# Patient Record
Sex: Female | Born: 1990 | Hispanic: No | Marital: Married
Health system: Northeastern US, Academic
[De-identification: ages and names within clinical notes are randomized; demographics above are authoritative.]

## PROBLEM LIST (undated history)

## (undated) DIAGNOSIS — E88819 Insulin resistance, unspecified: Secondary | ICD-10-CM

## (undated) DIAGNOSIS — E119 Type 2 diabetes mellitus without complications: Secondary | ICD-10-CM

## (undated) DIAGNOSIS — T1491XA Suicide attempt, initial encounter: Secondary | ICD-10-CM

## (undated) DIAGNOSIS — F4310 Post-traumatic stress disorder, unspecified: Secondary | ICD-10-CM

## (undated) DIAGNOSIS — R569 Unspecified convulsions: Secondary | ICD-10-CM

## (undated) DIAGNOSIS — N96 Recurrent pregnancy loss: Secondary | ICD-10-CM

## (undated) DIAGNOSIS — T7411XA Adult physical abuse, confirmed, initial encounter: Secondary | ICD-10-CM

## (undated) DIAGNOSIS — E785 Hyperlipidemia, unspecified: Secondary | ICD-10-CM

## (undated) DIAGNOSIS — R011 Cardiac murmur, unspecified: Secondary | ICD-10-CM

## (undated) DIAGNOSIS — F603 Borderline personality disorder: Secondary | ICD-10-CM

## (undated) DIAGNOSIS — J45909 Unspecified asthma, uncomplicated: Secondary | ICD-10-CM

## (undated) DIAGNOSIS — F32A Depression, unspecified: Secondary | ICD-10-CM

## (undated) DIAGNOSIS — E282 Polycystic ovarian syndrome: Secondary | ICD-10-CM

## (undated) DIAGNOSIS — I10 Essential (primary) hypertension: Secondary | ICD-10-CM

## (undated) DIAGNOSIS — F419 Anxiety disorder, unspecified: Secondary | ICD-10-CM

## (undated) DIAGNOSIS — E8881 Metabolic syndrome: Secondary | ICD-10-CM

## (undated) HISTORY — DX: Depression, unspecified: F32.A

## (undated) HISTORY — DX: Polycystic ovarian syndrome: E28.2

## (undated) HISTORY — DX: Unspecified asthma, uncomplicated: J45.909

## (undated) HISTORY — PX: NO PAST SURGERIES: SHX2092

## (undated) HISTORY — DX: Hyperlipidemia, unspecified: E78.5

## (undated) HISTORY — DX: Recurrent pregnancy loss: N96

## (undated) HISTORY — DX: Anxiety disorder, unspecified: F41.9

## (undated) HISTORY — DX: Insulin resistance, unspecified: E88.819

## (undated) HISTORY — DX: Unspecified convulsions: R56.9

## (undated) HISTORY — DX: Type 2 diabetes mellitus without complications: E11.9

## (undated) HISTORY — DX: Suicide attempt, initial encounter: T14.91XA

## (undated) HISTORY — DX: Borderline personality disorder: F60.3

## (undated) HISTORY — DX: Adult physical abuse, confirmed, initial encounter: T74.11XA

## (undated) HISTORY — DX: Cardiac murmur, unspecified: R01.1

## (undated) HISTORY — DX: Post-traumatic stress disorder, unspecified: F43.10

## (undated) HISTORY — DX: Metabolic syndrome: E88.81

---

## 2013-07-23 DIAGNOSIS — Z8742 Personal history of other diseases of the female genital tract: Secondary | ICD-10-CM | POA: Insufficient documentation

## 2016-02-24 DIAGNOSIS — E88819 Insulin resistance, unspecified: Secondary | ICD-10-CM | POA: Insufficient documentation

## 2016-02-24 DIAGNOSIS — E781 Pure hyperglyceridemia: Secondary | ICD-10-CM | POA: Insufficient documentation

## 2016-02-24 DIAGNOSIS — E8881 Metabolic syndrome: Secondary | ICD-10-CM | POA: Insufficient documentation

## 2016-08-21 DIAGNOSIS — F331 Major depressive disorder, recurrent, moderate: Secondary | ICD-10-CM | POA: Insufficient documentation

## 2016-08-23 DIAGNOSIS — F332 Major depressive disorder, recurrent severe without psychotic features: Secondary | ICD-10-CM

## 2016-08-23 DIAGNOSIS — F419 Anxiety disorder, unspecified: Secondary | ICD-10-CM | POA: Insufficient documentation

## 2016-08-23 DIAGNOSIS — F32A Depression, unspecified: Secondary | ICD-10-CM | POA: Insufficient documentation

## 2016-08-23 DIAGNOSIS — F449 Dissociative and conversion disorder, unspecified: Secondary | ICD-10-CM | POA: Insufficient documentation

## 2016-08-23 HISTORY — DX: Major depressive disorder, recurrent severe without psychotic features: F33.2

## 2017-05-17 DIAGNOSIS — N970 Female infertility associated with anovulation: Secondary | ICD-10-CM | POA: Insufficient documentation

## 2018-08-12 DIAGNOSIS — E282 Polycystic ovarian syndrome: Secondary | ICD-10-CM | POA: Insufficient documentation

## 2018-08-12 DIAGNOSIS — K0889 Other specified disorders of teeth and supporting structures: Secondary | ICD-10-CM | POA: Insufficient documentation

## 2018-08-12 DIAGNOSIS — E119 Type 2 diabetes mellitus without complications: Secondary | ICD-10-CM | POA: Insufficient documentation

## 2018-08-12 DIAGNOSIS — A419 Sepsis, unspecified organism: Secondary | ICD-10-CM

## 2018-08-12 DIAGNOSIS — R221 Localized swelling, mass and lump, neck: Secondary | ICD-10-CM | POA: Insufficient documentation

## 2018-08-12 HISTORY — DX: Sepsis, unspecified organism: A41.9

## 2018-11-06 ENCOUNTER — Encounter: Payer: Self-pay | Admitting: Emergency Medicine

## 2018-11-06 ENCOUNTER — Other Ambulatory Visit: Payer: Self-pay

## 2018-11-06 ENCOUNTER — Emergency Department
Admission: EM | Admit: 2018-11-06 | Discharge: 2018-11-06 | Disposition: A | Discharge status: Home / Self Care | Payer: Self-pay | Attending: Emergency Medicine | Admitting: Emergency Medicine

## 2018-11-06 ENCOUNTER — Emergency Department: Payer: Self-pay

## 2018-11-06 DIAGNOSIS — I10 Essential (primary) hypertension: Secondary | ICD-10-CM | POA: Insufficient documentation

## 2018-11-06 DIAGNOSIS — R42 Dizziness and giddiness: Secondary | ICD-10-CM | POA: Insufficient documentation

## 2018-11-06 DIAGNOSIS — R0600 Dyspnea, unspecified: Secondary | ICD-10-CM | POA: Insufficient documentation

## 2018-11-06 HISTORY — DX: Essential (primary) hypertension: I10

## 2018-11-06 LAB — URINALYSIS, COMPLETE (UACMP) WITH MICROSCOPIC
Bacteria, UA: NONE SEEN
Bilirubin Urine: NEGATIVE
Glucose, UA: NEGATIVE mg/dL
Hgb urine dipstick: NEGATIVE
Ketones, ur: NEGATIVE mg/dL
Leukocytes,Ua: NEGATIVE
Nitrite: NEGATIVE
Protein, ur: 30 mg/dL — AB
Specific Gravity, Urine: 1.02 (ref 1.005–1.030)
pH: 8 (ref 5.0–8.0)

## 2018-11-06 LAB — CBC
HCT: 39.4 % (ref 36.0–46.0)
Hemoglobin: 13 g/dL (ref 12.0–15.0)
MCH: 27 pg (ref 26.0–34.0)
MCHC: 33 g/dL (ref 30.0–36.0)
MCV: 81.9 fL (ref 80.0–100.0)
Platelets: 412 10*3/uL — ABNORMAL HIGH (ref 150–400)
RBC: 4.81 MIL/uL (ref 3.87–5.11)
RDW: 13 % (ref 11.5–15.5)
WBC: 8.5 10*3/uL (ref 4.0–10.5)
nRBC: 0 % (ref 0.0–0.2)

## 2018-11-06 LAB — TROPONIN I (HIGH SENSITIVITY)
Troponin I (High Sensitivity): <2 ng/L (ref <18)
Troponin I (High Sensitivity): <2 ng/L (ref <18)

## 2018-11-06 LAB — BASIC METABOLIC PANEL
Anion gap: 13 (ref 5–15)
BUN: 12 mg/dL (ref 6–20)
CO2: 22 mmol/L (ref 22–32)
Calcium: 9.4 mg/dL (ref 8.9–10.3)
Chloride: 104 mmol/L (ref 98–111)
Creatinine, Ser: 0.62 mg/dL (ref 0.44–1.00)
GFR calc Af Amer: >60 mL/min (ref >60)
GFR calc non Af Amer: >60 mL/min (ref >60)
Glucose, Bld: 147 mg/dL — ABNORMAL HIGH (ref 70–99)
Potassium: 3.5 mmol/L (ref 3.5–5.1)
Sodium: 139 mmol/L (ref 135–145)

## 2018-11-06 LAB — POCT PREGNANCY, URINE: Preg Test, Ur: NEGATIVE

## 2018-11-06 MED ORDER — LORAZEPAM 1 MG PO TABS: 1.0000 mg | ORAL_TABLET | Freq: Two times a day (BID) | ORAL | 0 refills | Status: AC

## 2018-11-06 NOTE — ED Provider Notes (Signed)
[facility] Emergency Department Provider Note       Time seen: ----------------------------------------- 5:27 PM on 11/06/2018 -----------------------------------------   I have reviewed the triage vital signs and the nursing notes.  HISTORY   Chief Complaint Shortness of Breath    HPI Monique Forbes is a 28 y.o. female with a history of hypertension who presents to the ED for shortness of breath, dizziness and lightheadedness for the past 3 days.  Patient is worried that she may have heart disease because of a family history.  She arrives in no acute distress with no pain.  Past Medical History:  Diagnosis Date  . Hypertension     There are no active problems to display for this patient.   History reviewed. No pertinent surgical history.  Allergies Patient has no known allergies.  Social History Social History   Tobacco Use  . Smoking status: Never Smoker  . Smokeless tobacco: Never Used  Substance Use Topics  . Alcohol use: Never    Frequency: Never  . Drug use: Never   Review of Systems Constitutional: Negative for fever. Cardiovascular: Negative for chest pain. Respiratory: Positive for shortness of breath Gastrointestinal: Negative for abdominal pain, vomiting and diarrhea. Musculoskeletal: Negative for back pain. Skin: Negative for rash. Neurological: Negative for headaches, focal weakness or numbness.  Positive for dizziness  All systems negative/normal/unremarkable except as stated in the HPI  ____________________________________________   PHYSICAL EXAM:  VITAL SIGNS: ED Triage Vitals  Enc Vitals Group     BP 11/06/18 1613 (!) 151/88     Pulse Rate 11/06/18 1613 (!) 106     Resp 11/06/18 1613 16     Temp 11/06/18 1613 98.7 F (37.1 C)     Temp Source 11/06/18 1613 Oral     SpO2 11/06/18 1613 98 %     Weight 11/06/18 1609 201 lb (91.2 kg)     Height 11/06/18 1609 5\' 5"  (1.651 m)     Head Circumference --       Peak Flow --      Pain Score 11/06/18 1609 0     Pain Loc --      Pain Edu? --      Excl. in [place]? --    Constitutional: Alert and oriented. Well appearing and in no distress. Eyes: Conjunctivae are normal. Normal extraocular movements. Cardiovascular: Normal rate, regular rhythm. No murmurs, rubs, or gallops. Respiratory: Normal respiratory effort without tachypnea nor retractions. Breath sounds are clear and equal bilaterally. No wheezes/rales/rhonchi. Gastrointestinal: Soft and nontender. Normal bowel sounds Musculoskeletal: Nontender with normal range of motion in extremities. No lower extremity tenderness nor edema. Neurologic:  Normal speech and language. No gross focal neurologic deficits are appreciated.  Skin:  Skin is warm, dry and intact. No rash noted. Psychiatric: Mood and affect are normal. Speech and behavior are normal.  ____________________________________________  EKG: Interpreted by me.  Sinus tachycardia with a rate of 101 bpm, normal PR interval, normal QRS, normal QT, normal axis  ____________________________________________  ED COURSE:  As part of my medical decision making, I reviewed the following data within the electronic MEDICAL RECORD NUMBER History obtained from family if available, nursing notes, old chart and ekg, as well as notes from prior ED visits. Patient presented for shortness of breath, dizziness and lightheadedness, we will assess with labs and imaging as indicated at this time.   Procedures  Monique Forbes was evaluated in Emergency Department on 11/06/2018 for the symptoms described in the history of  present illness. She was evaluated in the context of the global COVID-19 pandemic, which necessitated consideration that the patient might be at risk for infection with the SARS-CoV-2 virus that causes COVID-19. Institutional protocols and algorithms that pertain to the evaluation of patients at risk for COVID-19 are in a state of rapid change based on  information released by regulatory bodies including the CDC and federal and state organizations. These policies and algorithms were followed during the patient's care in the ED.  ____________________________________________   LABS (pertinent positives/negatives)  Labs Reviewed  BASIC METABOLIC PANEL - Abnormal; Notable for the following components:      Result Value   Glucose, Bld 147 (*)    All other components within normal limits  CBC - Abnormal; Notable for the following components:   Platelets 412 (*)    All other components within normal limits  URINALYSIS, COMPLETE (UACMP) WITH MICROSCOPIC  POC URINE PREG, ED  POCT PREGNANCY, URINE  CBG MONITORING, ED  TROPONIN I (HIGH SENSITIVITY)  TROPONIN I (HIGH SENSITIVITY)    RADIOLOGY Images were viewed by me  Chest x-ray IMPRESSION:  No acute cardiopulmonary process.  ____________________________________________   DIFFERENTIAL DIAGNOSIS   Anxiety, PE, pneumothorax, anemia, dehydration, coronary artery disease, GERD  FINAL ASSESSMENT AND PLAN  Dizziness, dyspnea   Plan: The patient had presented for shortness of breath, dizziness and lightheadedness for 3 days. Patient's labs are reassuring. Patient's imaging did not reveal any acute process.  No clear etiology for her symptoms, she is cleared for outpatient follow-up.   Laurence Aly, MD    Note: This note was generated in part or whole with voice recognition software. Voice recognition is usually quite accurate but there are transcription errors that can and very often do occur. I apologize for any typographical errors that were not detected and corrected.     Earleen Newport, MD 11/06/18 [phone]

## 2018-11-06 NOTE — ED Triage Notes (Signed)
Pt in via POV, reports shortness of breath, dizziness, light headedness x 3 days.  Pt concerned due to early onset heart disease in mother and sister, both of which died at 82 and 40 due to heart attack.  NAD noted at this time.

## 2018-11-06 NOTE — ED Notes (Signed)
Pt alert and oriented X4, cooperative, RR even and unlabored, color WNL. Pt in NAD. Ambulates safely. 

## 2019-02-13 ENCOUNTER — Ambulatory Visit: Payer: Self-pay | Admitting: *Deleted

## 2019-02-13 NOTE — Telephone Encounter (Signed)
Pt called stating her BP was 190/110 02/13/2019 at 0800; the reading was taken on her left upper arm with a home cuff; on 02/12/2019 it was 180/105 at 1900  and 170/100 at 1200; she takes amlodipine 2.5 mg daily; the pt says she has intermittent chest pain with the last episode was on 12./05/2018 at 1900; she describes her pain as "a tight feeling that goes to her back"; the pt says that she is not currently having chest pain;  The pt says her BP meds were prescribed by a hospital MD when she was being treated for sepsis; recommendations made per nurse triage protocol; she verbalized understanding; the pt is scheduled at [facility] 03/03/2019; will route to office for notification.  Reason for Disposition . [3] Systolic BP  >= 546 OR Diastolic >= 568  AND [1] having NO cardiac or neurologic symptoms  Answer Assessment - Initial Assessment Questions 1. BLOOD PRESSURE: "What is the blood pressure?" "Did you take at least two measurements 5 minutes apart?"     190/110 and 180/105 2. ONSET: "When did you take your blood pressure?"     02/13/2019 at 0800, and 02/12/2019 at 1900 3. HOW: "How did you obtain the blood pressure?" (e.g., visiting nurse, automatic home BP monitor)  home cuff on left upper armm 4. HISTORY: "Do you have a history of high blood pressure?"     amlodipine 5. MEDICATIONS: "Are you taking any medications for blood pressure?" "Have you missed any doses recently?"    no 6. OTHER SYMPTOMS: "Do you have any symptoms?" (e.g., headache, chest pain, blurred vision, difficulty breathing, weakness)   Intermittent chest pain 7. PREGNANCY: "Is there any chance you are pregnant?" "When was your last menstrual period?"   yes PCOS, LMP 12/14/2018  Protocols used: HIGH BLOOD PRESSURE-A-AH

## 2019-02-26 ENCOUNTER — Emergency Department: Payer: Medicaid Other

## 2019-02-26 ENCOUNTER — Encounter: Payer: Self-pay | Admitting: Emergency Medicine

## 2019-02-26 ENCOUNTER — Other Ambulatory Visit: Payer: Self-pay

## 2019-02-26 ENCOUNTER — Emergency Department
Admission: EM | Admit: 2019-02-26 | Discharge: 2019-02-26 | Discharge status: Against Medical Advice | Payer: Medicaid Other | Attending: Emergency Medicine | Admitting: Emergency Medicine

## 2019-02-26 DIAGNOSIS — I10 Essential (primary) hypertension: Secondary | ICD-10-CM | POA: Insufficient documentation

## 2019-02-26 DIAGNOSIS — Z79899 Other long term (current) drug therapy: Secondary | ICD-10-CM | POA: Insufficient documentation

## 2019-02-26 DIAGNOSIS — R0789 Other chest pain: Secondary | ICD-10-CM | POA: Insufficient documentation

## 2019-02-26 DIAGNOSIS — Z5329 Procedure and treatment not carried out because of patient's decision for other reasons: Secondary | ICD-10-CM | POA: Insufficient documentation

## 2019-02-26 DIAGNOSIS — R519 Headache, unspecified: Secondary | ICD-10-CM | POA: Insufficient documentation

## 2019-02-26 DIAGNOSIS — R0602 Shortness of breath: Secondary | ICD-10-CM | POA: Insufficient documentation

## 2019-02-26 DIAGNOSIS — R079 Chest pain, unspecified: Secondary | ICD-10-CM

## 2019-02-26 LAB — CBC
HCT: 35.9 % — ABNORMAL LOW (ref 36.0–46.0)
Hemoglobin: 12.3 g/dL (ref 12.0–15.0)
MCH: 27.3 pg (ref 26.0–34.0)
MCHC: 34.3 g/dL (ref 30.0–36.0)
MCV: 79.8 fL — ABNORMAL LOW (ref 80.0–100.0)
Platelets: 342 10*3/uL (ref 150–400)
RBC: 4.5 MIL/uL (ref 3.87–5.11)
RDW: 12.3 % (ref 11.5–15.5)
WBC: 8.2 10*3/uL (ref 4.0–10.5)
nRBC: 0 % (ref 0.0–0.2)

## 2019-02-26 LAB — BASIC METABOLIC PANEL
Anion gap: 12 (ref 5–15)
BUN: 10 mg/dL (ref 6–20)
CO2: 22 mmol/L (ref 22–32)
Calcium: 8.9 mg/dL (ref 8.9–10.3)
Chloride: 104 mmol/L (ref 98–111)
Creatinine, Ser: 0.57 mg/dL (ref 0.44–1.00)
GFR calc Af Amer: >60 mL/min (ref >60)
GFR calc non Af Amer: >60 mL/min (ref >60)
Glucose, Bld: 122 mg/dL — ABNORMAL HIGH (ref 70–99)
Potassium: 3.2 mmol/L — ABNORMAL LOW (ref 3.5–5.1)
Sodium: 138 mmol/L (ref 135–145)

## 2019-02-26 LAB — TROPONIN I (HIGH SENSITIVITY)
Troponin I (High Sensitivity): <2 ng/L (ref <18)
Troponin I (High Sensitivity): <2 ng/L (ref <18)

## 2019-02-26 NOTE — ED Triage Notes (Signed)
Pt reports hx of htn and BP at home was 201/200. Pt also reports some CP and SOB. Pt concerned because her mom and sister died early of cardiac problems.

## 2019-02-26 NOTE — ED Notes (Signed)
Requested pt provide urine sample-pt states her daughter is home alone and she wishes to go home. Dr. Charna Archer made aware and he went to speak with the pt.

## 2019-02-26 NOTE — ED Provider Notes (Signed)
[facility] Emergency Department Provider Note   ____________________________________________   First MD Initiated Contact with Patient 02/26/19 1740     (approximate)  I have reviewed the triage vital signs and the nursing notes.   HISTORY  Chief Complaint Hypertension, Chest Pain, and Shortness of Breath    HPI Monique Forbes is a 28 y.o. female with past medical history of hypertension who presents to the ED complaining of elevated blood pressure and chest pain.  Patient reports that she woke up this morning with a diffuse headache as well as a feeling of tightness over the left side of her chest.  Symptoms seem to persist and gradually worsen throughout the day, so she decided to check her blood pressure.  She states her blood pressure was 201/200 at home and she has not missed any doses of her amlodipine.  She wanted to be evaluated in the ED because she states both her mother and sister died in their 26s from heart disease.  After arrival in the ED, she states that her chest pain has resolved and while she was having some difficulty breathing earlier, this is also resolved.  She states she has been feeling otherwise well with no fevers, cough, pain or swelling in her legs.        Past Medical History:  Diagnosis Date  . Hypertension     There are no problems to display for this patient.   History reviewed. No pertinent surgical history.  Prior to Admission medications   Medication Sig Start Date End Date Taking? Authorizing Provider  LORazepam (ATIVAN) 1 MG tablet Take 1 tablet (1 mg total) by mouth 2 (two) times daily. 11/06/18 11/06/19  Earleen Newport, MD    Allergies Patient has no known allergies.  No family history on file.  Social History Social History   Tobacco Use  . Smoking status: Never Smoker  . Smokeless tobacco: Never Used  Substance Use Topics  . Alcohol use: Never  . Drug use: Never    Review of  Systems  Constitutional: No fever/chills Eyes: No visual changes. ENT: No sore throat. Cardiovascular: Positive for chest pain. Respiratory: Positive for shortness of breath. Gastrointestinal: No abdominal pain.  No nausea, no vomiting.  No diarrhea.  No constipation. Genitourinary: Negative for dysuria. Musculoskeletal: Negative for back pain. Skin: Negative for rash. Neurological: Negative for headaches, focal weakness or numbness.  ____________________________________________   PHYSICAL EXAM:  VITAL SIGNS: ED Triage Vitals  Enc Vitals Group     BP 02/26/19 1514 (!) 144/87     Pulse Rate 02/26/19 1514 (!) 102     Resp 02/26/19 1514 19     Temp 02/26/19 1514 99.5 F (37.5 C)     Temp Source 02/26/19 1514 Oral     SpO2 02/26/19 1514 100 %     Weight 02/26/19 1504 206 lb (93.4 kg)     Height 02/26/19 1504 5\' 5"  (1.651 m)     Head Circumference --      Peak Flow --      Pain Score --      Pain Loc --      Pain Edu? --      Excl. in [place]? --     Constitutional: Alert and oriented. Eyes: Conjunctivae are normal. Head: Atraumatic. Nose: No congestion/rhinnorhea. Mouth/Throat: Mucous membranes are moist. Neck: Normal ROM Cardiovascular: Normal rate, regular rhythm. Grossly normal heart sounds.  2+ radial pulses bilaterally. Respiratory: Normal respiratory effort.  No retractions.  Lungs CTAB. Gastrointestinal: Soft and nontender. No distention. Genitourinary: deferred Musculoskeletal: No lower extremity tenderness nor edema. Neurologic:  Normal speech and language. No gross focal neurologic deficits are appreciated. Skin:  Skin is warm, dry and intact. No rash noted. Psychiatric: Mood and affect are normal. Speech and behavior are normal.  ____________________________________________   LABS (all labs ordered are listed, but only abnormal results are displayed)  Labs Reviewed  BASIC METABOLIC PANEL - Abnormal; Notable for the following components:      Result Value    Potassium 3.2 (*)    Glucose, Bld 122 (*)    All other components within normal limits  CBC - Abnormal; Notable for the following components:   HCT 35.9 (*)    MCV 79.8 (*)    All other components within normal limits  POC URINE PREG, ED  TROPONIN I (HIGH SENSITIVITY)  TROPONIN I (HIGH SENSITIVITY)   ____________________________________________  EKG  ED ECG REPORT I, Chesley Noon, the attending physician, personally viewed and interpreted this ECG.   Date: 02/26/2019  EKG Time: 15:08  Rate: 93  Rhythm: normal sinus rhythm  Axis: Normal  Intervals:none  ST&T Change: None    PROCEDURES  Procedure(s) performed (including Critical Care):  Procedures   ____________________________________________   INITIAL IMPRESSION / ASSESSMENT AND PLAN / ED COURSE       28 year old female with history of hypertension and family history of heart disease presents to the ED after waking up this morning with headache and left-sided chest tightness, was concerned about markedly elevated blood pressure at home.  Her blood pressure is improved here in the ED and I would doubt that her blood pressure was truly 201/200 at home.  She states she has been compliant with her prescribed amlodipine.  EKG shows no acute ischemic changes and patient states chest pain has resolved, troponin within normal limits here.  Chest x-ray is negative for acute process and I doubt dissection or PE.  She expressed concerns that she may be pregnant to ED tech, has not yet provided urine sample.  Patient was advised to remain in the ED for repeat troponin to rule out ACS, however she wishes to leave prior to completion of work-up.  She expresses understanding that the risks of this include disability and death given we cannot rule out ACS.  Patient understands and still wishes to sign out AMA.  Counseled patient to follow-up with her PCP and return to the ED for new or worsening symptoms.       ____________________________________________   FINAL CLINICAL IMPRESSION(S) / ED DIAGNOSES  Final diagnoses:  Chest pain, unspecified type  Essential hypertension     ED Discharge Orders    None       Note:  This document was prepared using Dragon voice recognition software and may include unintentional dictation errors.   Chesley Noon, MD 02/26/19 2011

## 2019-02-26 NOTE — ED Notes (Signed)
Pt stating her and boyfriend are wanting to have a child. States she is ovulating and could be pregnant. Pt given urine cup and specimen will be collected.

## 2019-03-02 ENCOUNTER — Ambulatory Visit: Payer: Self-pay | Admitting: *Deleted

## 2019-03-02 NOTE — Telephone Encounter (Signed)
Patient was changed to a telephone virtual visit due to testing positive for COVID. FYI

## 2019-03-02 NOTE — Telephone Encounter (Signed)
Patient is on Adriana schedule to be seen tomorrow, please review message below. KW

## 2019-03-02 NOTE — Telephone Encounter (Signed)
  Patient is calling about elevated BP- patient has just tested + COVID( [facility] Patient does have history of high BP - highest 150/94- patient has been on the medication since June with no f/u. Patient has COVID symptoms- headache, chest pressure. Reason for Disposition . [2] Systolic BP  >= 947 OR Diastolic >= 654 AND [6] cardiac or neurologic symptoms (e.g., chest pain, difficulty breathing, unsteady gait, blurred vision)  Answer Assessment - Initial Assessment Questions 1. BLOOD PRESSURE: "What is the blood pressure?" "Did you take at least two measurements 5 minutes apart?"     191/121, 180/104 2. ONSET: "When did you take your blood pressure?"     8:00 am 3. HOW: "How did you obtain the blood pressure?" (e.g., visiting nurse, automatic home BP monitor)     Automatic cuff 4. HISTORY: "Do you have a history of high blood pressure?"     yes 5. MEDICATIONS: "Are you taking any medications for blood pressure?" "Have you missed any doses recently?"     Yes- no missed doses, amlodipine 2.5 mg  6. OTHER SYMPTOMS: "Do you have any symptoms?" (e.g., headache, chest pain, blurred vision, difficulty breathing, weakness)     chest pressure, headache 7. PREGNANCY: "Is there any chance you are pregnant?" "When was your last menstrual period?"     No- just ended cycle  Protocols used: HIGH BLOOD PRESSURE-A-AH

## 2019-03-03 ENCOUNTER — Telehealth: Payer: Self-pay | Admitting: Physician Assistant

## 2019-03-03 ENCOUNTER — Ambulatory Visit: Payer: Self-pay | Admitting: Physician Assistant

## 2019-03-03 ENCOUNTER — Ambulatory Visit: Payer: Medicaid Other | Admitting: Physician Assistant

## 2019-03-03 NOTE — Telephone Encounter (Signed)
Per agent, Pt called to cancel appt stating she could not pay. Stated she felt "Really bad" and  needed to go to ED; sent to triage but disconnected.  Attempted x 2 to reach pt, left VM to CB.

## 2019-03-03 NOTE — Telephone Encounter (Signed)
From [facility] 

## 2019-03-03 NOTE — Telephone Encounter (Signed)
Noted  

## 2019-03-17 ENCOUNTER — Ambulatory Visit: Payer: Self-pay | Admitting: Adult Health

## 2019-03-23 NOTE — Progress Notes (Signed)
Patient: Monique Forbes, Female    DOB: 1990-07-07, 29 y.o.   MRN: [id number] Visit Date: 03/24/2019  Today's Provider: Jairo Ben, FNP   Chief Complaint  Patient presents with  . New Patient (Initial Visit)   Subjective:    New Patient Devanee Forbes is a 29 y.o. female who presents today for health maintenance and establish care. She feels fairly well patient would like to address past history of hypertension. Patient reports that she was diagnosed in hospital with hypertension about a year ago and states that she was put on Amlodipine 2.5mg  qd. Blood pressure have remained elevated. Patient states that she has not followed back up with cardiology or PCP for this since moving from [place]. She reports she was followed by [facility] she will have records sent to this office. Patient states that she has noticed more frequent headaches in the past few days and states that this morning her blood pressure was 201/113. She reports she is not exercising. She reports she is sleeping fairly well. She is married, She has one daughter.  Anxiety due to family history early cardiac death - sister and mother. Seen in ED 02/26/2019 EKG no ischemic changes and troponin's negative.   Hypertension personal history " for years".  She has been on amlodipine 2.5 mg since June 2020. Denies any edema.  No recent Pap she is overdue. 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 Unknown last date. She denies any unusual vaginal bleeding or vaginal discharge. Takes Metformin for history of PCOS. Patient's last menstrual period was 02/26/2019 (exact date).  She reports irregular cycles with PCOS. 02/26/2019 was short. She denies any chance of pregnancy. She has not been sexually active in past three months she reports.   She tested positive on December 20th. She still has loss of taste and smell still. She has mild shortness of breath  since having covid. She had no shortness of breath prior.    She has some mild chest tightness occasionally when her blood pressure is elevated she notices. She denies any pain and denies any radiation of pain. Blood pressure has been higher since testing positive for covid. Denies any edema.  Prior to Covid she was able to walk without any shortness of breath or chest tightness.   She has been seen by cardiology in [place], and was told all was ok at last visit 2017- provider does not have these records and will patient has signed release to request.  She moved here in 2018. And has only had emergency room care. She recently got insurance.She reports her mother and sister died of MI. Mom at age 6. Sister 56.  She has grandma died stroke, mothers side has multiple cardiology issues. Autopsy of mother and sister showed arterial sclerosis.   She started sleep apnea study at [facility] but did not have it done.  She may want to pursue the sleep study if she can afford but is not interested today. She has mild snoring reported. Denies any apnea.     Patient  denies any fever, body aches,chills, rash, chest pain, shortness of breath, nausea, vomiting, or diarrhea.   Chart Review:  ECG on 12/17 was normal sinus rhythm.  She had sepsis in 2020 June, she had wisdom tooth removed and had an abscess. IMPRESSION:  Redemonstrated marked edema in the right mandibular and submandibular region. There are somewhat more focal confluent areas of  decreased attenuation when compared with 08/11/2018 suggesting phlegmon/abscess formation as described above.  Electronically Signed by:  Tedra Senegalipal Shah, MD, [facility] Electronically Signed on:  08/15/2018 1:16 PM Reviewed x ray from ER visit below. 2020 December X-ray chest single view [facility]12/24/[facility] N[facility]  AP Portable Chest  History: R05 Cough.  Comparison: None.  Technique: An AP view of the chest was  obtained portably.  Findings: Heart size and mediastinal contour are within normal limits. The lungs are clear. No acute osseous abnormalities are identified.  Impression: No acute cardiopulmonary abnormality.  Electronically Signed by: Arvid RightAshley Merritt, MD, [facility] Electronically Signed on: 03/05/2019 1:19 PM  Other Result Information  Interface, Rad Results In - 03/05/2019  1:20 PM EST AP Portable Chest  History: R05 Cough.  Comparison: None.  Technique: An AP view of the chest was obtained portably.  Findings:  Heart size and mediastinal contour are within normal limits. The lungs are clear.  No acute osseous abnormalities are identified.  Impression:  No acute cardiopulmonary abnormality.  Electronically Signed by:  Arvid RightAshley Merritt, MD, [facility] Electronically Signed on:  03/05/2019 1:19 PM    -----------------------------------------------------------------   Review of Systems  Constitutional: Positive for appetite change (no taste / smell since Covid 19. ) and fatigue.  HENT: Negative.   Respiratory: Positive for chest tightness (since Covid diagnosis - none prior reported/ CXR in note was normal. ) and shortness of breath (improving since initial diagniosis with Covid ). Negative for apnea, cough, choking, wheezing and stridor.        Occasional snoring at night.   Cardiovascular: Negative for chest pain, palpitations and leg swelling.  Gastrointestinal: Negative.   Genitourinary: Negative.   Musculoskeletal: Negative.   Skin: Negative.        Denies any changing skin lesions/ or rash   Neurological: Positive for headaches.  Psychiatric/Behavioral: Negative.   All other systems reviewed and are negative.   Social History She  reports that she has never smoked. She has never used smokeless tobacco. She reports that she does not drink alcohol or use drugs. Social History   Socioeconomic History  . Marital status: Unknown    Spouse name: Not  on file  . Number of children: Not on file  . Years of education: Not on file  . Highest education level: Not on file  Occupational History  . Not on file  Tobacco Use  . Smoking status: Never Smoker  . Smokeless tobacco: Never Used  Substance and Sexual Activity  . Alcohol use: Never  . Drug use: Never  . Sexual activity: Not on file  Other Topics Concern  . Not on file  Social History Narrative  . Not on file   Social Determinants of Health   Financial Resource Strain:   . Difficulty of Paying Living Expenses: Not on file  Food Insecurity:   . Worried About Programme researcher, broadcasting/film/videounning Out of Food in the Last Year: Not on file  . Ran Out of Food in the Last Year: Not on file  Transportation Needs:   . Lack of Transportation (Medical): Not on file  . Lack of Transportation (Non-Medical): Not on file  Physical Activity:   . Days of Exercise per Week: Not on file  . Minutes of Exercise per Session: Not on file  Stress:   . Feeling of Stress : Not on file  Social Connections:   . Frequency of Communication with Friends and Family: Not on file  . Frequency  of Social Gatherings with Friends and Family: Not on file  . Attends Religious Services: Not on file  . Active Member of Clubs or Organizations: Not on file  . Attends Banker Meetings: Not on file  . Marital Status: Not on file    There are no problems to display for this patient.   Past Surgical History:  Procedure Laterality Date  . NO PAST SURGERIES      Family History  Family Status  Relation Name Status  . Mother  (Not Specified)  . Father  (Not Specified)  . MGM  (Not Specified)  . MGF  (Not Specified)   Her family history includes Alzheimer's disease in her maternal grandfather and maternal grandmother; Cerebral palsy in her father; Diabetes in her mother; Heart disease in her maternal grandfather and mother; High blood pressure in her father, maternal grandfather, maternal grandmother, and mother; Stroke in  her maternal grandmother.     No Known Allergies  Previous Medications   AMLODIPINE (NORVASC) 2.5 MG TABLET    Take 2.5 mg by mouth daily.   LORAZEPAM (ATIVAN) 1 MG TABLET    Take 1 tablet (1 mg total) by mouth 2 (two) times daily.    Patient Care Team: Berniece Pap, FNP as PCP - General (Family Medicine)      Objective:   Vitals: BP (!) 158/94   Pulse 98   Temp (!) 96.8 F (36 C) (Oral)   Resp 16   Wt 210 lb 12.8 oz (95.6 kg)   LMP 02/26/2019 (Exact Date)   SpO2 98%   BMI 35.08 kg/m    Physical Exam Vitals reviewed.  Constitutional:      General: She is not in acute distress.    Appearance: Normal appearance. She is well-developed. She is obese. She is not ill-appearing, toxic-appearing or diaphoretic.     Interventions: She is not intubated. HENT:     Head: Normocephalic and atraumatic.     Right Ear: Tympanic membrane, ear canal and external ear normal. There is no impacted cerumen.     Left Ear: Tympanic membrane, ear canal and external ear normal. There is no impacted cerumen.     Nose: Nose normal. No congestion or rhinorrhea.     Mouth/Throat:     Mouth: Mucous membranes are moist.     Pharynx: No oropharyngeal exudate or posterior oropharyngeal erythema.  Eyes:     General: Lids are normal. No scleral icterus.       Right eye: No discharge.        Left eye: No discharge.     Conjunctiva/sclera: Conjunctivae normal.     Right eye: Right conjunctiva is not injected. No exudate or hemorrhage.    Left eye: Left conjunctiva is not injected. No exudate or hemorrhage.    Pupils: Pupils are equal, round, and reactive to light.  Neck:     Thyroid: No thyroid mass or thyromegaly.     Vascular: Normal carotid pulses. No carotid bruit, hepatojugular reflux or JVD.     Trachea: Trachea and phonation normal. No tracheal tenderness or tracheal deviation.     Meningeal: Brudzinski's sign and Kernig's sign absent.  Cardiovascular:     Rate and Rhythm: Normal rate  and regular rhythm.     Chest Wall: PMI is not displaced. No thrill.     Pulses: Normal pulses.          Carotid pulses are 2+ on the right side and 2+ on the  left side.      Radial pulses are 2+ on the right side and 2+ on the left side.       Dorsalis pedis pulses are 2+ on the right side and 2+ on the left side.       Posterior tibial pulses are 2+ on the right side and 2+ on the left side.     Heart sounds: S1 normal and S2 normal. Heart sounds not distant. Murmur present. Systolic murmur present with a grade of 2/6. No friction rub. No gallop. No S3 or S4 sounds.   Pulmonary:     Effort: Pulmonary effort is normal. No tachypnea, bradypnea, accessory muscle usage, prolonged expiration, respiratory distress or retractions. She is not intubated.     Breath sounds: Normal breath sounds and air entry. No stridor, decreased air movement or transmitted upper airway sounds. No decreased breath sounds, wheezing, rhonchi or rales.  Chest:     Chest wall: No tenderness.  Abdominal:     General: Bowel sounds are normal. There is no distension or abdominal bruit.     Palpations: Abdomen is soft. There is no shifting dullness, fluid wave, hepatomegaly, splenomegaly, mass or pulsatile mass.     Tenderness: There is no abdominal tenderness. There is no right CVA tenderness, left CVA tenderness, guarding or rebound.     Hernia: No hernia is present.  Genitourinary:    Comments: Declined by patient today, denies any concerns. She has  [place] medicaid family planning and is considering seeing gynecology for PAP/ PCOS. She declined referral today. She will follow up with me if she desires recommend PAP/ exam.  Musculoskeletal:        General: No tenderness or deformity. Normal range of motion.     Cervical back: Full passive range of motion without pain, normal range of motion and neck supple. No edema, erythema or rigidity. No spinous process tenderness or muscular tenderness. Normal range of motion.     Right  lower leg: No edema.     Left lower leg: No edema.  Lymphadenopathy:     Head:     Right side of head: No submental, submandibular, tonsillar, preauricular, posterior auricular or occipital adenopathy.     Left side of head: No submental, submandibular, tonsillar, preauricular, posterior auricular or occipital adenopathy.     Cervical: No cervical adenopathy.     Right cervical: No superficial, deep or posterior cervical adenopathy.    Left cervical: No superficial, deep or posterior cervical adenopathy.     Upper Body:     Right upper body: No supraclavicular or pectoral adenopathy.     Left upper body: No supraclavicular or pectoral adenopathy.  Skin:    General: Skin is warm and dry.     Coloration: Skin is not pale.     Findings: No abrasion, bruising, burn, ecchymosis, erythema, lesion, petechiae or rash.     Nails: There is no clubbing.  Neurological:     Mental Status: She is alert and oriented to person, place, and time.     GCS: GCS eye subscore is 4. GCS verbal subscore is 5. GCS motor subscore is 6.     Cranial Nerves: No cranial nerve deficit.     Sensory: No sensory deficit.     Motor: No tremor, atrophy, abnormal muscle tone or seizure activity.     Coordination: Coordination normal.     Gait: Gait normal.     Deep Tendon Reflexes: Reflexes are normal and symmetric. Reflexes  normal. Babinski sign absent on the right side. Babinski sign absent on the left side.     Reflex Scores:      Tricep reflexes are 2+ on the right side and 2+ on the left side.      Bicep reflexes are 2+ on the right side and 2+ on the left side.      Brachioradialis reflexes are 2+ on the right side and 2+ on the left side.      Patellar reflexes are 2+ on the right side and 2+ on the left side.      Achilles reflexes are 2+ on the right side and 2+ on the left side. Psychiatric:        Speech: Speech normal.        Behavior: Behavior normal.        Thought Content: Thought content normal.         Judgment: Judgment normal.      Depression Screen PHQ 2/9 Scores 03/24/2019  PHQ - 2 Score 0  PHQ- 9 Score 0      Assessment & Plan:     Routine Health Maintenance and Physical Exam  Exercise Activities and Dietary recommendations Goals   None      There is no immunization history on file for this patient.  Health Maintenance  Topic Date Due  . URINE MICROALBUMIN  10/25/1998  . HIV Screening  10/25/2003  . TETANUS/TDAP  10/25/2007  . PAP SMEAR-Modifier  10/24/2009  . INFLUENZA VACCINE  10/11/2018     Discussed health benefits of physical activity, and encouraged her to engage in regular exercise appropriate for her age and condition.    -------------------------------------------------------------------- 1. Hypertension, unspecified type Meds ordered this encounter  Medications  . amLODipine (NORVASC) 5 MG tablet    Sig: Take 1 tablet (5 mg total) by mouth daily.    Dispense:  45 tablet    Refill:  0  . aspirin EC 81 MG tablet    Sig: Take 1 tablet (81 mg total) by mouth daily.    Dispense:  90 tablet    Refill:  0   Increase medication as above, discontinued dose as below. Follow up recheck in office 2-3 weeks for hypertension. Discussed possible side effects.Patient is in agreement with plan. Keep blood pressure log.  Medications Discontinued During This Encounter  Medication Reason  . amLODipine (NORVASC) 2.5 MG tablet Completed Course   - CBC with Differential/Platelet 005009 - Comprehensive Metabolic Panel (CMET) - TSH - Lipid Panel w/o Chol/HDL Ratio - EKG 12-Lead - Ambulatory referral to Cardiology  2. Personal history of covid-19 Tested positive 03/10/2020 - CBC with Differential/Platelet 005009 - Comprehensive Metabolic Panel (CMET) - EKG 12-Lead - DG Chest 2 View; Future  3. Shortness of breath Post covid.   Orders Placed This Encounter  Procedures  . DG Chest 2 View    Standing Status:   Future    Standing Expiration Date:    04/24/2019    Order Specific Question:   Reason for Exam (SYMPTOM  OR DIAGNOSIS REQUIRED)    Answer:   post covid 12/30 shortness of breath persistent since covid only.    Order Specific Question:   Is patient pregnant?    Answer:   No    Comments:   denies any chance     Order Specific Question:   Preferred imaging location?    Answer:   [facility]    Order Specific Question:   Radiology Contrast Protocol -  do NOT remove file path    Answer:   [email or web address]  . CBC with Differential/Platelet 005009  . Comprehensive Metabolic Panel (CMET)  . TSH  . Lipid Panel w/o Chol/HDL Ratio  . Ambulatory referral to Cardiology    Referral Priority:   Urgent    Referral Type:   Consultation    Referral Reason:   Specialty Services Required    Requested Specialty:   Cardiology    Number of Visits Requested:   1  . POCT urine pregnancy  . EKG 12-Lead   - CBC with Differential/Platelet 005009 - Comprehensive Metabolic Panel (CMET) - TSH - EKG 12-Lead - DG Chest 2 View; Future  4. 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 Also healthy lifestyle/ diet. - CBC with Differential/Platelet 005009 - Comprehensive Metabolic Panel (CMET) - TSH - Lipid Panel w/o Chol/HDL Ratio - EKG 12-Lead - Ambulatory referral to Cardiology  5. Family history of sudden cardiac death in mother  - CBC with Differential/Platelet [phone] - Comprehensive Metabolic Panel (CMET) - TSH - Lipid Panel w/o Chol/HDL Ratio - EKG 12-Lead - Ambulatory referral to Cardiology  6. Family history of sudden cardiac death in sister Meds ordered this encounter  Medications  . amLODipine (NORVASC) 5 MG tablet    Sig: Take 1 tablet (5 mg total) by mouth daily.    Dispense:  45 tablet    Refill:  0  . aspirin EC 81 MG tablet    Sig: Take 1 tablet (81 mg  total) by mouth daily.    Dispense:  90 tablet    Refill:  0   - CBC with Differential/Platelet 005009 - Comprehensive Metabolic Panel (CMET) - TSH - Lipid Panel w/o Chol/HDL Ratio - EKG 12-Lead - Ambulatory referral to Cardiology  7. Heart murmur Known since childhood, was previously followed at [facility] she will send records from her cardiologist. She is unsure if she has ever had an ECHO. - CBC with Differential/Platelet 005009 - Comprehensive Metabolic Panel (CMET) - TSH - Lipid Panel w/o Chol/HDL Ratio - EKG 12-Lead - Ambulatory referral to Cardiology  8. Screening for thyroid disorder  - TSH  9. Screening for hyperlipidemia May need statin therapy.  - CBC with Differential/Platelet 005009 - Comprehensive Metabolic Panel (CMET) - TSH - Lipid Panel w/o Chol/HDL Ratio - EKG 12-Lead - Ambulatory referral to Cardiology  10. History of polycystic ovarian disease She has family planning medicaid and will go to Gynecology. She will let me know if she needs a referral. Declines PAP today.  - POCT urine pregnancy  Cardiology referral:  Reason for Referral: reestablish care with cardiology/ family history mother and sister deceased in 71's / moved from [place] in 2018 reports she was followed by [facility] cardiology. Will send records she signed release.  Referral discussed with patient:Yes  Best contact number of patient for referral team: [phone] Has patient been seen by a specialist for this issue before: yes . If so, who (practice/provider): [facility] cardiology unknown provider  . Does the patient wish to return: no. Needs new provider since moving to [facility]  Patient provider preference for referral: provider and patient Patient location preference for referral: [place]  area.    Return in about 2 weeks (around 04/07/2019), or if symptoms worsen or fail to improve, for at any time for any worsening symptoms, Go to Emergency room/ urgent care if worse.   Marland Kitchen  The entirety of the  information documented in the History of Present Illness, Review of Systems and Physical Exam were personally obtained by me. Portions of this information were initially documented by the  Certified Medical Assistant whose name is documented in [place] and reviewed by me for thoroughness and accuracy.  I have personally performed the exam and reviewed the chart and it is accurate to the best of my knowledge.  Haematologist has been used and any errors in dictation or transcription are unintentional.  Kelby Aline. [employer]

## 2019-03-24 ENCOUNTER — Other Ambulatory Visit: Payer: Self-pay

## 2019-03-24 ENCOUNTER — Encounter: Payer: Self-pay | Admitting: Adult Health

## 2019-03-24 ENCOUNTER — Ambulatory Visit (INDEPENDENT_AMBULATORY_CARE_PROVIDER_SITE_OTHER): Payer: BLUE CROSS/BLUE SHIELD | Admitting: Adult Health

## 2019-03-24 VITALS — BP 158/94 | HR 98 | Temp 96.8°F | Resp 16 | Wt 210.8 lb

## 2019-03-24 DIAGNOSIS — R0602 Shortness of breath: Secondary | ICD-10-CM

## 2019-03-24 DIAGNOSIS — Z1329 Encounter for screening for other suspected endocrine disorder: Secondary | ICD-10-CM

## 2019-03-24 DIAGNOSIS — Z6835 Body mass index (BMI) 35.0-35.9, adult: Secondary | ICD-10-CM

## 2019-03-24 DIAGNOSIS — Z8616 Personal history of COVID-19: Secondary | ICD-10-CM

## 2019-03-24 DIAGNOSIS — Z8742 Personal history of other diseases of the female genital tract: Secondary | ICD-10-CM

## 2019-03-24 DIAGNOSIS — Z13220 Encounter for screening for lipoid disorders: Secondary | ICD-10-CM

## 2019-03-24 DIAGNOSIS — R011 Cardiac murmur, unspecified: Secondary | ICD-10-CM

## 2019-03-24 DIAGNOSIS — E6609 Other obesity due to excess calories: Secondary | ICD-10-CM | POA: Diagnosis not present

## 2019-03-24 DIAGNOSIS — I10 Essential (primary) hypertension: Secondary | ICD-10-CM

## 2019-03-24 DIAGNOSIS — Z8241 Family history of sudden cardiac death: Secondary | ICD-10-CM

## 2019-03-24 MED ORDER — ASPIRIN EC 81 MG PO TBEC: 81.0000 mg | DELAYED_RELEASE_TABLET | Freq: Every day | ORAL | 0 refills | Status: DC

## 2019-03-24 MED ORDER — AMLODIPINE BESYLATE 5 MG PO TABS: 5.0000 mg | ORAL_TABLET | Freq: Every day | ORAL | 0 refills | Status: DC

## 2019-03-24 NOTE — Progress Notes (Deleted)
New Patient Office Visit  Subjective:  Patient ID: Monique Forbes, female    DOB: [birth year]  Age: 29 y.o. MRN: [id number]  CC:  Chief Complaint  Patient presents with  . New Patient (Initial Visit)    HPI Monique Forbes presents for ***  Past Medical History:  Diagnosis Date  . Asthma   . Diabetes mellitus without complication (HCC)   . Heart murmur   . Hypertension     Past Surgical History:  Procedure Laterality Date  . NO PAST SURGERIES      Family History  Problem Relation Age of Onset  . Diabetes Mother   . High blood pressure Mother   . Heart disease Mother   . High blood pressure Father   . Cerebral palsy Father   . Alzheimer's disease Maternal Grandmother   . High blood pressure Maternal Grandmother   . Stroke Maternal Grandmother   . Alzheimer's disease Maternal Grandfather   . High blood pressure Maternal Grandfather   . Heart disease Maternal Grandfather     Social History   Socioeconomic History  . Marital status: Unknown    Spouse name: Not on file  . Number of children: Not on file  . Years of education: Not on file  . Highest education level: Not on file  Occupational History  . Not on file  Tobacco Use  . Smoking status: Never Smoker  . Smokeless tobacco: Never Used  Substance and Sexual Activity  . Alcohol use: Never  . Drug use: Never  . Sexual activity: Not on file  Other Topics Concern  . Not on file  Social History Narrative  . Not on file   Social Determinants of Health   Financial Resource Strain:   . Difficulty of Paying Living Expenses: Not on file  Food Insecurity:   . Worried About Programme researcher, broadcasting/film/video in the Last Year: Not on file  . Ran Out of Food in the Last Year: Not on file  Transportation Needs:   . Lack of Transportation (Medical): Not on file  . Lack of Transportation (Non-Medical): Not on file  Physical Activity:   . Days of Exercise per Week: Not on file  . Minutes of Exercise per Session: Not on  file  Stress:   . Feeling of Stress : Not on file  Social Connections:   . Frequency of Communication with Friends and Family: Not on file  . Frequency of Social Gatherings with Friends and Family: Not on file  . Attends Religious Services: Not on file  . Active Member of Clubs or Organizations: Not on file  . Attends Banker Meetings: Not on file  . Marital Status: Not on file  Intimate Partner Violence:   . Fear of Current or Ex-Partner: Not on file  . Emotionally Abused: Not on file  . Physically Abused: Not on file  . Sexually Abused: Not on file    ROS Review of Systems  Objective:   Today's Vitals: BP (!) 158/94   Pulse 98   Temp (!) 96.8 F (36 C) (Oral)   Resp 16   Wt 210 lb 12.8 oz (95.6 kg)   LMP 02/26/2019 (Exact Date)   SpO2 98%   BMI 35.08 kg/m   Physical Exam  Assessment & Plan:   Problem List Items Addressed This Visit    None    Visit Diagnoses    Hypertension, unspecified type    -  Primary   Relevant Medications  amLODipine (NORVASC) 5 MG tablet   aspirin EC 81 MG tablet   Other Relevant Orders   CBC with Differential/Platelet 005009   Comprehensive Metabolic Panel (CMET)   TSH   Lipid Panel w/o Chol/HDL Ratio   EKG 12-Lead   Ambulatory referral to Cardiology   Personal history of covid-19       Relevant Orders   CBC with Differential/Platelet [id number]   Comprehensive Metabolic Panel (CMET)   EKG 12-Lead   DG Chest 2 View   Shortness of breath       Relevant Orders   CBC with Differential/Platelet [id number]   Comprehensive Metabolic Panel (CMET)   TSH   EKG 12-Lead   DG Chest 2 View   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, unspecified whether serious comorbidity present       Relevant Orders   CBC with Differential/Platelet [id number]   Comprehensive Metabolic Panel (CMET)   TSH   Lipid Panel w/o Chol/HDL Ratio   EKG 12-Lead   Ambulatory referral to Cardiology   Family  history of sudden cardiac death in mother       Relevant Orders   CBC with Differential/Platelet [id number]   Comprehensive Metabolic Panel (CMET)   TSH   Lipid Panel w/o Chol/HDL Ratio   EKG 12-Lead   Ambulatory referral to Cardiology   Family history of sudden cardiac death in sister       Relevant Orders   CBC with Differential/Platelet [id number]   Comprehensive Metabolic Panel (CMET)   TSH   Lipid Panel w/o Chol/HDL Ratio   EKG 12-Lead   Ambulatory referral to Cardiology   Heart murmur       Relevant Orders   CBC with Differential/Platelet [id number]   Comprehensive Metabolic Panel (CMET)   TSH   Lipid Panel w/o Chol/HDL Ratio   EKG 12-Lead   Ambulatory referral to Cardiology   Screening for thyroid disorder       Relevant Orders   TSH   Screening for hyperlipidemia       Relevant Orders   CBC with Differential/Platelet [id number]   Comprehensive Metabolic Panel (CMET)   TSH   Lipid Panel w/o Chol/HDL Ratio   EKG 12-Lead   Ambulatory referral to Cardiology   History of polycystic ovarian disease       Relevant Orders   POCT urine pregnancy      Outpatient Encounter Medications as of 03/24/2019  Medication Sig  . [DISCONTINUED] amLODipine (NORVASC) 2.5 MG tablet Take 2.5 mg by mouth daily.  Marland Kitchen amLODipine (NORVASC) 5 MG tablet Take 1 tablet (5 mg total) by mouth daily.  Marland Kitchen aspirin EC 81 MG tablet Take 1 tablet (81 mg total) by mouth daily.  Marland Kitchen LORazepam (ATIVAN) 1 MG tablet Take 1 tablet (1 mg total) by mouth 2 (two) times daily. (Patient not taking: Reported on 03/24/2019)   No facility-administered encounter medications on file as of 03/24/2019.    Follow-up: Return in about 2 weeks (around 04/07/2019), or if symptoms worsen or fail to improve, for at any time for any worsening symptoms, Go to Emergency room/ urgent care if worse.   Marcille Buffy, FNP

## 2019-03-24 NOTE — Patient Instructions (Signed)
Amlodipine Oral Tablets What is this medicine? AMLODIPINE (am LOE di peen) is a calcium channel blocker. It relaxes your blood vessels and decreases the amount of work the heart has to do. It treats high blood pressure and/or prevents chest pain (also called angina). This medicine may be used for other purposes; ask your health care provider or pharmacist if you have questions. COMMON BRAND NAME(S): Norvasc What should I tell my health care provider before I take this medicine? They need to know if you have any of these conditions:  heart disease  liver disease  an unusual or allergic reaction to amlodipine, other drugs, foods, dyes, or preservatives  pregnant or trying to get pregnant  breast-feeding How should I use this medicine? Take this drug by mouth. Take it as directed on the prescription label at the same time every day. You can take it with or without food. If it upsets your stomach, take it with food. Keep taking it unless your health care provider tells you to stop. Talk to your health care provider about the use of this drug in children. While it may be prescribed for children as young as 6 for selected conditions, precautions do apply. Overdosage: If you think you have taken too much of this medicine contact a poison control center or emergency room at once. NOTE: This medicine is only for you. Do not share this medicine with others. What if I miss a dose? If you miss a dose, take it as soon as you can. If it is almost time for your next dose, take only that dose. Do not take double or extra doses. What may interact with this medicine? This medicine may interact with the following medications:  clarithromycin  cyclosporine  diltiazem  itraconazole  simvastatin  tacrolimus This list may not describe all possible interactions. Give your health care provider a list of all the medicines, herbs, non-prescription drugs, or dietary supplements you use. Also tell them if  you smoke, drink alcohol, or use illegal drugs. Some items may interact with your medicine. What should I watch for while using this medicine? Visit your health care provider for regular checks on your progress. Check your blood pressure as directed. Ask your health care provider what your blood pressure should be. Also, find out when you should contact him or her. Do not treat yourself for coughs, colds, or pain while you are using this drug without asking your health care provider for advice. Some drugs may increase your blood pressure. You may get drowsy or dizzy. Do not drive, use machinery, or do anything that needs mental alertness until you know how this drug affects you. Do not stand up or sit up quickly, especially if you are an older patient. This reduces the risk of dizzy or fainting spells. What side effects may I notice from receiving this medicine? Side effects that you should report to your doctor or health care provider as soon as possible:  allergic reactions (skin rash, itching or hives; swelling of the face, lips, or tongue)  heart attack (trouble breathing; pain or tightness in the chest, neck, back or arms; unusually weak or tired)  low blood pressure (dizziness; feeling faint or lightheaded, falls; unusually weak or tired) Side effects that usually do not require medical attention (report these to your doctor or health care provider if they continue or are bothersome):  facial flushing  nausea  palpitations  stomach pain  sudden weight gain  swelling of the ankles, feet, hands  This list may not describe all possible side effects. Call your doctor for medical advice about side effects. You may report side effects to FDA at 1-800-FDA-1088. Where should I keep my medicine? Keep out of the reach of children and pets. Store at room temperature between 59 and 86 degrees F (15 and 30 degrees C). Protect from light and moisture. Keep the container tightly closed. Throw away  any unused drug after the expiration date. NOTE: This sheet is a summary. It may not cover all possible information. If you have questions about this medicine, talk to your doctor, pharmacist, or health care provider.  2020 Elsevier/Gold Standard (2018-12-02 19:39:45) Hypertension, Adult Hypertension is another name for high blood pressure. High blood pressure forces your heart to work harder to pump blood. This can cause problems over time. There are two numbers in a blood pressure reading. There is a top number (systolic) over a bottom number (diastolic). It is best to have a blood pressure that is below 120/80. Healthy choices can help lower your blood pressure, or you may need medicine to help lower it. What are the causes? The cause of this condition is not known. Some conditions may be related to high blood pressure. What increases the risk?  Smoking.  Having type 2 diabetes mellitus, high cholesterol, or both.  Not getting enough exercise or physical activity.  Being overweight.  Having too much fat, sugar, calories, or salt (sodium) in your diet.  Drinking too much alcohol.  Having long-term (chronic) kidney disease.  Having a family history of high blood pressure.  Age. Risk increases with age.  Race. You may be at higher risk if you are African American.  Gender. Men are at higher risk than women before age 31. After age 84, women are at higher risk than men.  Having obstructive sleep apnea.  Stress. What are the signs or symptoms?  High blood pressure may not cause symptoms. Very high blood pressure (hypertensive crisis) may cause: ? Headache. ? Feelings of worry or nervousness (anxiety). ? Shortness of breath. ? Nosebleed. ? A feeling of being sick to your stomach (nausea). ? Throwing up (vomiting). ? Changes in how you see. ? Very bad chest pain. ? Seizures. How is this treated?  This condition is treated by making healthy lifestyle changes, such  as: ? Eating healthy foods. ? Exercising more. ? Drinking less alcohol.  Your health care provider may prescribe medicine if lifestyle changes are not enough to get your blood pressure under control, and if: ? Your top number is above 130. ? Your bottom number is above 80.  Your personal target blood pressure may vary. Follow these instructions at home: Eating and drinking   If told, follow the DASH eating plan. To follow this plan: ? Fill one half of your plate at each meal with fruits and vegetables. ? Fill one fourth of your plate at each meal with whole grains. Whole grains include whole-wheat pasta, brown rice, and whole-grain bread. ? Eat or drink low-fat dairy products, such as skim milk or low-fat yogurt. ? Fill one fourth of your plate at each meal with low-fat (lean) proteins. Low-fat proteins include fish, chicken without skin, eggs, beans, and tofu. ? Avoid fatty meat, cured and processed meat, or chicken with skin. ? Avoid pre-made or processed food.  Eat less than 1,500 mg of salt each day.  Do not drink alcohol if: ? Your doctor tells you not to drink. ? You are pregnant, may be  pregnant, or are planning to become pregnant.  If you drink alcohol: ? Limit how much you use to:  0-1 drink a day for women.  0-2 drinks a day for men. ? Be aware of how much alcohol is in your drink. In the [place] one drink equals one 12 oz bottle of beer (355 mL), one 5 oz glass of wine (148 mL), or one 1 oz glass of hard liquor (44 mL). Lifestyle   Work with your doctor to stay at a healthy weight or to lose weight. Ask your doctor what the best weight is for you.  Get at least 30 minutes of exercise most days of the week. This may include walking, swimming, or biking.  Get at least 30 minutes of exercise that strengthens your muscles (resistance exercise) at least 3 days a week. This may include lifting weights or doing Pilates.  Do not use any products that contain nicotine or  tobacco, such as cigarettes, e-cigarettes, and chewing tobacco. If you need help quitting, ask your doctor.  Check your blood pressure at home as told by your doctor.  Keep all follow-up visits as told by your doctor. This is important. Medicines  Take over-the-counter and prescription medicines only as told by your doctor. Follow directions carefully.  Do not skip doses of blood pressure medicine. The medicine does not work as well if you skip doses. Skipping doses also puts you at risk for problems.  Ask your doctor about side effects or reactions to medicines that you should watch for. Contact a doctor if you:  Think you are having a reaction to the medicine you are taking.  Have headaches that keep coming back (recurring).  Feel dizzy.  Have swelling in your ankles.  Have trouble with your vision. Get help right away if you:  Get a very bad headache.  Start to feel mixed up (confused).  Feel weak or numb.  Feel faint.  Have very bad pain in your: ? Chest. ? Belly (abdomen).  Throw up more than once.  Have trouble breathing. Summary  Hypertension is another name for high blood pressure.  High blood pressure forces your heart to work harder to pump blood.  For most people, a normal blood pressure is less than 120/80.  Making healthy choices can help lower blood pressure. If your blood pressure does not get lower with healthy choices, you may need to take medicine. This information is not intended to replace advice given to you by your health care provider. Make sure you discuss any questions you have with your health care provider. Document Revised: 11/06/2017 Document Reviewed: 11/06/2017 Elsevier Patient Education  2020 [employer] Shortness of Breath, Adult Shortness of breath is when a person has trouble breathing enough air or when a person feels like she or he is having trouble breathing in enough air. Shortness of breath could be a sign of a medical  problem. Follow these instructions at home:   Pay attention to any changes in your symptoms.  Do not use any products that contain nicotine or tobacco, such as cigarettes, e-cigarettes, and chewing tobacco.  Do not smoke. Smoking is a common cause of shortness of breath. If you need help quitting, ask your health care provider.  Avoid things that can irritate your airways, such as: ? Mold. ? Dust. ? Air pollution. ? Chemical fumes. ? Things that can cause allergy symptoms (allergens), if you have allergies.  Keep your living space clean and free of mold and  dust.  Rest as needed. Slowly return to your usual activities.  Take over-the-counter and prescription medicines only as told by your health care provider. This includes oxygen therapy and inhaled medicines.  Keep all follow-up visits as told by your health care provider. This is important. Contact a health care provider if:  Your condition does not improve as soon as expected.  You have a hard time doing your normal activities, even after you rest.  You have new symptoms. Get help right away if:  Your shortness of breath gets worse.  You have shortness of breath when you are resting.  You feel light-headed or you faint.  You have a cough that is not controlled with medicines.  You cough up blood.  You have pain with breathing.  You have pain in your chest, arms, shoulders, or abdomen.  You have a fever.  You cannot walk up stairs or exercise the way that you normally do. These symptoms may represent a serious problem that is an emergency. Do not wait to see if the symptoms will go away. Get medical help right away. Call your local emergency services (911 in the [place] Do not drive yourself to the hospital. Summary  Shortness of breath is when a person has trouble breathing enough air. It can be a sign of a medical problem.  Avoid things that irritate your lungs, such as smoking, pollution, mold, and  dust.  Pay attention to changes in your symptoms and contact your health care provider if you have a hard time completing daily activities because of shortness of breath. This information is not intended to replace advice given to you by your health care provider. Make sure you discuss any questions you have with your health care provider. Document Revised: 07/29/2017 Document Reviewed: 07/29/2017 Elsevier Patient Education  2020 [place]. COVID-19 COVID-19 is a respiratory infection that is caused by a virus called severe acute respiratory syndrome coronavirus 2 (SARS-CoV-2). The disease is also known as coronavirus disease or novel coronavirus. In some people, the virus may not cause any symptoms. In others, it may cause a serious infection. The infection can get worse quickly and can lead to complications, such as:  Pneumonia, or infection of the lungs.  Acute respiratory distress syndrome or ARDS. This is a condition in which fluid build-up in the lungs prevents the lungs from filling with air and passing oxygen into the blood.  Acute respiratory failure. This is a condition in which there is not enough oxygen passing from the lungs to the body or when carbon dioxide is not passing from the lungs out of the body.  Sepsis or septic shock. This is a serious bodily reaction to an infection.  Blood clotting problems.  Secondary infections due to bacteria or fungus.  Organ failure. This is when your body's organs stop working. The virus that causes COVID-19 is contagious. This means that it can spread from person to person through droplets from coughs and sneezes (respiratory secretions). What are the causes? This illness is caused by a virus. You may catch the virus by:  Breathing in droplets from an infected person. Droplets can be spread by a person breathing, speaking, singing, coughing, or sneezing.  Touching something, like a table or a doorknob, that was exposed to the virus  (contaminated) and then touching your mouth, nose, or eyes. What increases the risk? Risk for infection You are more likely to be infected with this virus if you:  Are within 6 feet (2  meters) of a person with COVID-19.  Provide care for or live with a person who is infected with COVID-19.  Spend time in crowded indoor spaces or live in shared housing. Risk for serious illness You are more likely to become seriously ill from the virus if you:  Are 31 years of age or older. The higher your age, the more you are at risk for serious illness.  Live in a nursing home or long-term care facility.  Have cancer.  Have a long-term (chronic) disease such as: ? Chronic lung disease, including chronic obstructive pulmonary disease or asthma. ? A long-term disease that lowers your body's ability to fight infection (immunocompromised). ? Heart disease, including heart failure, a condition in which the arteries that lead to the heart become narrow or blocked (coronary artery disease), a disease which makes the heart muscle thick, weak, or stiff (cardiomyopathy). ? Diabetes. ? Chronic kidney disease. ? Sickle cell disease, a condition in which red blood cells have an abnormal "sickle" shape. ? Liver disease.  Are obese. What are the signs or symptoms? Symptoms of this condition can range from mild to severe. Symptoms may appear any time from 2 to 14 days after being exposed to the virus. They include:  A fever or chills.  A cough.  Difficulty breathing.  Headaches, body aches, or muscle aches.  Runny or stuffy (congested) nose.  A sore throat.  New loss of taste or smell. Some people may also have stomach problems, such as nausea, vomiting, or diarrhea. Other people may not have any symptoms of COVID-19. How is this diagnosed? This condition may be diagnosed based on:  Your signs and symptoms, especially if: ? You live in an area with a COVID-19 outbreak. ? You recently traveled to  or from an area where the virus is common. ? You provide care for or live with a person who was diagnosed with COVID-19. ? You were exposed to a person who was diagnosed with COVID-19.  A physical exam.  Lab tests, which may include: ? Taking a sample of fluid from the back of your nose and throat (nasopharyngeal fluid), your nose, or your throat using a swab. ? A sample of mucus from your lungs (sputum). ? Blood tests.  Imaging tests, which may include, X-rays, CT scan, or ultrasound. How is this treated? At present, there is no medicine to treat COVID-19. Medicines that treat other diseases are being used on a trial basis to see if they are effective against COVID-19. Your health care provider will talk with you about ways to treat your symptoms. For most people, the infection is mild and can be managed at home with rest, fluids, and over-the-counter medicines. Treatment for a serious infection usually takes places in a hospital intensive care unit (ICU). It may include one or more of the following treatments. These treatments are given until your symptoms improve.  Receiving fluids and medicines through an IV.  Supplemental oxygen. Extra oxygen is given through a tube in the nose, a face mask, or a hood.  Positioning you to lie on your stomach (prone position). This makes it easier for oxygen to get into the lungs.  Continuous positive airway pressure (CPAP) or bi-level positive airway pressure (BPAP) machine. This treatment uses mild air pressure to keep the airways open. A tube that is connected to a motor delivers oxygen to the body.  Ventilator. This treatment moves air into and out of the lungs by using a tube that is placed  in your windpipe.  Tracheostomy. This is a procedure to create a hole in the neck so that a breathing tube can be inserted.  Extracorporeal membrane oxygenation (ECMO). This procedure gives the lungs a chance to recover by taking over the functions of the  heart and lungs. It supplies oxygen to the body and removes carbon dioxide. Follow these instructions at home: Lifestyle  If you are sick, stay home except to get medical care. Your health care provider will tell you how long to stay home. Call your health care provider before you go for medical care.  Rest at home as told by your health care provider.  Do not use any products that contain nicotine or tobacco, such as cigarettes, e-cigarettes, and chewing tobacco. If you need help quitting, ask your health care provider.  Return to your normal activities as told by your health care provider. Ask your health care provider what activities are safe for you. General instructions  Take over-the-counter and prescription medicines only as told by your health care provider.  Drink enough fluid to keep your urine pale yellow.  Keep all follow-up visits as told by your health care provider. This is important. How is this prevented?  There is no vaccine to help prevent COVID-19 infection. However, there are steps you can take to protect yourself and others from this virus. To protect yourself:   Do not travel to areas where COVID-19 is a risk. The areas where COVID-19 is reported change often. To identify high-risk areas and travel restrictions, check the CDC travel website: StageSync.si  If you live in, or must travel to, an area where COVID-19 is a risk, take precautions to avoid infection. ? Stay away from people who are sick. ? Wash your hands often with soap and water for 20 seconds. If soap and water are not available, use an alcohol-based hand sanitizer. ? Avoid touching your mouth, face, eyes, or nose. ? Avoid going out in public, follow guidance from your state and local health authorities. ? If you must go out in public, wear a cloth face covering or face mask. Make sure your mask covers your nose and mouth. ? Avoid crowded indoor spaces. Stay at least 6 feet (2 meters)  away from others. ? Disinfect objects and surfaces that are frequently touched every day. This may include:  Counters and tables.  Doorknobs and light switches.  Sinks and faucets.  Electronics, such as phones, remote controls, keyboards, computers, and tablets. To protect others: If you have symptoms of COVID-19, take steps to prevent the virus from spreading to others.  If you think you have a COVID-19 infection, contact your health care provider right away. Tell your health care team that you think you may have a COVID-19 infection.  Stay home. Leave your house only to seek medical care. Do not use public transport.  Do not travel while you are sick.  Wash your hands often with soap and water for 20 seconds. If soap and water are not available, use alcohol-based hand sanitizer.  Stay away from other members of your household. Let healthy household members care for children and pets, if possible. If you have to care for children or pets, wash your hands often and wear a mask. If possible, stay in your own room, separate from others. Use a different bathroom.  Make sure that all people in your household wash their hands well and often.  Cough or sneeze into a tissue or your sleeve or elbow. Do  not cough or sneeze into your hand or into the air.  Wear a cloth face covering or face mask. Make sure your mask covers your nose and mouth. Where to find more information  Centers for Disease Control and Prevention: StickerEmporium.tn  World Health Organization: https://thompson-craig.com/ Contact a health care provider if:  You live in or have traveled to an area where COVID-19 is a risk and you have symptoms of the infection.  You have had contact with someone who has COVID-19 and you have symptoms of the infection. Get help right away if:  You have trouble breathing.  You have pain or pressure in your chest.  You have confusion.  You have  bluish lips and fingernails.  You have difficulty waking from sleep.  You have symptoms that get worse. These symptoms may represent a serious problem that is an emergency. Do not wait to see if the symptoms will go away. Get medical help right away. Call your local emergency services (911 in the [place] Do not drive yourself to the hospital. Let the emergency medical personnel know if you think you have COVID-19. Summary  COVID-19 is a respiratory infection that is caused by a virus. It is also known as coronavirus disease or novel coronavirus. It can cause serious infections, such as pneumonia, acute respiratory distress syndrome, acute respiratory failure, or sepsis.  The virus that causes COVID-19 is contagious. This means that it can spread from person to person through droplets from breathing, speaking, singing, coughing, or sneezing.  You are more likely to develop a serious illness if you are 108 years of age or older, have a weak immune system, live in a nursing home, or have chronic disease.  There is no medicine to treat COVID-19. Your health care provider will talk with you about ways to treat your symptoms.  Take steps to protect yourself and others from infection. Wash your hands often and disinfect objects and surfaces that are frequently touched every day. Stay away from people who are sick and wear a mask if you are sick. This information is not intended to replace advice given to you by your health care provider. Make sure you discuss any questions you have with your health care provider. Document Revised: 12/26/2018 Document Reviewed: 04/03/2018 Elsevier Patient Education  2020 [employer].

## 2019-03-25 DIAGNOSIS — Z8616 Personal history of COVID-19: Secondary | ICD-10-CM | POA: Insufficient documentation

## 2019-03-25 DIAGNOSIS — Z8241 Family history of sudden cardiac death: Secondary | ICD-10-CM | POA: Insufficient documentation

## 2019-03-25 DIAGNOSIS — R0602 Shortness of breath: Secondary | ICD-10-CM | POA: Insufficient documentation

## 2019-03-25 DIAGNOSIS — Z13220 Encounter for screening for lipoid disorders: Secondary | ICD-10-CM | POA: Insufficient documentation

## 2019-03-25 DIAGNOSIS — R011 Cardiac murmur, unspecified: Secondary | ICD-10-CM | POA: Insufficient documentation

## 2019-03-25 DIAGNOSIS — O10919 Unspecified pre-existing hypertension complicating pregnancy, unspecified trimester: Secondary | ICD-10-CM | POA: Insufficient documentation

## 2019-03-25 DIAGNOSIS — I10 Essential (primary) hypertension: Secondary | ICD-10-CM | POA: Insufficient documentation

## 2019-03-31 ENCOUNTER — Telehealth: Payer: Self-pay

## 2019-03-31 NOTE — Telephone Encounter (Signed)
Copied from CRM [id number]. Topic: Referral - Request for Referral >> Mar 31, 2019  9:56 AM Dalphine Handing A wrote: Has patient seen PCP for this complaint? Yes *If NO, is insurance requiring patient see PCP for this issue before PCP can refer them? Referral for which specialty: Cardiology Preferred provider/office:[phone] [facility]

## 2019-04-02 NOTE — Telephone Encounter (Signed)
Yes she was referred to cardiology at office visit when I seen her on 03/24/2019 - looks like its closed ?   CBC with Differential/Platelet 005009 Comprehensive Metabolic Panel (CMET) Lipid Panel w/o Chol/HDL Ratio TSH DG Chest 2 View Ambulatory referral to Cardiology Closed

## 2019-04-02 NOTE — Telephone Encounter (Signed)
I will put in new order because this message says shes trying to go to [facility] cardiology. According to referral note patient was suppose to have appt on 1/13 with Dr. Lady Gary

## 2019-04-02 NOTE — Telephone Encounter (Signed)
Okay to refer, please review. KW

## 2019-04-07 ENCOUNTER — Encounter: Payer: Self-pay | Admitting: Adult Health

## 2019-04-07 ENCOUNTER — Other Ambulatory Visit: Payer: Self-pay

## 2019-04-07 ENCOUNTER — Ambulatory Visit (INDEPENDENT_AMBULATORY_CARE_PROVIDER_SITE_OTHER): Payer: BLUE CROSS/BLUE SHIELD | Admitting: Adult Health

## 2019-04-07 VITALS — BP 132/88 | HR 84 | Temp 98.2°F | Resp 16 | Wt 205.6 lb

## 2019-04-07 DIAGNOSIS — R1012 Left upper quadrant pain: Secondary | ICD-10-CM | POA: Diagnosis not present

## 2019-04-07 DIAGNOSIS — I10 Essential (primary) hypertension: Secondary | ICD-10-CM

## 2019-04-07 DIAGNOSIS — E6609 Other obesity due to excess calories: Secondary | ICD-10-CM

## 2019-04-07 DIAGNOSIS — R011 Cardiac murmur, unspecified: Secondary | ICD-10-CM

## 2019-04-07 DIAGNOSIS — Z8742 Personal history of other diseases of the female genital tract: Secondary | ICD-10-CM

## 2019-04-07 DIAGNOSIS — Z6835 Body mass index (BMI) 35.0-35.9, adult: Secondary | ICD-10-CM

## 2019-04-07 DIAGNOSIS — Z8241 Family history of sudden cardiac death: Secondary | ICD-10-CM

## 2019-04-07 DIAGNOSIS — K219 Gastro-esophageal reflux disease without esophagitis: Secondary | ICD-10-CM

## 2019-04-07 MED ORDER — OMEPRAZOLE 20 MG PO CPDR: 20.0000 mg | DELAYED_RELEASE_CAPSULE | Freq: Every day | ORAL | 0 refills | Status: DC

## 2019-04-07 NOTE — Patient Instructions (Signed)
Schedule a  Abdominal Pain, Adult Many things can cause belly (abdominal) pain. Most times, belly pain is not dangerous. Many cases of belly pain can be watched and treated at home. Sometimes, though, belly pain is serious. Your doctor will try to find the cause of your belly pain. Follow these instructions at home:  Medicines  Take over-the-counter and prescription medicines only as told by your doctor.  Do not take medicines that help you poop (laxatives) unless told by your doctor. General instructions  Watch your belly pain for any changes.  Drink enough fluid to keep your pee (urine) pale yellow.  Keep all follow-up visits as told by your doctor. This is important. Contact a doctor if:  Your belly pain changes or gets worse.  You are not hungry, or you lose weight without trying.  You are having trouble pooping (constipated) or have watery poop (diarrhea) for more than 2-3 days.  You have pain when you pee or poop.  Your belly pain wakes you up at night.  Your pain gets worse with meals, after eating, or with certain foods.  You are vomiting and cannot keep anything down.  You have a fever.  You have blood in your pee. Get help right away if:  Your pain does not go away as soon as your doctor says it should.  You cannot stop vomiting.  Your pain is only in areas of your belly, such as the right side or the left lower part of the belly.  You have bloody or black poop, or poop that looks like tar.  You have very bad pain, cramping, or bloating in your belly.  You have signs of not having enough fluid or water in your body (dehydration), such as: ? Dark pee, very little pee, or no pee. ? Cracked lips. ? Dry mouth. ? Sunken eyes. ? Sleepiness. ? Weakness.  You have trouble breathing or chest pain. Summary  Many cases of belly pain can be watched and treated at home.  Watch your belly pain for any changes.  Take over-the-counter and prescription  medicines only as told by your doctor.  Contact a doctor if your belly pain changes or gets worse.  Get help right away if you have very bad pain, cramping, or bloating in your belly. This information is not intended to replace advice given to you by your health care provider. Make sure you discuss any questions you have with your health care provider. Document Revised: 07/07/2018 Document Reviewed: 07/07/2018 Elsevier Patient Education  2020 [employer] Pap Test Why am I having this test? A Pap test, also called a Pap smear, is a screening test to check for signs of:  Cancer of the vagina, cervix, and uterus. The cervix is the lower part of the uterus that opens into the vagina.  Infection.  Changes that may be a sign that cancer is developing (precancerous changes). Women need this test on a regular basis. In general, you should have a Pap test every 3 years until you reach menopause or age 31. Women aged 30-60 may choose to have their Pap test done at the same time as an HPV (human papillomavirus) test every 5 years (instead of every 3 years). Your health care provider may recommend having Pap tests more or less often depending on your medical conditions and past Pap test results. What kind of sample is taken?  Your health care provider will collect a sample of cells from the surface of your cervix. This  will be done using a small cotton swab, plastic spatula, or brush. This sample is often collected during a pelvic exam, when you are lying on your back on an exam table with feet in footrests (stirrups). In some cases, fluids (secretions) from the cervix or vagina may also be collected. How do I prepare for this test?  Be aware of where you are in your menstrual cycle. If you are menstruating on the day of the test, you may be asked to reschedule.  You may need to reschedule if you have a known vaginal infection on the day of the test.  Follow instructions from your health care  provider about: ? Changing or stopping your regular medicines. Some medicines can cause abnormal test results, such as digitalis and tetracycline. ? Avoiding douching or taking a bath the day before or the day of the test. Tell a health care provider about:  Any allergies you have.  All medicines you are taking, including vitamins, herbs, eye drops, creams, and over-the-counter medicines.  Any blood disorders you have.  Any surgeries you have had.  Any medical conditions you have.  Whether you are pregnant or may be pregnant. How are the results reported? Your test results will be reported as either abnormal or normal. A false-positive result can occur. A false positive is incorrect because it means that a condition is present when it is not. A false-negative result can occur. A false negative is incorrect because it means that a condition is not present when it is. What do the results mean? A normal test result means that you do not have signs of cancer of the vagina, cervix, or uterus. An abnormal result may mean that you have:  Cancer. A Pap test by itself is not enough to diagnose cancer. You will have more tests done in this case.  Precancerous changes in your vagina, cervix, or uterus.  Inflammation of the cervix.  An STD (sexually transmitted disease).  A fungal infection.  A parasite infection. Talk with your health care provider about what your results mean. Questions to ask your health care provider Ask your health care provider, or the department that is doing the test:  When will my results be ready?  How will I get my results?  What are my treatment options?  What other tests do I need?  What are my next steps? Summary  In general, women should have a Pap test every 3 years until they reach menopause or age 28.  Your health care provider will collect a sample of cells from the surface of your cervix. This will be done using a small cotton swab, plastic  spatula, or brush.  In some cases, fluids (secretions) from the cervix or vagina may also be collected. This information is not intended to replace advice given to you by your health care provider. Make sure you discuss any questions you have with your health care provider. Document Revised: 11/05/2016 Document Reviewed: 11/05/2016 Elsevier Patient Education  2020 [employer] Pantoprazole tablets What is this medicine? PANTOPRAZOLE (pan TOE pra zole) prevents the production of acid in the stomach. It is used to treat gastroesophageal reflux disease (GERD), inflammation of the esophagus, and Zollinger-Ellison syndrome. This medicine may be used for other purposes; ask your health care provider or pharmacist if you have questions. COMMON BRAND NAME(S): Protonix What should I tell my health care provider before I take this medicine? They need to know if you have any of these conditions:  liver  disease  low levels of magnesium in the blood  lupus  an unusual or allergic reaction to omeprazole, lansoprazole, pantoprazole, rabeprazole, other medicines, foods, dyes, or preservatives  pregnant or trying to get pregnant  breast-feeding How should I use this medicine? Take this medicine by mouth. Swallow the tablets whole with a drink of water. Follow the directions on the prescription label. Do not crush, break, or chew. Take your medicine at regular intervals. Do not take your medicine more often than directed. Talk to your pediatrician regarding the use of this medicine in children. While this drug may be prescribed for children as young as 5 years for selected conditions, precautions do apply. Overdosage: If you think you have taken too much of this medicine contact a poison control center or emergency room at once. NOTE: This medicine is only for you. Do not share this medicine with others. What if I miss a dose? If you miss a dose, take it as soon as you can. If it is almost time for  your next dose, take only that dose. Do not take double or extra doses. What may interact with this medicine? Do not take this medicine with any of the following medications:  atazanavir  nelfinavir This medicine may also interact with the following medications:  ampicillin  delavirdine  erlotinib  iron salts  medicines for fungal infections like ketoconazole, itraconazole and voriconazole  methotrexate  mycophenolate mofetil  warfarin This list may not describe all possible interactions. Give your health care provider a list of all the medicines, herbs, non-prescription drugs, or dietary supplements you use. Also tell them if you smoke, drink alcohol, or use illegal drugs. Some items may interact with your medicine. What should I watch for while using this medicine? It can take several days before your stomach pain gets better. Check with your doctor or health care provider if your condition does not start to get better, or if it gets worse. This medicine may cause serious skin reactions. They can happen weeks to months after starting the medicine. Contact your health care provider right away if you notice fevers or flu-like symptoms with a rash. The rash may be red or purple and then turn into blisters or peeling of the skin. Or, you might notice a red rash with swelling of the face, lips or lymph nodes in your neck or under your arms. You may need blood work done while you are taking this medicine. This medicine may cause a decrease in vitamin B12. You should make sure that you get enough vitamin B12 while you are taking this medicine. Discuss the foods you eat and the vitamins you take with your health care provider. What side effects may I notice from receiving this medicine? Side effects that you should report to your doctor or health care professional as soon as possible:   allergic reactions like skin rash, itching or hives, swelling of the face, lips, or tongue   bone,  muscle or joint pain   breathing problems   chest pain or chest tightness   dark yellow or brown urine   dizziness   fast, irregular heartbeat   feeling faint or lightheaded   fever or sore throat   muscle spasm   palpitations   rash on cheeks or arms that gets worse in the sun   redness, blistering, peeling or loosening of the skin, including inside the mouth   seizures  stomach polyps   tremors   unusual bleeding or bruising  unusually weak or tired   yellowing of the eyes or skin Side effects that usually do not require medical attention (report to your doctor or health care professional if they continue or are bothersome):   constipation   diarrhea   dry mouth   headache   nausea This list may not describe all possible side effects. Call your doctor for medical advice about side effects. You may report side effects to FDA at 1-800-FDA-1088. Where should I keep my medicine? Keep out of the reach of children. Store at room temperature between 15 and 30 degrees C (59 and 86 degrees F). Protect from light and moisture. Throw away any unused medicine after the expiration date. NOTE: This sheet is a summary. It may not cover all possible information. If you have questions about this medicine, talk to your doctor, pharmacist, or health care provider.  2020 Elsevier/Gold Standard (2018-06-06 13:18:32) Food Choices for Gastroesophageal Reflux Disease, Adult When you have gastroesophageal reflux disease (GERD), the foods you eat and your eating habits are very important. Choosing the right foods can help ease your discomfort. Think about working with a nutrition specialist (dietitian) to help you make good choices. What are tips for following this plan?  Meals  Choose healthy foods that are low in fat, such as fruits, vegetables, whole grains, low-fat dairy products, and lean meat, fish, and poultry.  Eat small meals often instead of 3  large meals a day. Eat your meals slowly, and in a place where you are relaxed. Avoid bending over or lying down until 2-3 hours after eating.  Avoid eating meals 2-3 hours before bed.  Avoid drinking a lot of liquid with meals.  Cook foods using methods other than frying. Bake, grill, or broil food instead.  Avoid or limit: ? Chocolate. ? Peppermint or spearmint. ? Alcohol. ? Pepper. ? Black and decaffeinated coffee. ? Black and decaffeinated tea. ? Bubbly (carbonated) soft drinks. ? Caffeinated energy drinks and soft drinks.  Limit high-fat foods such as: ? Fatty meat or fried foods. ? Whole milk, cream, butter, or ice cream. ? Nuts and nut butters. ? Pastries, donuts, and sweets made with butter or shortening.  Avoid foods that cause symptoms. These foods may be different for everyone. Common foods that cause symptoms include: ? Tomatoes. ? Oranges, lemons, and limes. ? Peppers. ? Spicy food. ? Onions and garlic. ? Vinegar. Lifestyle  Maintain a healthy weight. Ask your doctor what weight is healthy for you. If you need to lose weight, work with your doctor to do so safely.  Exercise for at least 30 minutes for 5 or more days each week, or as told by your doctor.  Wear loose-fitting clothes.  Do not smoke. If you need help quitting, ask your doctor.  Sleep with the head of your bed higher than your feet. Use a wedge under the mattress or blocks under the bed frame to raise the head of the bed. Summary  When you have gastroesophageal reflux disease (GERD), food and lifestyle choices are very important in easing your symptoms.  Eat small meals often instead of 3 large meals a day. Eat your meals slowly, and in a place where you are relaxed.  Limit high-fat foods such as fatty meat or fried foods.  Avoid bending over or lying down until 2-3 hours after eating.  Avoid peppermint and spearmint, caffeine, alcohol, and chocolate. This information is not intended to  replace advice given to you by your health care provider.  Make sure you discuss any questions you have with your health care provider. Document Revised: 06/19/2018 Document Reviewed: 04/03/2016 Elsevier Patient Education  [place].

## 2019-04-07 NOTE — Progress Notes (Signed)
Patient: Monique Forbes Female    DOB: [birth year]   29 y.o.   MRN: [id number] Visit Date: 04/07/2019  Today's Provider: Marcille Buffy, FNP   Chief Complaint  Patient presents with  . Hypertension   Subjective:     HPI  Hypertension, follow-up:  BP Readings from Last 3 Encounters:  04/07/19 132/88  03/24/19 (!) 158/94  02/26/19 (!) 150/84    She was last seen for hypertension 2 weeks ago.  BP at that visit was 158/94. Management changes since that visit include increasing Amlodipine from 2.5mg  to 5mg  qd .  Reports B/P 127/75 at home.   She reports excellent compliance with treatment. She is not having side effects.  She is exercising. She is adherent to low salt diet.   Outside blood pressures are on average . She is experiencing none.  Patient denies chest pain, chest pressure/discomfort, claudication, dyspnea, exertional chest pressure/discomfort, fatigue, irregular heart beat, lower extremity edema, near-syncope, orthopnea, palpitations, paroxysmal nocturnal dyspnea, syncope and tachypnea.   Cardiovascular risk factors include hypertension.  Use of agents associated with hypertension: NSAIDS.    Left upper quadrant swelling of skin and pain with fatty foods and greasy foods she notices it is worse with meals.She denies any current pain today.  She denies any right sided pain. She does have significant reflux for years she takes Tums for with relief. She has not been on a PPI. She would like to try one. She also denies any constipation, diarrhea, or blood in stool also denies any  dark tarry stools. Denies any trauma or abdominal surgeries in past.   She denies any chest pain, or shortness of breath at today's visit. She has made huge strides in her diet since last check as she cut out sodas and is doing healthier meals with less carbs and sugar.  She was also previously on Metformin 500mg  BID for PCOS.  LMP 03/29/2019.  Weight trend: decreasing  steadily Wt Readings from Last 3 Encounters:  04/07/19 205 lb 9.6 oz (93.3 kg)  03/24/19 210 lb 12.8 oz (95.6 kg)  02/26/19 206 lb (93.4 kg)   She decided to be seen again see [facility] and not go to Dr. Candis Musa at this time. A referral  was placed at last visit.  Current diet: in general, a "healthy" diet     Patient  denies any fever, body aches,chills, rash, chest pain, shortness of breath, nausea, vomiting, or diarrhea.    ------------------------------------------------------------------------  No Known Allergies   Current Outpatient Medications:  .  amLODipine (NORVASC) 5 MG tablet, Take 1 tablet (5 mg total) by mouth daily., Disp: 45 tablet, Rfl: 0 .  aspirin EC 81 MG tablet, Take 1 tablet (81 mg total) by mouth daily., Disp: 90 tablet, Rfl: 0 .  LORazepam (ATIVAN) 1 MG tablet, Take 1 tablet (1 mg total) by mouth 2 (two) times daily. (Patient not taking: Reported on 03/24/2019), Disp: 12 tablet, Rfl: 0  Review of Systems  Constitutional: Positive for fatigue. Negative for activity change, appetite change, chills, diaphoresis, fever and unexpected weight change.  HENT: Negative.   Eyes: Negative.   Respiratory: Negative.   Cardiovascular: Negative.   Gastrointestinal: Positive for abdominal distention (left upper quadrant ). Negative for abdominal pain, anal bleeding, blood in stool, constipation, diarrhea, nausea, rectal pain and vomiting.  Genitourinary: Negative.   Musculoskeletal: Negative.   Neurological: Negative.   Psychiatric/Behavioral: Negative.     Social History   Tobacco Use  .  Smoking status: Never Smoker  . Smokeless tobacco: Never Used  Substance Use Topics  . Alcohol use: Never       Objective:   BP 132/88   Pulse 84   Temp 98.2 F (36.8 C) (Oral)   Resp 16   Wt 205 lb 9.6 oz (93.3 kg)   SpO2 98%   BMI 34.21 kg/m  Vitals:   04/07/19 1550  BP: 132/88  Pulse: 84  Resp: 16  Temp: 98.2 F (36.8 C)  TempSrc: Oral  SpO2: 98%    Weight: 205 lb 9.6 oz (93.3 kg)  Body mass index is 34.21 kg/m.   Physical Exam Vitals reviewed.  Constitutional:      General: She is not in acute distress.    Appearance: Normal appearance. She is well-developed. She is obese. She is not ill-appearing, toxic-appearing or diaphoretic.     Interventions: She is not intubated. HENT:     Head: Normocephalic and atraumatic.     Right Ear: Tympanic membrane, ear canal and external ear normal. There is no impacted cerumen.     Left Ear: Tympanic membrane, ear canal and external ear normal. There is no impacted cerumen.     Nose: Nose normal. No congestion or rhinorrhea.     Mouth/Throat:     Mouth: Mucous membranes are moist.     Pharynx: No oropharyngeal exudate or posterior oropharyngeal erythema.  Eyes:     General: Lids are normal. No scleral icterus.       Right eye: No discharge.        Left eye: No discharge.     Extraocular Movements: Extraocular movements intact.     Conjunctiva/sclera: Conjunctivae normal.     Right eye: Right conjunctiva is not injected. No exudate or hemorrhage.    Left eye: Left conjunctiva is not injected. No exudate or hemorrhage.    Pupils: Pupils are equal, round, and reactive to light.  Neck:     Thyroid: No thyroid mass or thyromegaly.     Vascular: Normal carotid pulses. No carotid bruit, hepatojugular reflux or JVD.     Trachea: Trachea and phonation normal. No tracheal tenderness or tracheal deviation.     Meningeal: Brudzinski's sign and Kernig's sign absent.  Cardiovascular:     Rate and Rhythm: Normal rate and regular rhythm.  No extrasystoles are present.    Chest Wall: PMI is not displaced. No thrill.     Pulses: Normal pulses. No decreased pulses.          Carotid pulses are 2+ on the right side and 2+ on the left side.      Radial pulses are 2+ on the right side and 2+ on the left side.       Dorsalis pedis pulses are 2+ on the right side and 2+ on the left side.       Posterior  tibial pulses are 2+ on the right side and 2+ on the left side.     Heart sounds: S1 normal and S2 normal. Heart sounds not distant. Murmur present. Systolic murmur present with a grade of 2/6. No diastolic murmur. No friction rub. No gallop. No S3 or S4 sounds.   Pulmonary:     Effort: Pulmonary effort is normal. No tachypnea, bradypnea, accessory muscle usage, prolonged expiration, respiratory distress or retractions. She is not intubated.     Breath sounds: Normal breath sounds and air entry. No stridor, decreased air movement or transmitted upper airway sounds. No decreased breath  sounds, wheezing, rhonchi or rales.  Chest:     Chest wall: No tenderness.  Abdominal:     General: Bowel sounds are normal. There is distension (scant left upper quadrant ). There is no abdominal bruit. There are no signs of injury.     Palpations: Abdomen is soft. There is no shifting dullness, fluid wave, hepatomegaly, splenomegaly, mass or pulsatile mass.     Tenderness: There is abdominal tenderness (mild in left middle to upper quadrant ) in the left upper quadrant. There is no right CVA tenderness, left CVA tenderness, guarding or rebound. Negative signs include Murphy's sign, McBurney's sign, psoas sign and obturator sign.     Hernia: No hernia is present. There is no hernia in the umbilical area or ventral area.       Comments: LUQ area of concern marked.  No renal bruit bilaterally.   Genitourinary:    Comments: Declined by patient today, denies any concerns. She has  [place] medicaid family planning and is considering seeing gynecology for PAP/ PCOS. She declined referral today. She will follow up with me if she desires recommend PAP/ exam. Declined 04/07/2019 she will follow up.national guideline recommendation reviewed.  Musculoskeletal:        General: No tenderness or deformity. Normal range of motion.     Cervical back: Full passive range of motion without pain, normal range of motion and neck supple. No  edema, erythema, rigidity or tenderness. No spinous process tenderness or muscular tenderness. Normal range of motion.     Right lower leg: No edema.     Left lower leg: No edema.  Lymphadenopathy:     Head:     Right side of head: No submental, submandibular, tonsillar, preauricular, posterior auricular or occipital adenopathy.     Left side of head: No submental, submandibular, tonsillar, preauricular, posterior auricular or occipital adenopathy.     Cervical: No cervical adenopathy.     Right cervical: No superficial, deep or posterior cervical adenopathy.    Left cervical: No superficial, deep or posterior cervical adenopathy.     Upper Body:     Right upper body: No supraclavicular or pectoral adenopathy.     Left upper body: No supraclavicular or pectoral adenopathy.  Skin:    General: Skin is warm and dry.     Capillary Refill: Capillary refill takes less than 2 seconds.     Coloration: Skin is not pale.     Findings: No abrasion, bruising, burn, ecchymosis, erythema, lesion, petechiae or rash.     Nails: There is no clubbing.  Neurological:     General: No focal deficit present.     Mental Status: She is alert and oriented to person, place, and time. Mental status is at baseline.     GCS: GCS eye subscore is 4. GCS verbal subscore is 5. GCS motor subscore is 6.     Cranial Nerves: No cranial nerve deficit.     Sensory: No sensory deficit.     Motor: No weakness, tremor, atrophy, abnormal muscle tone or seizure activity.     Coordination: Coordination normal.     Gait: Gait normal.     Deep Tendon Reflexes: Reflexes are normal and symmetric. Reflexes normal. Babinski sign absent on the right side. Babinski sign absent on the left side.     Reflex Scores:      Tricep reflexes are 2+ on the right side and 2+ on the left side.      Bicep reflexes are  2+ on the right side and 2+ on the left side.      Brachioradialis reflexes are 2+ on the right side and 2+ on the left side.       Patellar reflexes are 2+ on the right side and 2+ on the left side.      Achilles reflexes are 2+ on the right side and 2+ on the left side. Psychiatric:        Mood and Affect: Mood normal.        Speech: Speech normal.        Behavior: Behavior normal.        Thought Content: Thought content normal.        Judgment: Judgment normal.      No results found for any visits on 04/07/19.     Assessment & Plan    1. Left upper quadrant abdominal pain She will keep a log of when she notices the pain, will check labs.  Will order ultrasound.  She denies any chest pain or shortness of breath at today's visit.  Discussed RED flags and when to seek care immediately.  Differentials include biliary colic, pancreatitis, ulcer, and less likely cardiac origin discussed given reported symptoms.  - Lipase - Amylase - US Abdomen Complete; Future  2. History of PCOS Patient also needs a gynecology Pap smear.  She does not have coverage to this current insurance plan to have that done at this office, she will let me know if she wants to go at [facility] care or if she wants to come here to have it done.  She has Medicaid planning. - US Abdomen Complete; Future  3. Hypertension, unspecified type She reports lower readings of her blood pressure at home 125/76, will continue amlodipine 5 mg by mouth daily.  Amlodipine 5 mg daily.  Amlodipine 5 mg dailyAmlodipine 5mg  po qd. She did not go to cardiology as referred at last visit, she desires to now return back to [facility] in [facility] and asks a referral be placed. She reports she spoke to them and she is able to go next week if referral is placed. She has been followed there in past and had testing. We have requested records. Urgent referral was placed again today 04/07/2019.  - Ambulatory referral to Cardiology  4. 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   5. Heart murmur Present for  years per patient.  - Ambulatory referral to Cardiology  6. Family history of sudden cardiac death in mother Continue follow up with cardiology.   - Ambulatory referral to Cardiology She also did not give her her lab work as ordered at last visit, she will go for that lab work fasting as soon as possible.  She will go to the emergency room should any cardiac or abdominal symptoms occur.  Strict go to the emergency room immediately were given to patient should any symptoms change or worsen.  7.  GERD without esophagitis  Diet information.  Meds ordered this encounter  Medications  . omeprazole (PRILOSEC) 20 MG capsule    Sig: Take 1 capsule (20 mg total) by mouth daily.    Dispense:  90 capsule    Refill:  0   Advised patient call the office or your primary care doctor for an appointment if no improvement within  72 hours or if any symptoms change or worsen at any time  Advised ER or urgent Care if after hours or on weekend. Call 911 for emergency symptoms at any time.Patinet verbalized understanding of all instructions given/reviewed and treatment plan and has no further questions or concerns at this time.     The entirety of the information documented in the History of Present Illness, Review of Systems and Physical Exam were personally obtained by me. Portions of this information were initially documented by the  Certified Medical Assistant whose name is documented in Epic and reviewed by me for thoroughness and accuracy.  I have personally performed the exam and reviewed the chart and it is accurate to the best of my knowledge.  Museum/gallery conservator has been used and any errors in dictation or transcription are unintentional.  Eula Fried. Flinchum FNP-C  [facility]  Jairo Ben, FNP  [facility]

## 2019-04-08 ENCOUNTER — Other Ambulatory Visit: Payer: Self-pay | Admitting: Adult Health

## 2019-04-09 ENCOUNTER — Other Ambulatory Visit: Payer: Self-pay | Admitting: Adult Health

## 2019-04-09 LAB — CBC WITH DIFFERENTIAL/PLATELET
Basophils Absolute: 0.1 10*3/uL (ref 0.0–0.2)
Basos: 1 %
EOS (ABSOLUTE): 0.4 10*3/uL (ref 0.0–0.4)
Eos: 5 %
Hematocrit: 38.6 % (ref 34.0–46.6)
Hemoglobin: 13.1 g/dL (ref 11.1–15.9)
Immature Grans (Abs): 0 10*3/uL (ref 0.0–0.1)
Immature Granulocytes: 0 %
Lymphocytes Absolute: 3 10*3/uL (ref 0.7–3.1)
Lymphs: 43 %
MCH: 28.6 pg (ref 26.6–33.0)
MCHC: 33.9 g/dL (ref 31.5–35.7)
MCV: 84 fL (ref 79–97)
Monocytes Absolute: 0.4 10*3/uL (ref 0.1–0.9)
Monocytes: 5 %
Neutrophils Absolute: 3.2 10*3/uL (ref 1.4–7.0)
Neutrophils: 46 %
Platelets: 371 10*3/uL (ref 150–450)
RBC: 4.58 x10E6/uL (ref 3.77–5.28)
RDW: 12.5 % (ref 11.7–15.4)
WBC: 7 10*3/uL (ref 3.4–10.8)

## 2019-04-09 LAB — COMPREHENSIVE METABOLIC PANEL
ALT: 36 IU/L — ABNORMAL HIGH (ref 0–32)
AST: 23 IU/L (ref 0–40)
Albumin/Globulin Ratio: 1.7 (ref 1.2–2.2)
Albumin: 4.5 g/dL (ref 3.9–5.0)
Alkaline Phosphatase: 41 IU/L (ref 39–117)
BUN/Creatinine Ratio: 17 (ref 9–23)
BUN: 10 mg/dL (ref 6–20)
Bilirubin Total: 0.4 mg/dL (ref 0.0–1.2)
CO2: 23 mmol/L (ref 20–29)
Calcium: 9.5 mg/dL (ref 8.7–10.2)
Chloride: 102 mmol/L (ref 96–106)
Creatinine, Ser: 0.6 mg/dL (ref 0.57–1.00)
GFR calc Af Amer: 141 mL/min/{1.73_m2} (ref >59)
GFR calc non Af Amer: 123 mL/min/{1.73_m2} (ref >59)
Globulin, Total: 2.7 g/dL (ref 1.5–4.5)
Glucose: 92 mg/dL (ref 65–99)
Potassium: 4.2 mmol/L (ref 3.5–5.2)
Sodium: 139 mmol/L (ref 134–144)
Total Protein: 7.2 g/dL (ref 6.0–8.5)

## 2019-04-09 LAB — LIPID PANEL W/O CHOL/HDL RATIO
Cholesterol, Total: 133 mg/dL (ref 100–199)
HDL: 40 mg/dL (ref >39)
LDL Chol Calc (NIH): 50 mg/dL (ref 0–99)
Triglycerides: 277 mg/dL — ABNORMAL HIGH (ref 0–149)
VLDL Cholesterol Cal: 43 mg/dL — ABNORMAL HIGH (ref 5–40)

## 2019-04-09 LAB — TSH: TSH: 2.44 u[IU]/mL (ref 0.450–4.500)

## 2019-04-09 LAB — AMYLASE: Amylase: 63 U/L (ref 31–110)

## 2019-04-09 LAB — LIPASE: Lipase: 43 U/L (ref 14–72)

## 2019-04-09 MED ORDER — METFORMIN HCL 500 MG PO TABS: 500.0000 mg | ORAL_TABLET | Freq: Two times a day (BID) | ORAL | 0 refills | Status: DC

## 2019-04-09 NOTE — Progress Notes (Signed)
Patient has been on Metformin reported since 2018 for PCOS.  Refilled after labs were completed.   Meds ordered this encounter  Medications  . metFORMIN (GLUCOPHAGE) 500 MG tablet    Sig: Take 1 tablet (500 mg total) by mouth 2 (two) times daily with a meal.    Dispense:  180 tablet    Refill:  0

## 2019-04-09 NOTE — Progress Notes (Signed)
  CBC normal no anemia, no signs of infection. CMP electrolytes normal, glucose is normal at 92 much improved with her diet changes. Kidney function is normal. ALT is very mildly elevated recommend avoid tylenol and alcohol.  Triglycerides are elevated, add on an over the counter omega 3 fish oil without mercury per package, decrease saturated fats, avoid carbohydrates- white breads and starches, as well as wine, follow a low triglyceride diet. Total cholesterol and LDL bad cholesterol is normal. Also would like her to discuss her triglycerides with her [facility] cardiologist given her family history.  Thyroid is normal lab.  Amylase and lipase are normal.  Abdominal Ultrasound performed for her left upper quadrant pain resulted with  pancreas was not well visualized due to body habitus and gas and commented likely fatty liver. If she continues to have the mild swelling and occasional pain in that area  and the ultrasound was inconclusive - I would recommend a CT scan and can order that if she would like to proceed.  Also wanted to verify how long she has been off the Metformin for her history of PCOS?

## 2019-05-01 ENCOUNTER — Ambulatory Visit: Admit: 2019-05-01 | Payer: PRIVATE HEALTH INSURANCE | Attending: Cardiovascular Disease | Primary: Internal Medicine

## 2019-05-01 ENCOUNTER — Encounter: Admit: 2019-05-01 | Payer: PRIVATE HEALTH INSURANCE | Attending: Cardiovascular Disease | Primary: Internal Medicine

## 2019-05-01 DIAGNOSIS — F32A Depression: Secondary | ICD-10-CM

## 2019-05-01 DIAGNOSIS — I10 Essential (primary) hypertension: Secondary | ICD-10-CM

## 2019-05-01 DIAGNOSIS — T7422XA Child sexual abuse, confirmed, initial encounter: Secondary | ICD-10-CM

## 2019-05-01 DIAGNOSIS — F603 Borderline personality disorder: Secondary | ICD-10-CM

## 2019-05-01 DIAGNOSIS — T1491XA Suicide attempt, initial encounter: Secondary | ICD-10-CM

## 2019-05-01 DIAGNOSIS — F4310 Post-traumatic stress disorder, unspecified: Secondary | ICD-10-CM

## 2019-05-01 DIAGNOSIS — E785 Hyperlipidemia, unspecified: Secondary | ICD-10-CM

## 2019-05-01 DIAGNOSIS — J45909 Unspecified asthma, uncomplicated: Secondary | ICD-10-CM

## 2019-05-01 DIAGNOSIS — R011 Cardiac murmur, unspecified: Secondary | ICD-10-CM

## 2019-05-01 DIAGNOSIS — I499 Cardiac arrhythmia, unspecified: Secondary | ICD-10-CM

## 2019-05-01 DIAGNOSIS — E8881 Metabolic syndrome: Secondary | ICD-10-CM

## 2019-05-01 DIAGNOSIS — E282 Polycystic ovarian syndrome: Secondary | ICD-10-CM

## 2019-05-01 DIAGNOSIS — F419 Anxiety disorder, unspecified: Secondary | ICD-10-CM

## 2019-05-01 DIAGNOSIS — Z8249 Family history of ischemic heart disease and other diseases of the circulatory system: Secondary | ICD-10-CM

## 2019-05-01 DIAGNOSIS — T7411XA Adult physical abuse, confirmed, initial encounter: Secondary | ICD-10-CM

## 2019-05-01 MED ORDER — METFORMIN ER 1,000 MG 24 HR TABLET,EXTENDED RELEASE (GASTRIC RETEN.)
1000 mg | ORAL | Status: AC
Start: 2019-05-01 — End: ?

## 2019-05-01 MED ORDER — AMLODIPINE 5 MG TABLET
5 mg | ORAL_TABLET | Freq: Every day | ORAL | 12 refills | Status: AC
Start: 2019-05-01 — End: ?

## 2019-05-01 MED ORDER — OMEGA-3 ACID ETHYL ESTERS 1 GRAM CAPSULE
1 gram | ORAL_CAPSULE | Freq: Two times a day (BID) | ORAL | 6 refills | Status: AC
Start: 2019-05-01 — End: ?

## 2019-05-02 DIAGNOSIS — E8881 Metabolic syndrome: Secondary | ICD-10-CM

## 2019-05-02 DIAGNOSIS — Z8249 Family history of ischemic heart disease and other diseases of the circulatory system: Secondary | ICD-10-CM
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
## 2019-05-05 ENCOUNTER — Encounter: Admit: 2019-05-05 | Payer: PRIVATE HEALTH INSURANCE | Attending: Obstetrics and Gynecology | Primary: Internal Medicine

## 2019-05-05 ENCOUNTER — Ambulatory Visit: Admit: 2019-05-05 | Payer: MEDICAID | Attending: Obstetrics and Gynecology | Primary: Internal Medicine

## 2019-05-05 DIAGNOSIS — T7422XA Child sexual abuse, confirmed, initial encounter: Secondary | ICD-10-CM

## 2019-05-05 DIAGNOSIS — R011 Cardiac murmur, unspecified: Secondary | ICD-10-CM

## 2019-05-05 DIAGNOSIS — F4310 Post-traumatic stress disorder, unspecified: Secondary | ICD-10-CM

## 2019-05-05 DIAGNOSIS — F419 Anxiety disorder, unspecified: Secondary | ICD-10-CM

## 2019-05-05 DIAGNOSIS — T7411XA Adult physical abuse, confirmed, initial encounter: Secondary | ICD-10-CM

## 2019-05-05 DIAGNOSIS — J45909 Unspecified asthma, uncomplicated: Secondary | ICD-10-CM

## 2019-05-05 DIAGNOSIS — F32A Depression: Secondary | ICD-10-CM

## 2019-05-05 DIAGNOSIS — Z8249 Family history of ischemic heart disease and other diseases of the circulatory system: Secondary | ICD-10-CM

## 2019-05-05 DIAGNOSIS — E785 Hyperlipidemia, unspecified: Secondary | ICD-10-CM

## 2019-05-05 DIAGNOSIS — Z3202 Encounter for pregnancy test, result negative: Secondary | ICD-10-CM

## 2019-05-05 DIAGNOSIS — I10 Essential (primary) hypertension: Secondary | ICD-10-CM

## 2019-05-05 DIAGNOSIS — F603 Borderline personality disorder: Secondary | ICD-10-CM

## 2019-05-05 DIAGNOSIS — Z01419 Encounter for gynecological examination (general) (routine) without abnormal findings: Secondary | ICD-10-CM

## 2019-05-05 DIAGNOSIS — E282 Polycystic ovarian syndrome: Secondary | ICD-10-CM

## 2019-05-05 DIAGNOSIS — T1491XA Suicide attempt, initial encounter: Secondary | ICD-10-CM
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
## 2019-05-05 NOTE — Patient Instructions
Speak with your primary care doctor about switching amlodipine to nifedipine Call me if you do not get a period within next couple weeks I will then send you for blood workBut call us if you do get your period because then I want to get an ultrasound on the 5th-7th day of your period Diet for Polycystic Ovary SyndromePolycystic ovary syndrome (PCOS) is a disorder of the chemicals (hormones) that regulate a woman's reproductive system, including monthly periods (menstruation). The condition causes important hormones to be out of balance. PCOS can:?	Stop your periods or make them irregular.?	Cause cysts to develop on your ovaries.?	Make it difficult to get pregnant.?	Stop your body from responding to the effects of insulin (insulin resistance). Insulin resistance can lead to obesity and diabetes.Changing what you eat can help you manage PCOS and improve your health. Following a balanced diet can help you lose weight and improve the way that your body uses insulin.What are tips for following this plan??	Follow a balanced diet for meals and snacks. Eat breakfast, lunch, dinner, and one or two snacks every day.?	Include protein in each meal and snack.?	Choose whole grains instead of products that are made with refined flour.?	Eat a variety of foods.?	Exercise regularly as told by your health care provider. Aim to do 30 or more minutes of exercise on most days of the week.?	If you are overweight or obese:?	Pay attention to how many calories you eat. Cutting down on calories can help you lose weight.?	Work with your health care provider or a diet and nutrition specialist (dietitian) to figure out how many calories you need each day.What foods can I eat?FruitsInclude a variety of colors and types. All fruits are helpful for PCOS.VegetablesInclude a variety of colors and types. All vegetables are helpful for PCOS.Grains Whole grains, such as whole wheat. Whole-grain breads, crackers, cereals, and pasta. Unsweetened oatmeal, bulgur, barley, quinoa, and brown rice. Tortillas made from corn or whole-wheat flour.Meats and other proteinsLow-fat (lean) proteins, such as fish, chicken, beans, eggs, and tofu.DairyLow-fat dairy products, such as skim milk, cheese sticks, and yogurt.BeveragesLow-fat or fat-free drinks, such as water, low-fat milk, sugar-free drinks, and small amounts of 100% fruit juice.Seasonings and condimentsKetchup. Mustard. Barbecue sauce. Relish. Low-fat or fat-free mayonnaise.Fats and oilsOlive oil or canola oil. Walnuts and almonds.The items listed above may not be a complete list of recommended foods and beverages. Contact a dietitian for more options.What foods are not recommended?Foods that are high in calories or fat. Fried foods. Sweets. Products that are made from refined white flour, including white bread, pastries, white rice, and pasta.The items listed above may not be a complete list of foods and beverages to avoid. Contact a dietitian for more information.Summary?	PCOS is a hormonal imbalance that affects a woman's reproductive system.?	You can help to manage your PCOS by exercising regularly and eating a healthy, varied diet of vegetables, fruit, whole grains, low-fat (lean) protein, and low-fat dairy products.?	Changing what you eat can improve the way that your body uses insulin, help your hormones reach normal levels, and help you lose weight.This information is not intended to replace advice given to you by your health care provider. Make sure you discuss any questions you have with your health care provider.Document Released: 06/20/2015 Document Revised: 06/18/2018 Document Reviewed: 10/22/2018Elsevier Patient Education ? 2020 Elsevier Inc.[place] health website - nutritionist information [facility] [address] [place], [place] [phone] Dietitian, Alma Friendly, [address], [place] [phone] [facility], [address] [phone] [place], [id number] [place], [phone] [place], [id number]  [place] [phone]

## 2019-05-06 ENCOUNTER — Telehealth: Payer: Self-pay
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
## 2019-05-07 NOTE — Telephone Encounter (Signed)
Will be ok for her to take Tylenol Extra strength 500 mg two tablets every 6 to 8 hours as needed for dental pain - short term.  Ibuprofen is ok if her dentist advised no NSAIDS due to bleeding risk she should avoid.

## 2019-05-07 NOTE — Telephone Encounter (Signed)
Patient advised.

## 2019-05-08 ENCOUNTER — Ambulatory Visit: Payer: Self-pay | Admitting: Adult Health

## 2019-06-06 DIAGNOSIS — R42 Dizziness and giddiness: Secondary | ICD-10-CM | POA: Insufficient documentation

## 2019-06-16 ENCOUNTER — Telehealth: Payer: Self-pay | Admitting: Adult Health

## 2019-06-16 MED ORDER — AMLODIPINE BESYLATE 5 MG PO TABS: 5.0000 mg | ORAL_TABLET | Freq: Every day | ORAL | 1 refills | Status: DC

## 2019-06-16 MED ORDER — METFORMIN HCL 500 MG PO TABS: 500.0000 mg | ORAL_TABLET | Freq: Two times a day (BID) | ORAL | 1 refills | Status: DC

## 2019-06-16 NOTE — Telephone Encounter (Signed)
Requested Prescriptions  Pending Prescriptions Disp Refills  . amLODipine (NORVASC) 5 MG tablet 90 tablet 1    Sig: Take 1 tablet (5 mg total) by mouth daily.     Cardiovascular:  Calcium Channel Blockers Passed - 06/16/2019  3:00 PM      Passed - Last BP in normal range    BP Readings from Last 1 Encounters:  04/07/19 132/88         Passed - Valid encounter within last 6 months    Recent Outpatient Visits          2 months ago Hypertension, unspecified type   [place], Kelby Aline, FNP   2 months ago Hypertension, unspecified type   [facility], Kelby Aline, FNP             . metFORMIN (GLUCOPHAGE) 500 MG tablet 180 tablet 1    Sig: Take 1 tablet (500 mg total) by mouth 2 (two) times daily with a meal.     Endocrinology:  Diabetes - Biguanides Failed - 06/16/2019  3:00 PM      Failed - HBA1C is between 0 and 7.9 and within 180 days    No results found for: HGBA1C, LABA1C       Passed - Cr in normal range and within 360 days    Creatinine, Ser  Date Value Ref Range Status  04/08/2019 0.60 0.57 - 1.00 mg/dL Final         Passed - eGFR in normal range and within 360 days    GFR calc Af Amer  Date Value Ref Range Status  04/08/2019 141 >59 mL/min/1.73 Final   GFR calc non Af Amer  Date Value Ref Range Status  04/08/2019 123 >59 mL/min/1.73 Final         Passed - Valid encounter within last 6 months    Recent Outpatient Visits          2 months ago Hypertension, unspecified type   [facility], Kelby Aline, FNP   2 months ago Hypertension, unspecified type   [facility], Kelby Aline, FNP

## 2019-06-16 NOTE — Telephone Encounter (Signed)
Copied from CRM [id number]. Topic: Quick Communication - Rx Refill/Question >> Jun 16, 2019  2:56 PM Mcneil, Ja-Kwan wrote: Medication: amLODipine (NORVASC) 5 MG tablet and metFORMIN (GLUCOPHAGE) 500 MG tablet  Has the patient contacted their pharmacy? no  Preferred Pharmacy (with phone number or street name): [facility] [place], [place] - [address]  Phone: [phone]   Fax: [phone]  Agent: Please be advised that RX refills may take up to 3 business days. We ask that you follow-up with your pharmacy.

## 2019-06-17 ENCOUNTER — Other Ambulatory Visit: Payer: Self-pay | Admitting: Adult Health

## 2019-06-17 MED ORDER — METFORMIN HCL ER (MOD) 1000 MG PO TB24: 1000.0000 mg | ORAL_TABLET | Freq: Every day | ORAL | 0 refills | Status: DC

## 2019-06-17 NOTE — Telephone Encounter (Signed)
Metformin 1,000 mg ER at bedtime ? Clarification from previous message   Thank you,  Marvell Fuller MSN, AGNP-C, FNP-C  Family Nurse Practitioner  Adult Geriatric Nurse Practitioner

## 2019-06-17 NOTE — Telephone Encounter (Signed)
Patient states she has been taking Metformin 1000 mg at bedtime since 2018

## 2019-06-17 NOTE — Telephone Encounter (Signed)
Patient requesting call back regarding Metformin. She states the "ER" was sent in and it is different than what she has previously been taking.

## 2019-06-17 NOTE — Telephone Encounter (Signed)
This is what I sent in to her pharmacy, please verify what this message from [facility] is regarding I did not send in ER. Monique Forbes    Patient has been on Metformin reported since 2018 for PCOS.  Refilled after labs were completed.        Meds ordered this encounter  Medications   metFORMIN (GLUCOPHAGE) 500 MG tablet      Sig: Take 1 tablet (500 mg total) by mouth 2 (two) times daily with a meal.      Dispense:  180 tablet      Refill:  0

## 2019-06-17 NOTE — Telephone Encounter (Signed)
Patient states she was taking Metformin ER that was prescribed by previous PCP. Patient states she takes 100 mg ER at bedtime.

## 2019-06-17 NOTE — Telephone Encounter (Signed)
Sent to pharmacy. Discontinued 500 mg BID. I see where she went to ER but was not  seen for dizziness and she should monitor glucose and follow up with office if she has persistent symptoms or any hypo or hyperglycemia.  Hemoglobin A1C in 3 months.  Advised patient call the office or your primary care doctor for an appointment if no improvement within 72 hours or if any symptoms change or worsen at any time  Advised ER or urgent Care if after hours or on weekend. Call 911 for emergency symptoms at any time.Patinet verbalized understanding of all instructions given/reviewed and treatment plan and has no further questions or concerns at this time.

## 2019-06-26 ENCOUNTER — Other Ambulatory Visit: Payer: Self-pay | Admitting: Adult Health

## 2019-06-26 DIAGNOSIS — D696 Thrombocytopenia, unspecified: Secondary | ICD-10-CM

## 2019-06-26 DIAGNOSIS — R791 Abnormal coagulation profile: Secondary | ICD-10-CM

## 2019-07-06 ENCOUNTER — Other Ambulatory Visit: Payer: Self-pay | Admitting: Adult Health

## 2019-07-06 MED ORDER — METFORMIN HCL ER (OSM) 500 MG PO TB24: 1000.0000 mg | ORAL_TABLET | Freq: Every day | ORAL | 0 refills | Status: DC

## 2019-07-08 ENCOUNTER — Other Ambulatory Visit: Payer: Self-pay | Admitting: Adult Health

## 2019-07-08 ENCOUNTER — Telehealth: Payer: Self-pay | Admitting: Adult Health

## 2019-07-08 MED ORDER — METFORMIN HCL ER 500 MG PO TB24: 1000.0000 mg | ORAL_TABLET | Freq: Every day | ORAL | 0 refills | Status: DC

## 2019-07-08 NOTE — Telephone Encounter (Signed)
Please call her pharmacy, I have sent in Metformin ER 500 mg with instructions to take two tablets by mouth ONCE daily) for a total of 1,000 mg  per day.  # tablets 180 for three month supply.   I have also signed two different faxes for this medication.  Let me know if questions or have pharmacy send request for what her insurance will cover as long as dose equivalent is the same.

## 2019-07-08 NOTE — Progress Notes (Signed)
Meds ordered this encounter  Medications  . metFORMIN (GLUCOPHAGE XR) 500 MG 24 hr tablet    Sig: Take 2 tablets (1,000 mg total) by mouth daily with breakfast.    Dispense:  120 tablet    Refill:  0

## 2019-07-08 NOTE — Telephone Encounter (Signed)
Pt is calling and the wrong metformin is being sent to pharm. Pt needs metformin er  500 mg . The metformin that was sent in cost 1800.00. [facility] [address] in [place] rx number [phone]

## 2019-07-08 NOTE — Telephone Encounter (Signed)
Please review. KW 

## 2019-07-08 NOTE — Telephone Encounter (Signed)
Called and spoke with patient who states that we have sent over the same prescription twice and insurance will not cover, patient states that she was told to give PCP rx number below. KW

## 2019-07-08 NOTE — Telephone Encounter (Signed)
Meds ordered this encounter  Medications   metFORMIN (GLUCOPHAGE XR) 500 MG 24 hr tablet    Sig: Take 2 tablets (1,000 mg total) by mouth daily with breakfast.    Dispense:  120 tablet    Refill:  0  Sent to pharmacy

## 2019-07-09 NOTE — Telephone Encounter (Signed)
Patient advised.KW 

## 2019-07-09 NOTE — Telephone Encounter (Signed)
Please inform patient of the following. She should only pick up the below medication. Monitor for hypoglycemia/ hyperglycemia as well. Call with questions.   07/09/2019 Provider called and left message on pharmacy machine to discontinue previous sent in medications related to Glucophage/ metformin.   Metformin ER 500 mg tablet :  Sig : take two tablets by mouth ONCE daily ( for a total of 1,000 mg  per day.)   # tablets 180 for three month supply.   Left call back number for office for any questions or concerns.  I have previously faxed / approved this above script to pharmacy twice as well in the past week.

## 2019-07-22 ENCOUNTER — Encounter: Payer: Self-pay | Admitting: Adult Health

## 2019-07-22 NOTE — Progress Notes (Signed)
Dismissal letter 07/22/2019 documentation of cursing and hollering at [facility] front office staff.

## 2019-07-28 ENCOUNTER — Telehealth: Payer: Self-pay | Admitting: Adult Health

## 2019-07-28 ENCOUNTER — Telehealth: Payer: Self-pay

## 2019-07-28 NOTE — Telephone Encounter (Signed)
Copied from CRM [id number]. Topic: Medical Record Request - Other >> Jul 28, 2019 10:54 AM Daymon Larsen wrote: Patient Name/DOB/MRN #: Monique Forbes  Requestor Name/Agency:  [facility]  Call Back #:  [phone] Information Requested: Pt is requesting medical records in order to transfer to new provider     For all other clinics, route to the clinic's PEC Pool.

## 2019-07-28 NOTE — Telephone Encounter (Signed)
Please review patient request below. KW 

## 2019-07-28 NOTE — Telephone Encounter (Signed)
Copied from CRM #[id number]. Topic: Medical Record Request - Other >> Jul 28, 2019 10:54 AM Simmons, Ashley wrote: Patient Name/DOB/MRN #: Monique Forbes  Requestor Name/Agency:  [facility]  Call Back #:  [phone] Information Requested: Pt is requesting medical records in order to transfer to new provider     For all other clinics, route to the clinic's PEC Pool. 

## 2019-08-13 ENCOUNTER — Telehealth: Admit: 2019-08-13 | Payer: PRIVATE HEALTH INSURANCE | Attending: Obstetrics and Gynecology | Primary: Internal Medicine

## 2019-08-13 NOTE — Telephone Encounter
Alicia Pitts 08-11-1990 CG pt:Her # [phone] annual 2/23/21Patient has PCOS. Gets her period every other month. She got her period 5/24- one day of heavy bleeding then it was over the next day. She said this never happens. She wanted to mention that she did have intercourse with her husband prior to getting her period. She would like to speak with you.

## 2019-08-14 NOTE — Telephone Encounter
Spoke with ptOffered her appt for [place] and visit with CG for 08/18/2019 - pt refusedSaid she would call back - [place] and [place] to figure out when she will be able to come to [place]

## 2019-08-14 NOTE — Telephone Encounter
Had period in march after seeing me , then none in April. Then 2 d after intercourse had some spotting with then heavy bleeding the following day then stopped. Did have some pms type symtpoms prior to the bleeding epsidoe but this is atypical for Alicia Pitts. She is to come in for ultrasound and follow up visit with me

## 2019-09-04 ENCOUNTER — Ambulatory Visit: Admit: 2019-09-04 | Payer: MEDICAID | Attending: Obstetrics and Gynecology | Primary: Internal Medicine

## 2019-09-04 ENCOUNTER — Encounter: Admit: 2019-09-04 | Payer: MEDICAID | Attending: Obstetrics and Gynecology | Primary: Internal Medicine

## 2019-09-04 DIAGNOSIS — Z113 Encounter for screening for infections with a predominantly sexual mode of transmission: Secondary | ICD-10-CM

## 2019-09-04 DIAGNOSIS — N939 Abnormal uterine and vaginal bleeding, unspecified: Secondary | ICD-10-CM

## 2019-09-04 DIAGNOSIS — Z3202 Encounter for pregnancy test, result negative: Secondary | ICD-10-CM

## 2019-09-04 DIAGNOSIS — E282 Polycystic ovarian syndrome: Secondary | ICD-10-CM

## 2019-09-04 MED ORDER — MEDROXYPROGESTERONE 10 MG TABLET
10 mg | ORAL_TABLET | Freq: Every day | ORAL | 1 refills | Status: AC
Start: 2019-09-04 — End: ?

## 2019-09-04 NOTE — Progress Notes
I, Precious Haws, MD, personally reviewed the ultrasound images and report performed by the sonographer and confirmed the accuracy of the information.My Impression is:Had heterogeneous uterus.  Endometrial thickness 7.7 millimeters.  Right left ovary have tiny follicles consistent with PCOS.Tonie Elsey E. Bobette Mo, MD 09/04/2019 3:33 PM
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
## 2019-09-04 NOTE — Patient Instructions
Get the ovulation predictor kit strips and start testing every dayTry to have as much intercourse as you possibly can - at least 2-3x/weekDo the provera pills x 1 week to help flush out whatever lining has built upYou will get a period usually within 1 week of finishing the provera pills On either day 2 , 3 or 4 of your next period, do the blood work I ordered When I get everything back we will then discuss next steps

## 2019-09-10 DIAGNOSIS — Z419 Encounter for procedure for purposes other than remedying health state, unspecified: Secondary | ICD-10-CM | POA: Diagnosis not present

## 2019-10-07 ENCOUNTER — Ambulatory Visit: Admit: 2019-10-07 | Payer: MEDICAID | Attending: Obstetrics and Gynecology | Primary: Internal Medicine

## 2019-10-07 ENCOUNTER — Encounter: Admit: 2019-10-07 | Payer: PRIVATE HEALTH INSURANCE | Attending: Obstetrics and Gynecology | Primary: Internal Medicine

## 2019-10-07 DIAGNOSIS — T1491XA Suicide attempt, initial encounter: Secondary | ICD-10-CM

## 2019-10-07 DIAGNOSIS — Z8249 Family history of ischemic heart disease and other diseases of the circulatory system: Secondary | ICD-10-CM

## 2019-10-07 DIAGNOSIS — E282 Polycystic ovarian syndrome: Secondary | ICD-10-CM

## 2019-10-07 DIAGNOSIS — F32A Depression: Secondary | ICD-10-CM

## 2019-10-07 DIAGNOSIS — E785 Hyperlipidemia, unspecified: Secondary | ICD-10-CM

## 2019-10-07 DIAGNOSIS — F603 Borderline personality disorder: Secondary | ICD-10-CM

## 2019-10-07 DIAGNOSIS — T7422XA Child sexual abuse, confirmed, initial encounter: Secondary | ICD-10-CM

## 2019-10-07 DIAGNOSIS — F4310 Post-traumatic stress disorder, unspecified: Secondary | ICD-10-CM

## 2019-10-07 DIAGNOSIS — F419 Anxiety disorder, unspecified: Secondary | ICD-10-CM

## 2019-10-07 DIAGNOSIS — J45909 Unspecified asthma, uncomplicated: Secondary | ICD-10-CM

## 2019-10-07 DIAGNOSIS — N760 Acute vaginitis: Secondary | ICD-10-CM

## 2019-10-07 DIAGNOSIS — T7411XA Adult physical abuse, confirmed, initial encounter: Secondary | ICD-10-CM

## 2019-10-07 DIAGNOSIS — R011 Cardiac murmur, unspecified: Secondary | ICD-10-CM

## 2019-10-07 DIAGNOSIS — I10 Essential (primary) hypertension: Secondary | ICD-10-CM

## 2019-10-07 DIAGNOSIS — Z113 Encounter for screening for infections with a predominantly sexual mode of transmission: Secondary | ICD-10-CM

## 2019-10-07 MED ORDER — FLUCONAZOLE 150 MG TABLET
150 mg | ORAL_TABLET | Freq: Once | ORAL | 1 refills | Status: AC
Start: 2019-10-07 — End: ?

## 2019-10-07 NOTE — Progress Notes
HISTORY: 29 year old female comes in today with some vaginal symptoms.  However she got her period and will be difficult to evaluate.  Patient recently came up and separated from her husband.  She does have a new partner.PHYSICAL EXAM:  On exam no lesions seen.  Blood in the vagina uterus is normal sizeIMPRESSION: question yeastPLAN: will treat empirically with Diflucan but will check GC and chlamydia

## 2019-10-09 NOTE — Other
Negative GC and Chlamydia.

## 2019-10-11 DIAGNOSIS — Z419 Encounter for procedure for purposes other than remedying health state, unspecified: Secondary | ICD-10-CM | POA: Diagnosis not present

## 2019-11-11 DIAGNOSIS — Z419 Encounter for procedure for purposes other than remedying health state, unspecified: Secondary | ICD-10-CM | POA: Diagnosis not present

## 2019-12-11 DIAGNOSIS — Z419 Encounter for procedure for purposes other than remedying health state, unspecified: Secondary | ICD-10-CM | POA: Diagnosis not present

## 2019-12-16 ENCOUNTER — Encounter: Payer: Self-pay | Admitting: Obstetrics and Gynecology

## 2019-12-29 ENCOUNTER — Ambulatory Visit: Payer: Medicaid Other | Admitting: Hospice and Palliative Medicine

## 2020-01-11 DIAGNOSIS — Z419 Encounter for procedure for purposes other than remedying health state, unspecified: Secondary | ICD-10-CM | POA: Diagnosis not present

## 2020-01-12 ENCOUNTER — Encounter: Payer: Self-pay | Admitting: Obstetrics and Gynecology
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
## 2020-01-13 ENCOUNTER — Encounter: Payer: Self-pay | Admitting: Obstetrics and Gynecology

## 2020-01-13 ENCOUNTER — Other Ambulatory Visit: Payer: Self-pay

## 2020-01-13 ENCOUNTER — Ambulatory Visit (INDEPENDENT_AMBULATORY_CARE_PROVIDER_SITE_OTHER): Payer: Medicaid Other | Admitting: Obstetrics and Gynecology

## 2020-01-13 ENCOUNTER — Other Ambulatory Visit (HOSPITAL_COMMUNITY): Admission: RE | Admit: 2020-01-13 | Payer: Self-pay | Source: Ambulatory Visit

## 2020-01-13 VITALS — BP 130/90 | Ht 65.0 in | Wt 203.0 lb

## 2020-01-13 DIAGNOSIS — N970 Female infertility associated with anovulation: Secondary | ICD-10-CM

## 2020-01-13 DIAGNOSIS — Z0000 Encounter for general adult medical examination without abnormal findings: Secondary | ICD-10-CM | POA: Diagnosis not present

## 2020-01-13 DIAGNOSIS — Z01419 Encounter for gynecological examination (general) (routine) without abnormal findings: Secondary | ICD-10-CM

## 2020-01-13 DIAGNOSIS — Z124 Encounter for screening for malignant neoplasm of cervix: Secondary | ICD-10-CM

## 2020-01-13 DIAGNOSIS — Z1151 Encounter for screening for human papillomavirus (HPV): Secondary | ICD-10-CM | POA: Diagnosis not present

## 2020-01-13 DIAGNOSIS — E282 Polycystic ovarian syndrome: Secondary | ICD-10-CM

## 2020-01-13 NOTE — Patient Instructions (Signed)
I value your feedback and entrusting us with your care. If you get a [facility] patient survey, I would appreciate you taking the time to let us know about your experience today. Thank you!  As of February 19, 2019, your lab results will be released to your MyChart immediately, before I even have a chance to see them. Please give me time to review them and contact you if there are any abnormalities. Thank you for your patience.  

## 2020-01-14 LAB — CYTOLOGY - PAP
Comment: NEGATIVE
Diagnosis: NEGATIVE
High risk HPV: NEGATIVE

## 2020-02-01 ENCOUNTER — Ambulatory Visit (INDEPENDENT_AMBULATORY_CARE_PROVIDER_SITE_OTHER): Payer: Medicaid Other | Admitting: Hospice and Palliative Medicine

## 2020-02-01 ENCOUNTER — Encounter: Payer: Self-pay | Admitting: Hospice and Palliative Medicine

## 2020-02-01 ENCOUNTER — Other Ambulatory Visit: Payer: Self-pay

## 2020-02-01 DIAGNOSIS — I10 Essential (primary) hypertension: Secondary | ICD-10-CM

## 2020-02-01 DIAGNOSIS — E119 Type 2 diabetes mellitus without complications: Secondary | ICD-10-CM

## 2020-02-01 DIAGNOSIS — Z0001 Encounter for general adult medical examination with abnormal findings: Secondary | ICD-10-CM

## 2020-02-01 DIAGNOSIS — F4310 Post-traumatic stress disorder, unspecified: Secondary | ICD-10-CM

## 2020-02-01 LAB — POCT GLYCOSYLATED HEMOGLOBIN (HGB A1C): Hemoglobin A1C: 5.6 % (ref 4.0–5.6)
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
## 2020-02-06 ENCOUNTER — Encounter: Payer: Self-pay | Admitting: Hospice and Palliative Medicine

## 2020-02-10 ENCOUNTER — Other Ambulatory Visit: Payer: Self-pay | Admitting: Hospice and Palliative Medicine

## 2020-02-10 DIAGNOSIS — Z419 Encounter for procedure for purposes other than remedying health state, unspecified: Secondary | ICD-10-CM | POA: Diagnosis not present

## 2020-02-10 MED ORDER — VALACYCLOVIR HCL 500 MG PO TABS: 500.0000 mg | ORAL_TABLET | Freq: Every day | ORAL | 1 refills | Status: DC

## 2020-02-29 ENCOUNTER — Encounter: Payer: Self-pay | Admitting: Hospice and Palliative Medicine

## 2020-02-29 NOTE — Telephone Encounter (Signed)
Please schedule for tomorrow with me at 42

## 2020-02-29 NOTE — Telephone Encounter (Signed)
Follow up after december 30th

## 2020-03-12 DIAGNOSIS — Z419 Encounter for procedure for purposes other than remedying health state, unspecified: Secondary | ICD-10-CM | POA: Diagnosis not present

## 2020-03-21 ENCOUNTER — Encounter: Payer: Self-pay | Admitting: Hospice and Palliative Medicine

## 2020-03-21 ENCOUNTER — Other Ambulatory Visit: Payer: Self-pay

## 2020-03-21 ENCOUNTER — Ambulatory Visit (INDEPENDENT_AMBULATORY_CARE_PROVIDER_SITE_OTHER): Payer: Medicaid Other | Admitting: Hospice and Palliative Medicine

## 2020-03-21 VITALS — BP 144/86 | HR 93 | Temp 97.3°F | Resp 16 | Ht 65.0 in | Wt 215.0 lb

## 2020-03-21 DIAGNOSIS — M2540 Effusion, unspecified joint: Secondary | ICD-10-CM | POA: Diagnosis not present

## 2020-03-21 DIAGNOSIS — R0600 Dyspnea, unspecified: Secondary | ICD-10-CM

## 2020-03-21 DIAGNOSIS — K219 Gastro-esophageal reflux disease without esophagitis: Secondary | ICD-10-CM

## 2020-03-21 DIAGNOSIS — R0789 Other chest pain: Secondary | ICD-10-CM | POA: Diagnosis not present

## 2020-03-21 DIAGNOSIS — R140 Abdominal distension (gaseous): Secondary | ICD-10-CM

## 2020-03-21 DIAGNOSIS — R0609 Other forms of dyspnea: Secondary | ICD-10-CM

## 2020-03-21 DIAGNOSIS — I10 Essential (primary) hypertension: Secondary | ICD-10-CM

## 2020-03-21 MED ORDER — OMEPRAZOLE 20 MG PO CPDR: 20.0000 mg | DELAYED_RELEASE_CAPSULE | Freq: Every day | ORAL | 3 refills | Status: DC

## 2020-03-21 MED ORDER — HYDROCHLOROTHIAZIDE 12.5 MG PO TABS: 25.0000 mg | ORAL_TABLET | Freq: Every day | ORAL | 0 refills | Status: DC
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
## 2020-04-01 ENCOUNTER — Telehealth: Payer: Self-pay

## 2020-04-01 ENCOUNTER — Other Ambulatory Visit: Payer: BC Managed Care – PPO

## 2020-04-01 NOTE — Telephone Encounter (Signed)
Called pt to verify primary insurance coverage for authorizations and pt advised she would like to cancel due to price and she was not able to do labwork due to cost so she was not going to get those done either, I have advised provider and if pt is not able to come for testing and proper work up we will follow up at next office visit and if she is unable to make that appointment a d,c letter will be sent out. Monique Forbes

## 2020-04-12 DIAGNOSIS — Z419 Encounter for procedure for purposes other than remedying health state, unspecified: Secondary | ICD-10-CM | POA: Diagnosis not present

## 2020-04-13 ENCOUNTER — Other Ambulatory Visit: Payer: BC Managed Care – PPO

## 2020-04-21 ENCOUNTER — Other Ambulatory Visit: Payer: Self-pay

## 2020-04-21 DIAGNOSIS — R0609 Other forms of dyspnea: Secondary | ICD-10-CM

## 2020-04-21 DIAGNOSIS — I10 Essential (primary) hypertension: Secondary | ICD-10-CM

## 2020-04-21 DIAGNOSIS — R0600 Dyspnea, unspecified: Secondary | ICD-10-CM

## 2020-04-21 MED ORDER — AMLODIPINE BESYLATE 5 MG PO TABS
5.0000 mg | ORAL_TABLET | Freq: Every day | ORAL | 1 refills | Status: DC
Start: 2020-04-21 — End: 2020-10-18

## 2020-04-21 MED ORDER — METFORMIN HCL ER (MOD) 1000 MG PO TB24: 1000.0000 mg | ORAL_TABLET | Freq: Every day | ORAL | 1 refills | Status: DC

## 2020-04-21 MED ORDER — HYDROCHLOROTHIAZIDE 12.5 MG PO TABS: 25.0000 mg | ORAL_TABLET | Freq: Every day | ORAL | 0 refills | Status: DC

## 2020-05-03 ENCOUNTER — Encounter: Payer: Medicaid Other | Admitting: Hospice and Palliative Medicine

## 2020-05-06 ENCOUNTER — Telehealth: Payer: Self-pay

## 2020-05-06 NOTE — Telephone Encounter (Signed)
Pt needs appt with MD. Pls call to sched her. Thx

## 2020-05-06 NOTE — Telephone Encounter (Signed)
Pt calling; trying to conceive x 39yrs; will start cycle in a couple of days.  Can she be put on clomid or something?  [phone]  Pt states could not afford Infertility Specialist; states they sugg she talk to [place] about getting clomid or something like it.  Pharm correct in chart.

## 2020-05-09 ENCOUNTER — Other Ambulatory Visit: Payer: Self-pay

## 2020-05-09 ENCOUNTER — Encounter: Payer: Self-pay | Admitting: Obstetrics & Gynecology

## 2020-05-09 ENCOUNTER — Ambulatory Visit (INDEPENDENT_AMBULATORY_CARE_PROVIDER_SITE_OTHER): Payer: Medicaid Other | Admitting: Obstetrics & Gynecology

## 2020-05-09 VITALS — BP 120/80 | Ht 65.0 in | Wt 215.0 lb

## 2020-05-09 DIAGNOSIS — E282 Polycystic ovarian syndrome: Secondary | ICD-10-CM

## 2020-05-09 DIAGNOSIS — N970 Female infertility associated with anovulation: Secondary | ICD-10-CM | POA: Diagnosis not present

## 2020-05-09 MED ORDER — CLOMIPHENE CITRATE 50 MG PO TABS: 50.0000 mg | ORAL_TABLET | Freq: Every day | ORAL | 0 refills | Status: DC

## 2020-05-09 MED ORDER — METFORMIN HCL 1000 MG PO TABS: 1000.0000 mg | ORAL_TABLET | Freq: Two times a day (BID) | ORAL | 11 refills | Status: DC

## 2020-05-09 NOTE — Telephone Encounter (Signed)
Patient is scheduled for 05/09/20

## 2020-05-09 NOTE — Patient Instructions (Signed)
Clomiphene tablets PLAN: Period- take day 3-7 (nightly for 5 nights) If no period by next week, will give medicine for period, then above METFORMIN 1000 mg twice daily PROGESTERONE once pregnant   What is this medicine? CLOMIPHENE (KLOE mi feen) is a fertility drug that increases the chance of pregnancy. It helps women ovulate (produce a mature egg) during their cycle. This medicine may be used for other purposes; ask your health care provider or pharmacist if you have questions. COMMON BRAND NAME(S): Clomid, Serophene What should I tell my health care provider before I take this medicine? They need to know if you have any of these conditions:  adrenal gland disease  blood vessel disease or blood clots  cyst on the ovary  endometriosis  liver disease  ovarian cancer  pituitary gland disease  vaginal bleeding that has not been evaluated  an unusual or allergic reaction to clomiphene, other medicines, foods, dyes, or preservatives  pregnant (should not be used if you are already pregnant)  breast-feeding How should I use this medicine? Take this medicine by mouth with a glass of water. Follow the directions on the prescription label. Take exactly as directed for the exact number of days prescribed. Take your doses at regular intervals. Most women take this medicine for a 5 day period, but the length of treatment may be adjusted. Your doctor will give you a start date for this medication and will give you instructions on proper use. Do not take your medicine more often than directed. Talk to your pediatrician regarding the use of this medicine in children. Special care may be needed. Overdosage: If you think you have taken too much of this medicine contact a poison control center or emergency room at once. NOTE: This medicine is only for you. Do not share this medicine with others. What if I miss a dose? If you miss a dose, take it as soon as you can. If it is almost time for  your next dose, take only that dose. Do not take double or extra doses. What may interact with this medicine?  herbal or dietary supplements, like blue cohosh, black cohosh, chasteberry, or DHEA  prasterone This list may not describe all possible interactions. Give your health care provider a list of all the medicines, herbs, non-prescription drugs, or dietary supplements you use. Also tell them if you smoke, drink alcohol, or use illegal drugs. Some items may interact with your medicine. What should I watch for while using this medicine? Make sure you understand how and when to use this medicine. You need to know when you are ovulating and when to have sexual intercourse. This will increase the chance of a pregnancy. Visit your doctor or health care professional for regular checks on your progress. You may need tests to check the hormone levels in your blood or you may have to use home-urine tests to check for ovulation. Try to keep any appointments. Compared to other fertility treatments, this medicine does not greatly increase your chances of having multiple babies. An increased chance of having twins may occur in roughly 5 out of every 100 women who take this medication. Stop taking this medicine at once and contact your doctor or health care professional if you think you are pregnant. This medicine is not for long-term use. Most women that benefit from this medicine do so within the first three cycles (months). Your doctor or health care professional will monitor your condition. This medicine is usually used for a total  of 6 cycles of treatment. You may get drowsy or dizzy. Do not drive, use machinery, or do anything that needs mental alertness until you know how this drug affects you. Do not stand or sit up quickly. This reduces the risk of dizzy or fainting spells. Drinking alcoholic beverages or smoking tobacco may decrease your chance of becoming pregnant. Limit or stop alcohol and tobacco use  during your fertility treatments. What side effects may I notice from receiving this medicine? Side effects that you should report to your doctor or health care professional as soon as possible:  allergic reactions like skin rash, itching or hives, swelling of the face, lips, or tongue  breathing problems  changes in vision  fluid retention  nausea, vomiting  pelvic pain or bloating  severe abdominal pain  sudden weight gain Side effects that usually do not require medical attention (report to your doctor or health care professional if they continue or are bothersome):  breast discomfort  hot flashes  mild pelvic discomfort  mild nausea This list may not describe all possible side effects. Call your doctor for medical advice about side effects. You may report side effects to FDA at 1-800-FDA-1088. Where should I keep my medicine? Keep out of the reach of children. Store at room temperature between 15 and 30 degrees C (59 and 86 degrees F). Protect from heat, light, and moisture. Throw away any unused medicine after the expiration date. NOTE: This sheet is a summary. It may not cover all possible information. If you have questions about this medicine, talk to your doctor, pharmacist, or health care provider.  2021 Elsevier/Gold Standard (2007-06-09 22:21:06)
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
## 2020-05-10 DIAGNOSIS — Z419 Encounter for procedure for purposes other than remedying health state, unspecified: Secondary | ICD-10-CM | POA: Diagnosis not present

## 2020-05-17 ENCOUNTER — Other Ambulatory Visit: Payer: Self-pay

## 2020-05-17 ENCOUNTER — Ambulatory Visit (INDEPENDENT_AMBULATORY_CARE_PROVIDER_SITE_OTHER): Payer: Medicaid Other | Admitting: Obstetrics & Gynecology

## 2020-05-17 ENCOUNTER — Encounter: Payer: Self-pay | Admitting: Obstetrics & Gynecology

## 2020-05-17 VITALS — Ht 65.0 in | Wt 215.0 lb

## 2020-05-17 DIAGNOSIS — N970 Female infertility associated with anovulation: Secondary | ICD-10-CM

## 2020-05-17 DIAGNOSIS — E282 Polycystic ovarian syndrome: Secondary | ICD-10-CM | POA: Diagnosis not present

## 2020-05-17 MED ORDER — MEDROXYPROGESTERONE ACETATE 10 MG PO TABS: 10.0000 mg | ORAL_TABLET | Freq: Every day | ORAL | 0 refills | Status: DC

## 2020-05-17 NOTE — [place] Notes (Signed)
Virtual Visit via Telephone Note  I connected with Monique Forbes on 05/17/20 at  4:10 PM EST by telephone and verified that I am speaking with the correct person using two identifiers.  Location: Patient: Home Provider: Office   I discussed the limitations, risks, security and privacy concerns of performing an evaluation and management service by telephone and the availability of in person appointments. I also discussed with the patient that there may be a patient responsible charge related to this service. The patient expressed understanding and agreed to proceed.  History of Present Illness: Clomid Follow Up Pt has PCOS and has ben trying for pregnancy for several years.  SHe has irreg periods, last one 2 mos ago.  She has PCOS type lab picture and desires medicine to help w fertility.   No other meds, except PNV and Metformin    Tolerates Metformin at higher dose well  LMP- 04/12/20   Often has 56-80 day intervals   Observations/Objective: No exam today, due to telephone eVisit due to [facility] virus restriction on elective visits and procedures.  Prior visits reviewed along with ultrasounds/labs as indicated.  Assessment and Plan:   ICD-10-CM   1. PCOS (polycystic ovarian syndrome)  E28.2   2. Infertility associated with anovulation  N97.0    Counseled on plan.    Provera to induce period       If successful (or even if not) then next step is Clomid    Clomid 50mg  this cycle, Rx done.    Cont OPK.    Monitor for cycle/ uCG testing. Plan progesterone supplementation once pregnant Cont Metformin 1000 mg BID  Follow up for pos preg test or call with period/neg testing.   I discussed the assessment and treatment plan with the patient. The patient was provided an opportunity to ask questions and all were answered. The patient agreed with the plan and demonstrated an understanding of the instructions.   The patient was advised to call back or seek an in-person evaluation if the  symptoms worsen or if the condition fails to improve as anticipated.  I provided 15 minutes of non-face-to-face time during this encounter.   , MD

## 2020-05-26 ENCOUNTER — Other Ambulatory Visit: Payer: Self-pay

## 2020-05-26 ENCOUNTER — Ambulatory Visit: Payer: Medicaid Other | Admitting: Physician Assistant

## 2020-05-26 ENCOUNTER — Encounter: Payer: Self-pay | Admitting: Physician Assistant

## 2020-05-26 DIAGNOSIS — R0982 Postnasal drip: Secondary | ICD-10-CM | POA: Diagnosis not present

## 2020-05-26 DIAGNOSIS — I10 Essential (primary) hypertension: Secondary | ICD-10-CM

## 2020-05-26 DIAGNOSIS — H9202 Otalgia, left ear: Secondary | ICD-10-CM | POA: Diagnosis not present
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
## 2020-05-27 DIAGNOSIS — M2540 Effusion, unspecified joint: Secondary | ICD-10-CM | POA: Diagnosis not present

## 2020-05-27 DIAGNOSIS — Z0001 Encounter for general adult medical examination with abnormal findings: Secondary | ICD-10-CM | POA: Diagnosis not present

## 2020-05-27 DIAGNOSIS — R140 Abdominal distension (gaseous): Secondary | ICD-10-CM | POA: Diagnosis not present

## 2020-05-28 LAB — COMPREHENSIVE METABOLIC PANEL
ALT: 30 IU/L (ref 0–32)
AST: 20 IU/L (ref 0–40)
Albumin/Globulin Ratio: 1.5 (ref 1.2–2.2)
Albumin: 4.3 g/dL (ref 3.8–4.8)
Alkaline Phosphatase: 32 IU/L — ABNORMAL LOW (ref 44–121)
BUN/Creatinine Ratio: 15 (ref 9–23)
BUN: 10 mg/dL (ref 6–20)
Bilirubin Total: 0.4 mg/dL (ref 0.0–1.2)
CO2: 19 mmol/L — ABNORMAL LOW (ref 20–29)
Calcium: 9.3 mg/dL (ref 8.7–10.2)
Chloride: 100 mmol/L (ref 96–106)
Creatinine, Ser: 0.66 mg/dL (ref 0.57–1.00)
Globulin, Total: 2.9 g/dL (ref 1.5–4.5)
Glucose: 106 mg/dL — ABNORMAL HIGH (ref 65–99)
Potassium: 4.3 mmol/L (ref 3.5–5.2)
Sodium: 141 mmol/L (ref 134–144)
Total Protein: 7.2 g/dL (ref 6.0–8.5)
eGFR: 120 mL/min/{1.73_m2} (ref >59)

## 2020-05-28 LAB — CBC WITH DIFFERENTIAL/PLATELET
Basophils Absolute: 0.1 10*3/uL (ref 0.0–0.2)
Basos: 1 %
EOS (ABSOLUTE): 0.3 10*3/uL (ref 0.0–0.4)
Eos: 5 %
Hematocrit: 38.6 % (ref 34.0–46.6)
Hemoglobin: 13 g/dL (ref 11.1–15.9)
Immature Grans (Abs): 0 10*3/uL (ref 0.0–0.1)
Immature Granulocytes: 0 %
Lymphocytes Absolute: 2.8 10*3/uL (ref 0.7–3.1)
Lymphs: 39 %
MCH: 28.1 pg (ref 26.6–33.0)
MCHC: 33.7 g/dL (ref 31.5–35.7)
MCV: 84 fL (ref 79–97)
Monocytes Absolute: 0.5 10*3/uL (ref 0.1–0.9)
Monocytes: 7 %
Neutrophils Absolute: 3.5 10*3/uL (ref 1.4–7.0)
Neutrophils: 48 %
Platelets: 395 10*3/uL (ref 150–450)
RBC: 4.62 x10E6/uL (ref 3.77–5.28)
RDW: 12.2 % (ref 11.7–15.4)
WBC: 7.3 10*3/uL (ref 3.4–10.8)

## 2020-05-28 LAB — HCG, SERUM, QUALITATIVE: hCG,Beta Subunit,Qual,Serum: NEGATIVE m[IU]/mL (ref <6)

## 2020-05-28 LAB — LIPID PANEL WITH LDL/HDL RATIO
Cholesterol, Total: 121 mg/dL (ref 100–199)
HDL: 47 mg/dL (ref >39)
LDL Chol Calc (NIH): 43 mg/dL (ref 0–99)
LDL/HDL Ratio: 0.9 ratio (ref 0.0–3.2)
Triglycerides: 193 mg/dL — ABNORMAL HIGH (ref 0–149)
VLDL Cholesterol Cal: 31 mg/dL (ref 5–40)

## 2020-05-28 LAB — VITAMIN B12: Vitamin B-12: 665 pg/mL (ref 232–1245)

## 2020-05-28 LAB — TSH+FREE T4
Free T4: 1.11 ng/dL (ref 0.82–1.77)
TSH: 3.05 u[IU]/mL (ref 0.450–4.500)

## 2020-05-28 LAB — RHEUMATOID FACTOR: Rheumatoid fact SerPl-aCnc: <10 IU/mL (ref <14.0)

## 2020-05-30 ENCOUNTER — Ambulatory Visit: Payer: Medicaid Other | Admitting: Hospice and Palliative Medicine

## 2020-05-30 ENCOUNTER — Other Ambulatory Visit: Payer: Self-pay

## 2020-05-30 ENCOUNTER — Encounter: Payer: Self-pay | Admitting: Hospice and Palliative Medicine

## 2020-05-30 VITALS — BP 136/104 | HR 81 | Temp 97.4°F | Resp 16 | Ht 65.0 in | Wt 215.0 lb

## 2020-05-30 DIAGNOSIS — E119 Type 2 diabetes mellitus without complications: Secondary | ICD-10-CM | POA: Diagnosis not present

## 2020-05-30 DIAGNOSIS — Z0001 Encounter for general adult medical examination with abnormal findings: Secondary | ICD-10-CM

## 2020-05-30 DIAGNOSIS — I10 Essential (primary) hypertension: Secondary | ICD-10-CM

## 2020-05-30 LAB — POCT GLYCOSYLATED HEMOGLOBIN (HGB A1C): Hemoglobin A1C: 5.5 % (ref 4.0–5.6)

## 2020-05-30 MED ORDER — OLMESARTAN MEDOXOMIL 5 MG PO TABS: 5.0000 mg | ORAL_TABLET | Freq: Every day | ORAL | 0 refills | Status: DC

## 2020-05-30 NOTE — Progress Notes (Signed)
Labs reviewed at visit.
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
## 2020-05-31 LAB — ANA: ANA Titer 1: NEGATIVE

## 2020-05-31 LAB — SEDIMENTATION RATE: Sed Rate: 12 mm/hr (ref 0–32)

## 2020-06-06 DIAGNOSIS — F4312 Post-traumatic stress disorder, chronic: Secondary | ICD-10-CM | POA: Diagnosis not present

## 2020-06-09 ENCOUNTER — Other Ambulatory Visit: Payer: Self-pay

## 2020-06-09 ENCOUNTER — Encounter: Payer: Self-pay | Admitting: Emergency Medicine

## 2020-06-09 DIAGNOSIS — Z5321 Procedure and treatment not carried out due to patient leaving prior to being seen by health care provider: Secondary | ICD-10-CM | POA: Diagnosis not present

## 2020-06-09 DIAGNOSIS — F332 Major depressive disorder, recurrent severe without psychotic features: Secondary | ICD-10-CM | POA: Diagnosis not present

## 2020-06-09 DIAGNOSIS — Z743 Need for continuous supervision: Secondary | ICD-10-CM | POA: Diagnosis not present

## 2020-06-09 DIAGNOSIS — F4312 Post-traumatic stress disorder, chronic: Secondary | ICD-10-CM | POA: Diagnosis not present

## 2020-06-09 DIAGNOSIS — R55 Syncope and collapse: Secondary | ICD-10-CM | POA: Insufficient documentation

## 2020-06-09 DIAGNOSIS — R42 Dizziness and giddiness: Secondary | ICD-10-CM | POA: Diagnosis not present

## 2020-06-09 DIAGNOSIS — R404 Transient alteration of awareness: Secondary | ICD-10-CM | POA: Diagnosis not present

## 2020-06-09 LAB — URINALYSIS, COMPLETE (UACMP) WITH MICROSCOPIC
Bacteria, UA: NONE SEEN
Bilirubin Urine: NEGATIVE
Glucose, UA: NEGATIVE mg/dL
Hgb urine dipstick: NEGATIVE
Ketones, ur: 5 mg/dL — AB
Leukocytes,Ua: NEGATIVE
Nitrite: NEGATIVE
Protein, ur: 100 mg/dL — AB
Specific Gravity, Urine: 1.026 (ref 1.005–1.030)
pH: 5 (ref 5.0–8.0)

## 2020-06-09 LAB — CBC WITH DIFFERENTIAL/PLATELET
Abs Immature Granulocytes: 0.05 10*3/uL (ref 0.00–0.07)
Basophils Absolute: 0.1 10*3/uL (ref 0.0–0.1)
Basophils Relative: 1 %
Eosinophils Absolute: 0.5 10*3/uL (ref 0.0–0.5)
Eosinophils Relative: 5 %
HCT: 38.3 % (ref 36.0–46.0)
Hemoglobin: 12.8 g/dL (ref 12.0–15.0)
Immature Granulocytes: 1 %
Lymphocytes Relative: 41 %
Lymphs Abs: 3.7 10*3/uL (ref 0.7–4.0)
MCH: 28.1 pg (ref 26.0–34.0)
MCHC: 33.4 g/dL (ref 30.0–36.0)
MCV: 84.2 fL (ref 80.0–100.0)
Monocytes Absolute: 0.6 10*3/uL (ref 0.1–1.0)
Monocytes Relative: 7 %
Neutro Abs: 4 10*3/uL (ref 1.7–7.7)
Neutrophils Relative %: 45 %
Platelets: 365 10*3/uL (ref 150–400)
RBC: 4.55 MIL/uL (ref 3.87–5.11)
RDW: 12.1 % (ref 11.5–15.5)
WBC: 8.9 10*3/uL (ref 4.0–10.5)
nRBC: 0 % (ref 0.0–0.2)

## 2020-06-09 LAB — POC URINE PREG, ED: Preg Test, Ur: NEGATIVE

## 2020-06-09 NOTE — ED Triage Notes (Addendum)
EMS brings pt in frome home for dizziness & syncopal episode PTA; pt to triage via w/c with no distress noted; denies any recent illness; denies symptoms at present except for nausea

## 2020-06-10 ENCOUNTER — Emergency Department
Admission: EM | Admit: 2020-06-10 | Discharge: 2020-06-10 | Disposition: A | Discharge status: Home / Self Care | Payer: Medicaid Other | Attending: Emergency Medicine | Admitting: Emergency Medicine

## 2020-06-10 DIAGNOSIS — Z419 Encounter for procedure for purposes other than remedying health state, unspecified: Secondary | ICD-10-CM | POA: Diagnosis not present

## 2020-06-10 LAB — COMPREHENSIVE METABOLIC PANEL
ALT: 37 U/L (ref 0–44)
AST: 33 U/L (ref 15–41)
Albumin: 3.9 g/dL (ref 3.5–5.0)
Alkaline Phosphatase: 26 U/L — ABNORMAL LOW (ref 38–126)
Anion gap: 10 (ref 5–15)
BUN: 14 mg/dL (ref 6–20)
CO2: 22 mmol/L (ref 22–32)
Calcium: 9.1 mg/dL (ref 8.9–10.3)
Chloride: 106 mmol/L (ref 98–111)
Creatinine, Ser: 0.57 mg/dL (ref 0.44–1.00)
GFR, Estimated: >60 mL/min (ref >60)
Glucose, Bld: 129 mg/dL — ABNORMAL HIGH (ref 70–99)
Potassium: 3.6 mmol/L (ref 3.5–5.1)
Sodium: 138 mmol/L (ref 135–145)
Total Bilirubin: 0.8 mg/dL (ref 0.3–1.2)
Total Protein: 7.4 g/dL (ref 6.5–8.1)

## 2020-06-10 LAB — TROPONIN I (HIGH SENSITIVITY): Troponin I (High Sensitivity): 3 ng/L (ref <18)

## 2020-06-13 ENCOUNTER — Telehealth: Payer: Self-pay

## 2020-06-13 NOTE — Telephone Encounter (Signed)
Transition Care Management Follow-up Telephone Call  Date of discharge and from where: 06/10/2020 from [facility]  How have you been since you were released from the hospital? Pt stated that she is okay but still feeling a little dizzy   Any questions or concerns? No  Items Reviewed:  Did the pt receive and understand the discharge instructions provided? Yes   Medications obtained and verified? Yes   Other? No   Any new allergies since your discharge? No   Dietary orders reviewed? N/a  Do you have support at home? Yes   Functional Questionnaire: (I = Independent and D = Dependent) ADLs: I  Bathing/Dressing- I  Meal Prep- I  Eating- I  Maintaining continence- I  Transferring/Ambulation- I  Managing Meds- I   Follow up appointments reviewed:   PCP Hospital f/u appt confirmed? No  Scheduled to see Leeanne Deed, NP on 06/27/2020. Pt is calling today for a sooner appt.   Specialist Hospital f/u appt confirmed? No    Are transportation arrangements needed? No   If their condition worsens, is the pt aware to call PCP or go to the Emergency Dept.? Yes  Was the patient provided with contact information for the PCP's office or ED? Yes  Was to pt encouraged to call back with questions or concerns? Yes

## 2020-06-15 ENCOUNTER — Other Ambulatory Visit: Payer: Self-pay | Admitting: Hospice and Palliative Medicine

## 2020-06-16 ENCOUNTER — Other Ambulatory Visit: Payer: Self-pay

## 2020-06-16 ENCOUNTER — Ambulatory Visit: Payer: Medicaid Other | Admitting: Hospice and Palliative Medicine

## 2020-06-16 ENCOUNTER — Encounter: Payer: Self-pay | Admitting: Hospice and Palliative Medicine

## 2020-06-16 VITALS — BP 128/90 | HR 88 | Temp 97.6°F | Resp 16 | Ht 65.0 in | Wt 218.6 lb

## 2020-06-16 DIAGNOSIS — N926 Irregular menstruation, unspecified: Secondary | ICD-10-CM

## 2020-06-16 DIAGNOSIS — E282 Polycystic ovarian syndrome: Secondary | ICD-10-CM

## 2020-06-16 DIAGNOSIS — R569 Unspecified convulsions: Secondary | ICD-10-CM | POA: Diagnosis not present

## 2020-06-16 LAB — POCT URINE PREGNANCY: Preg Test, Ur: NEGATIVE

## 2020-06-16 MED ORDER — METFORMIN HCL ER 500 MG PO TB24: 500.0000 mg | ORAL_TABLET | Freq: Every day | ORAL | 1 refills | Status: DC

## 2020-06-16 MED ORDER — SPIRONOLACTONE 25 MG PO TABS: 12.5000 mg | ORAL_TABLET | Freq: Every day | ORAL | 1 refills | Status: DC
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
## 2020-06-17 ENCOUNTER — Telehealth: Payer: Self-pay

## 2020-06-17 NOTE — Telephone Encounter (Signed)
Advised patient of CT scheduled at  [facility] on 07/06/20. Toni Amend

## 2020-06-18 ENCOUNTER — Encounter: Payer: Self-pay | Admitting: Hospice and Palliative Medicine

## 2020-06-24 ENCOUNTER — Ambulatory Visit
Admission: EM | Admit: 2020-06-24 | Discharge: 2020-06-24 | Disposition: A | Discharge status: Home / Self Care | Payer: Medicaid Other

## 2020-06-24 ENCOUNTER — Encounter: Payer: Self-pay | Admitting: Emergency Medicine

## 2020-06-24 ENCOUNTER — Other Ambulatory Visit: Payer: Self-pay

## 2020-06-24 DIAGNOSIS — G44319 Acute post-traumatic headache, not intractable: Secondary | ICD-10-CM | POA: Diagnosis not present

## 2020-06-24 DIAGNOSIS — R110 Nausea: Secondary | ICD-10-CM
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
## 2020-06-24 NOTE — ED Notes (Signed)
.  uctoed  

## 2020-06-24 NOTE — ED Notes (Signed)
Patient is being discharged from the Urgent Care and sent to the Emergency Department via pov. Per [place] Worse patient is in need of higher level of care due to head injury. Patient is aware and verbalizes understanding of plan of care.  Vitals:   06/24/20 1101  BP: (!) 142/92  Pulse: 90  Resp: 19  Temp: 98 F (36.7 C)  SpO2: 96%

## 2020-06-24 NOTE — ED Triage Notes (Addendum)
Rash scattered throughout body that started last night.  Denies itching or pain.   Has had a headache x 4 days and scratchy throat.

## 2020-06-27 ENCOUNTER — Other Ambulatory Visit: Payer: Self-pay

## 2020-06-27 ENCOUNTER — Encounter: Payer: Self-pay | Admitting: Hospice and Palliative Medicine

## 2020-06-27 ENCOUNTER — Ambulatory Visit: Payer: Medicaid Other | Admitting: Hospice and Palliative Medicine

## 2020-06-27 VITALS — BP 140/80 | HR 100 | Temp 97.5°F | Resp 16 | Ht 65.0 in | Wt 224.4 lb

## 2020-06-27 DIAGNOSIS — Z87898 Personal history of other specified conditions: Secondary | ICD-10-CM | POA: Diagnosis not present

## 2020-06-27 DIAGNOSIS — I10 Essential (primary) hypertension: Secondary | ICD-10-CM | POA: Diagnosis not present

## 2020-06-27 DIAGNOSIS — E282 Polycystic ovarian syndrome: Secondary | ICD-10-CM

## 2020-06-27 DIAGNOSIS — R079 Chest pain, unspecified: Secondary | ICD-10-CM

## 2020-06-27 DIAGNOSIS — R21 Rash and other nonspecific skin eruption: Secondary | ICD-10-CM
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
## 2020-07-04 ENCOUNTER — Encounter: Payer: Self-pay | Admitting: Hospice and Palliative Medicine

## 2020-07-04 DIAGNOSIS — R079 Chest pain, unspecified: Secondary | ICD-10-CM | POA: Diagnosis not present

## 2020-07-04 DIAGNOSIS — N83202 Unspecified ovarian cyst, left side: Secondary | ICD-10-CM | POA: Diagnosis not present

## 2020-07-04 DIAGNOSIS — R21 Rash and other nonspecific skin eruption: Secondary | ICD-10-CM | POA: Diagnosis not present

## 2020-07-04 DIAGNOSIS — R748 Abnormal levels of other serum enzymes: Secondary | ICD-10-CM | POA: Diagnosis not present

## 2020-07-04 DIAGNOSIS — R1013 Epigastric pain: Secondary | ICD-10-CM | POA: Diagnosis not present

## 2020-07-04 DIAGNOSIS — N289 Disorder of kidney and ureter, unspecified: Secondary | ICD-10-CM | POA: Diagnosis not present

## 2020-07-04 DIAGNOSIS — R599 Enlarged lymph nodes, unspecified: Secondary | ICD-10-CM | POA: Diagnosis not present

## 2020-07-04 DIAGNOSIS — R0789 Other chest pain: Secondary | ICD-10-CM | POA: Diagnosis not present

## 2020-07-04 DIAGNOSIS — K760 Fatty (change of) liver, not elsewhere classified: Secondary | ICD-10-CM | POA: Diagnosis not present

## 2020-07-05 ENCOUNTER — Telehealth: Payer: Self-pay | Admitting: *Deleted

## 2020-07-05 NOTE — Telephone Encounter (Signed)
Transition Care Management Follow-up Telephone Call  Date of discharge and from where: 07/04/2020 [facility] ED  How have you been since you were released from the hospital? "Alright"  Any questions or concerns? No  Items Reviewed:  Did the pt receive and understand the discharge instructions provided? Yes   Medications obtained and verified? Yes   Other? No   Any new allergies since your discharge? No   Dietary orders reviewed? No  Do you have support at home? Yes     Functional Questionnaire: (I = Independent and D = Dependent) ADLs: I  Bathing/Dressing- I  Meal Prep- I  Eating- I  Maintaining continence- I  Transferring/Ambulation- I  Managing Meds- I  Follow up appointments reviewed:   PCP Hospital f/u appt confirmed? Yes  Scheduled to see Monique Deed, NP on 07/25/2020 @ 1330.  Specialist Hospital f/u appt confirmed? No    Are transportation arrangements needed? No   If their condition worsens, is the pt aware to call PCP or go to the Emergency Dept.? Yes  Was the patient provided with contact information for the PCP's office or ED? Yes  Was to pt encouraged to call back with questions or concerns? Yes

## 2020-07-06 ENCOUNTER — Ambulatory Visit: Payer: Medicaid Other | Attending: Hospice and Palliative Medicine

## 2020-07-10 DIAGNOSIS — Z419 Encounter for procedure for purposes other than remedying health state, unspecified: Secondary | ICD-10-CM | POA: Diagnosis not present

## 2020-07-23 ENCOUNTER — Other Ambulatory Visit: Payer: Self-pay

## 2020-07-23 ENCOUNTER — Encounter: Payer: Self-pay | Admitting: Emergency Medicine

## 2020-07-23 ENCOUNTER — Ambulatory Visit
Admission: EM | Admit: 2020-07-23 | Discharge: 2020-07-23 | Disposition: A | Discharge status: Home / Self Care | Payer: Medicaid Other | Attending: Family Medicine | Admitting: Family Medicine

## 2020-07-23 DIAGNOSIS — J029 Acute pharyngitis, unspecified: Secondary | ICD-10-CM | POA: Diagnosis not present

## 2020-07-23 DIAGNOSIS — R509 Fever, unspecified: Secondary | ICD-10-CM | POA: Insufficient documentation

## 2020-07-23 DIAGNOSIS — J069 Acute upper respiratory infection, unspecified: Secondary | ICD-10-CM | POA: Diagnosis not present

## 2020-07-23 DIAGNOSIS — B349 Viral infection, unspecified: Secondary | ICD-10-CM | POA: Diagnosis not present

## 2020-07-23 LAB — POCT RAPID STREP A (OFFICE): Rapid Strep A Screen: NEGATIVE

## 2020-07-23 MED ORDER — PREDNISONE 10 MG (21) PO TBPK: ORAL_TABLET | Freq: Every day | ORAL | 0 refills | Status: AC
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
## 2020-07-23 NOTE — Discharge Instructions (Addendum)
Your rapid strep test is negative.  A throat culture is pending; we will call you if it is positive requiring treatment.    I have sent in a prednisone taper for you to take for 6 days. 6 tablets on day one, 5 tablets on day two, 4 tablets on day three, 3 tablets on day four, 2 tablets on day five, and 1 tablet on day six.  Follow up with this office or with primary care if symptoms are persisting.  Follow up in the ER for high fever, trouble swallowing, trouble breathing, other concerning symptoms.

## 2020-07-23 NOTE — ED Triage Notes (Signed)
Sore throat and fever since yesterday

## 2020-07-25 ENCOUNTER — Ambulatory Visit: Payer: Medicaid Other | Admitting: Nurse Practitioner

## 2020-07-25 ENCOUNTER — Telehealth: Payer: Self-pay

## 2020-07-25 ENCOUNTER — Encounter: Payer: Self-pay | Admitting: Nurse Practitioner

## 2020-07-25 DIAGNOSIS — J028 Acute pharyngitis due to other specified organisms: Secondary | ICD-10-CM

## 2020-07-25 DIAGNOSIS — B9689 Other specified bacterial agents as the cause of diseases classified elsewhere: Secondary | ICD-10-CM

## 2020-07-25 LAB — COVID-19, FLU A+B NAA
Influenza A, NAA: NOT DETECTED
Influenza B, NAA: NOT DETECTED
SARS-CoV-2, NAA: NOT DETECTED

## 2020-07-25 MED ORDER — AZITHROMYCIN 250 MG PO TABS: ORAL_TABLET | ORAL | 0 refills | Status: AC
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
## 2020-07-25 NOTE — Telephone Encounter (Signed)
Work note at the front desk lmom advising patient. Monique Forbes

## 2020-07-26 LAB — CULTURE, GROUP A STREP (THRC)

## 2020-07-28 ENCOUNTER — Ambulatory Visit: Payer: Medicaid Other | Admitting: Dermatology

## 2020-07-29 ENCOUNTER — Ambulatory Visit: Payer: Medicaid Other | Admitting: Nurse Practitioner

## 2020-07-31 ENCOUNTER — Other Ambulatory Visit: Payer: Self-pay

## 2020-07-31 ENCOUNTER — Ambulatory Visit
Admission: EM | Admit: 2020-07-31 | Discharge: 2020-07-31 | Disposition: A | Discharge status: Home / Self Care | Payer: Medicaid Other | Attending: Family Medicine | Admitting: Family Medicine

## 2020-07-31 ENCOUNTER — Encounter: Payer: Self-pay | Admitting: Emergency Medicine

## 2020-07-31 DIAGNOSIS — R0981 Nasal congestion: Secondary | ICD-10-CM | POA: Diagnosis not present

## 2020-07-31 DIAGNOSIS — J00 Acute nasopharyngitis [common cold]: Secondary | ICD-10-CM

## 2020-07-31 MED ORDER — PROMETHAZINE-DM 6.25-15 MG/5ML PO SYRP: 5.0000 mL | ORAL_SOLUTION | Freq: Four times a day (QID) | ORAL | 0 refills | Status: DC | PRN

## 2020-07-31 MED ORDER — LEVOCETIRIZINE DIHYDROCHLORIDE 5 MG PO TABS: 5.0000 mg | ORAL_TABLET | Freq: Every evening | ORAL | 0 refills | Status: DC

## 2020-07-31 MED ORDER — IPRATROPIUM BROMIDE 0.03 % NA SOLN: 2.0000 | Freq: Three times a day (TID) | NASAL | 0 refills | Status: DC | PRN
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
## 2020-07-31 NOTE — ED Triage Notes (Signed)
Pt temp 101.7.  Took 600 mg motrin this am.  Fever, congestion for over a week.  Test neg for flu on 5/14.  Was seen by PCP last week and she said that she tested positive for the flu on 5/18.  She was treated with tamiflu.  Needs a note for work.

## 2020-08-04 ENCOUNTER — Other Ambulatory Visit: Payer: Self-pay

## 2020-08-04 ENCOUNTER — Ambulatory Visit
Admission: EM | Admit: 2020-08-04 | Discharge: 2020-08-04 | Disposition: A | Discharge status: Home / Self Care | Payer: Medicaid Other

## 2020-08-04 ENCOUNTER — Encounter: Payer: Self-pay | Admitting: Emergency Medicine

## 2020-08-04 ENCOUNTER — Encounter (HOSPITAL_COMMUNITY): Payer: Self-pay | Admitting: Emergency Medicine

## 2020-08-04 ENCOUNTER — Emergency Department (HOSPITAL_COMMUNITY)
Admission: EM | Admit: 2020-08-04 | Discharge: 2020-08-04 | Disposition: A | Discharge status: Home / Self Care | Payer: Medicaid Other | Attending: Emergency Medicine | Admitting: Emergency Medicine

## 2020-08-04 ENCOUNTER — Emergency Department (HOSPITAL_COMMUNITY): Payer: Medicaid Other

## 2020-08-04 DIAGNOSIS — J45909 Unspecified asthma, uncomplicated: Secondary | ICD-10-CM | POA: Diagnosis not present

## 2020-08-04 DIAGNOSIS — Z8616 Personal history of COVID-19: Secondary | ICD-10-CM | POA: Insufficient documentation

## 2020-08-04 DIAGNOSIS — E119 Type 2 diabetes mellitus without complications: Secondary | ICD-10-CM | POA: Insufficient documentation

## 2020-08-04 DIAGNOSIS — R002 Palpitations: Secondary | ICD-10-CM | POA: Diagnosis not present

## 2020-08-04 DIAGNOSIS — J069 Acute upper respiratory infection, unspecified: Secondary | ICD-10-CM | POA: Insufficient documentation

## 2020-08-04 DIAGNOSIS — R0789 Other chest pain: Secondary | ICD-10-CM

## 2020-08-04 DIAGNOSIS — Z79899 Other long term (current) drug therapy: Secondary | ICD-10-CM | POA: Diagnosis not present

## 2020-08-04 DIAGNOSIS — R079 Chest pain, unspecified: Secondary | ICD-10-CM

## 2020-08-04 DIAGNOSIS — I10 Essential (primary) hypertension: Secondary | ICD-10-CM | POA: Insufficient documentation

## 2020-08-04 DIAGNOSIS — Z7984 Long term (current) use of oral hypoglycemic drugs: Secondary | ICD-10-CM | POA: Diagnosis not present

## 2020-08-04 LAB — CBC
HCT: 38.3 % (ref 36.0–46.0)
Hemoglobin: 12.7 g/dL (ref 12.0–15.0)
MCH: 28.6 pg (ref 26.0–34.0)
MCHC: 33.2 g/dL (ref 30.0–36.0)
MCV: 86.3 fL (ref 80.0–100.0)
Platelets: 334 10*3/uL (ref 150–400)
RBC: 4.44 MIL/uL (ref 3.87–5.11)
RDW: 12.2 % (ref 11.5–15.5)
WBC: 10.7 10*3/uL — ABNORMAL HIGH (ref 4.0–10.5)
nRBC: 0 % (ref 0.0–0.2)

## 2020-08-04 LAB — BASIC METABOLIC PANEL
Anion gap: 9 (ref 5–15)
BUN: 10 mg/dL (ref 6–20)
CO2: 23 mmol/L (ref 22–32)
Calcium: 8.5 mg/dL — ABNORMAL LOW (ref 8.9–10.3)
Chloride: 104 mmol/L (ref 98–111)
Creatinine, Ser: 0.59 mg/dL (ref 0.44–1.00)
GFR, Estimated: >60 mL/min (ref >60)
Glucose, Bld: 123 mg/dL — ABNORMAL HIGH (ref 70–99)
Potassium: 3.2 mmol/L — ABNORMAL LOW (ref 3.5–5.1)
Sodium: 136 mmol/L (ref 135–145)

## 2020-08-04 LAB — TROPONIN I (HIGH SENSITIVITY): Troponin I (High Sensitivity): 3 ng/L (ref <18)

## 2020-08-04 LAB — POC URINE PREG, ED: Preg Test, Ur: NEGATIVE

## 2020-08-04 NOTE — ED Triage Notes (Signed)
Pt c/o chest pain x about 5 mins while at [facility] that radiated from under left breath to shoulder to jaw with palpitations; pt reports family hx (Mother and Sister died in 72's from MI); pt further reports URI x 2 weeks

## 2020-08-04 NOTE — ED Notes (Signed)
Patient is being discharged from the Urgent Care and sent to the Emergency Department via pov . Per [place], [place], patient is in need of higher level of care due to cp. Patient is aware and verbalizes understanding of plan of care.  Vitals:   08/04/20 1522  BP: 131/88  Pulse: (!) 105  Resp: 18  Temp: 98.1 F (36.7 C)  SpO2: 96%
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
## 2020-08-04 NOTE — ED Triage Notes (Signed)
Cough and fever on and off for the past few days.  Also reports palpitations that cause chest pain immediately after.  Not having any cp at the current moment.
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
## 2020-08-04 NOTE — Discharge Instructions (Signed)
Coricidin is blood pressure safe to help with your upper respiratory infection Your testing shows no problems with your heart today ER for worsening symptoms You may follow-up with your family doctor if you should develop intermittent symptoms but come back to the emergency department for worsening symptoms

## 2020-08-05 ENCOUNTER — Telehealth: Payer: Self-pay

## 2020-08-05 NOTE — Telephone Encounter (Signed)
Transition Care Management Unsuccessful Follow-up Telephone Call  Date of discharge and from where:  08/04/2020 from [facility]  Attempts:  1st Attempt  Reason for unsuccessful TCM follow-up call:  Left voice message

## 2020-08-08 DIAGNOSIS — R079 Chest pain, unspecified: Secondary | ICD-10-CM | POA: Diagnosis not present

## 2020-08-08 DIAGNOSIS — R0602 Shortness of breath: Secondary | ICD-10-CM | POA: Diagnosis not present

## 2020-08-08 DIAGNOSIS — J069 Acute upper respiratory infection, unspecified: Secondary | ICD-10-CM | POA: Diagnosis not present

## 2020-08-08 DIAGNOSIS — I10 Essential (primary) hypertension: Secondary | ICD-10-CM | POA: Diagnosis not present

## 2020-08-08 DIAGNOSIS — R112 Nausea with vomiting, unspecified: Secondary | ICD-10-CM | POA: Diagnosis not present

## 2020-08-08 DIAGNOSIS — Z28311 Partially vaccinated for covid-19: Secondary | ICD-10-CM | POA: Diagnosis not present

## 2020-08-08 DIAGNOSIS — J029 Acute pharyngitis, unspecified: Secondary | ICD-10-CM | POA: Diagnosis not present

## 2020-08-08 DIAGNOSIS — R7401 Elevation of levels of liver transaminase levels: Secondary | ICD-10-CM | POA: Diagnosis not present

## 2020-08-08 DIAGNOSIS — R059 Cough, unspecified: Secondary | ICD-10-CM | POA: Diagnosis not present

## 2020-08-08 DIAGNOSIS — J3489 Other specified disorders of nose and nasal sinuses: Secondary | ICD-10-CM | POA: Diagnosis not present

## 2020-08-08 DIAGNOSIS — R0781 Pleurodynia: Secondary | ICD-10-CM | POA: Diagnosis not present

## 2020-08-08 DIAGNOSIS — R000 Tachycardia, unspecified: Secondary | ICD-10-CM | POA: Diagnosis not present

## 2020-08-08 DIAGNOSIS — R197 Diarrhea, unspecified: Secondary | ICD-10-CM | POA: Diagnosis not present

## 2020-08-08 DIAGNOSIS — R430 Anosmia: Secondary | ICD-10-CM | POA: Diagnosis not present

## 2020-08-08 DIAGNOSIS — R509 Fever, unspecified: Secondary | ICD-10-CM | POA: Diagnosis not present

## 2020-08-08 NOTE — Telephone Encounter (Signed)
Transition Care Management Unsuccessful Follow-up Telephone Call  Date of discharge and from where:  05/26/2022from [facility]  Attempts:  2nd Attempt  Reason for unsuccessful TCM follow-up call:  Left voice message

## 2020-08-09 NOTE — Telephone Encounter (Signed)
Transition Care Management Unsuccessful Follow-up Telephone Call  Date of discharge and from where:  08/04/2020 from [facility]  Attempts:  3rd Attempt  Reason for unsuccessful TCM follow-up call:  Unable to reach patient

## 2020-08-10 DIAGNOSIS — Z419 Encounter for procedure for purposes other than remedying health state, unspecified: Secondary | ICD-10-CM | POA: Diagnosis not present

## 2020-09-03 ENCOUNTER — Emergency Department (HOSPITAL_COMMUNITY)
Admission: EM | Admit: 2020-09-03 | Discharge: 2020-09-03 | Disposition: A | Discharge status: Home / Self Care | Payer: Medicaid Other | Attending: Emergency Medicine | Admitting: Emergency Medicine

## 2020-09-03 ENCOUNTER — Emergency Department (HOSPITAL_COMMUNITY): Payer: Medicaid Other

## 2020-09-03 ENCOUNTER — Other Ambulatory Visit: Payer: Self-pay

## 2020-09-03 ENCOUNTER — Encounter (HOSPITAL_COMMUNITY): Payer: Self-pay | Admitting: Emergency Medicine

## 2020-09-03 DIAGNOSIS — K8020 Calculus of gallbladder without cholecystitis without obstruction: Secondary | ICD-10-CM | POA: Diagnosis not present

## 2020-09-03 DIAGNOSIS — R000 Tachycardia, unspecified: Secondary | ICD-10-CM | POA: Diagnosis not present

## 2020-09-03 DIAGNOSIS — Z79899 Other long term (current) drug therapy: Secondary | ICD-10-CM | POA: Diagnosis not present

## 2020-09-03 DIAGNOSIS — J45909 Unspecified asthma, uncomplicated: Secondary | ICD-10-CM | POA: Insufficient documentation

## 2020-09-03 DIAGNOSIS — R109 Unspecified abdominal pain: Secondary | ICD-10-CM | POA: Diagnosis not present

## 2020-09-03 DIAGNOSIS — R197 Diarrhea, unspecified: Secondary | ICD-10-CM | POA: Diagnosis not present

## 2020-09-03 DIAGNOSIS — E119 Type 2 diabetes mellitus without complications: Secondary | ICD-10-CM | POA: Insufficient documentation

## 2020-09-03 DIAGNOSIS — R1013 Epigastric pain: Secondary | ICD-10-CM | POA: Diagnosis not present

## 2020-09-03 DIAGNOSIS — I10 Essential (primary) hypertension: Secondary | ICD-10-CM | POA: Diagnosis not present

## 2020-09-03 DIAGNOSIS — R9431 Abnormal electrocardiogram [ECG] [EKG]: Secondary | ICD-10-CM | POA: Diagnosis not present

## 2020-09-03 DIAGNOSIS — Z7984 Long term (current) use of oral hypoglycemic drugs: Secondary | ICD-10-CM | POA: Insufficient documentation

## 2020-09-03 DIAGNOSIS — R1012 Left upper quadrant pain: Secondary | ICD-10-CM | POA: Diagnosis present

## 2020-09-03 DIAGNOSIS — K760 Fatty (change of) liver, not elsewhere classified: Secondary | ICD-10-CM | POA: Diagnosis not present

## 2020-09-03 LAB — COMPREHENSIVE METABOLIC PANEL
ALT: 65 U/L — ABNORMAL HIGH (ref 0–44)
AST: 55 U/L — ABNORMAL HIGH (ref 15–41)
Albumin: 3.9 g/dL (ref 3.5–5.0)
Alkaline Phosphatase: 37 U/L — ABNORMAL LOW (ref 38–126)
Anion gap: 10 (ref 5–15)
BUN: 13 mg/dL (ref 6–20)
CO2: 24 mmol/L (ref 22–32)
Calcium: 8.9 mg/dL (ref 8.9–10.3)
Chloride: 103 mmol/L (ref 98–111)
Creatinine, Ser: 0.6 mg/dL (ref 0.44–1.00)
GFR, Estimated: >60 mL/min (ref >60)
Glucose, Bld: 194 mg/dL — ABNORMAL HIGH (ref 70–99)
Potassium: 3.5 mmol/L (ref 3.5–5.1)
Sodium: 137 mmol/L (ref 135–145)
Total Bilirubin: 0.3 mg/dL (ref 0.3–1.2)
Total Protein: 7 g/dL (ref 6.5–8.1)

## 2020-09-03 LAB — CBC
HCT: 38.7 % (ref 36.0–46.0)
Hemoglobin: 12.6 g/dL (ref 12.0–15.0)
MCH: 28.4 pg (ref 26.0–34.0)
MCHC: 32.6 g/dL (ref 30.0–36.0)
MCV: 87.2 fL (ref 80.0–100.0)
Platelets: 354 10*3/uL (ref 150–400)
RBC: 4.44 MIL/uL (ref 3.87–5.11)
RDW: 12.3 % (ref 11.5–15.5)
WBC: 10.8 10*3/uL — ABNORMAL HIGH (ref 4.0–10.5)
nRBC: 0 % (ref 0.0–0.2)

## 2020-09-03 LAB — TROPONIN I (HIGH SENSITIVITY)
Troponin I (High Sensitivity): 3 ng/L (ref <18)
Troponin I (High Sensitivity): 3 ng/L (ref <18)

## 2020-09-03 LAB — LIPASE, BLOOD: Lipase: 73 U/L — ABNORMAL HIGH (ref 11–51)

## 2020-09-03 NOTE — ED Provider Notes (Signed)
Emergency Medicine Provider Triage Evaluation Note  Monique Forbes , a 30 y.o. female  was evaluated in triage.  Pt complains of diffuse abdominal pain.  Review of Systems  Positive: Generalized abdominal pain Negative: No fever  Physical Exam  BP (!) 153/105 (BP Location: Left Arm)   Pulse (!) 104   Temp 98.3 F (36.8 C) (Oral)   Resp 16   Ht 1.651 m (5\' 5" )   Wt 102.1 kg   LMP 05/20/2020 (Exact Date) Comment: PCOS  SpO2 100%   BMI 37.44 kg/m  Gen:   Awake, no distress   Resp:  Normal effort  MSK:   Moves extremities without difficulty  Other:  Patient points to her abdomen as source of pain  Medical Decision Making  Medically screening exam initiated at 6:20 AM.  Appropriate orders placed.  Monique Forbes was informed that the remainder of the evaluation will be completed by another provider, this initial triage assessment does not replace that evaluation, and the importance of remaining in the ED until their evaluation is complete.  Will obtain ultrasound imaging.  Labs pending.  Patient declines pain medicine   07/20/2020, MD 09/03/20 [phone]
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
## 2020-09-03 NOTE — ED Triage Notes (Addendum)
Pt with abdominal pain since 9pm. States initially started as indigestion and radiated to back. Pt states both her mother and sister died from MI in their 57's.

## 2020-09-03 NOTE — ED Notes (Signed)
Informed pt about needing urine sample.

## 2020-09-05 ENCOUNTER — Telehealth: Payer: Self-pay | Admitting: *Deleted

## 2020-09-05 NOTE — Telephone Encounter (Signed)
Transition Care Management Unsuccessful Follow-up Telephone Call  Date of discharge and from where:  09/03/2020 - [facility]  Attempts:  1st Attempt  Reason for unsuccessful TCM follow-up call:  Left voice message

## 2020-09-06 NOTE — Telephone Encounter (Signed)
Transition Care Management Follow-up Telephone Call Date of discharge and from where: 09/03/2020 - [facility] How have you been since you were released from the hospital? "Still in some pain but it's a little better" Any questions or concerns? No  Items Reviewed: Did the pt receive and understand the discharge instructions provided? Yes  Medications obtained and verified? Yes  Other? No  Any new allergies since your discharge? No  Dietary orders reviewed? No Do you have support at home? Yes    Functional Questionnaire: (I = Independent and D = Dependent) ADLs: I  Bathing/Dressing- I  Meal Prep- I  Eating- I  Maintaining continence- I  Transferring/Ambulation- I  Managing Meds- I  Follow up appointments reviewed:  PCP Hospital f/u appt confirmed? No   Specialist Hospital f/u appt confirmed? No   Are transportation arrangements needed? No  If their condition worsens, is the pt aware to call PCP or go to the Emergency Dept.? Yes Was the patient provided with contact information for the PCP's office or ED? Yes Was to pt encouraged to call back with questions or concerns? Yes

## 2020-09-09 DIAGNOSIS — Z419 Encounter for procedure for purposes other than remedying health state, unspecified: Secondary | ICD-10-CM | POA: Diagnosis not present

## 2020-09-24 ENCOUNTER — Encounter: Payer: Self-pay | Admitting: Emergency Medicine

## 2020-09-24 ENCOUNTER — Other Ambulatory Visit: Payer: Self-pay

## 2020-09-24 ENCOUNTER — Ambulatory Visit
Admission: EM | Admit: 2020-09-24 | Discharge: 2020-09-24 | Disposition: A | Discharge status: Home / Self Care | Payer: Medicaid Other | Attending: Emergency Medicine | Admitting: Emergency Medicine

## 2020-09-24 DIAGNOSIS — R52 Pain, unspecified: Secondary | ICD-10-CM | POA: Diagnosis not present

## 2020-09-24 DIAGNOSIS — R6889 Other general symptoms and signs: Secondary | ICD-10-CM

## 2020-09-24 MED ORDER — ONDANSETRON HCL 4 MG PO TABS: 4.0000 mg | ORAL_TABLET | Freq: Four times a day (QID) | ORAL | 0 refills | Status: DC

## 2020-09-24 NOTE — Discharge Instructions (Addendum)
COVID testing ordered.  It will take between 5-7 days for test results.  Someone will contact you regarding abnormal results.    In the meantime: You should remain isolated in your home for 5 days from symptom onset AND greater than 72 hours after symptoms resolution (absence of fever without the use of fever-reducing medication and improvement in respiratory symptoms), whichever is longer Get plenty of rest and push fluids Zofran prescribed for cough Use OTC zyrtec for nasal congestion, runny nose, and/or sore throat Use OTC flonase for nasal congestion and runny nose Use medications daily for symptom relief Use OTC medications like ibuprofen or tylenol as needed fever or pain Call or go to the ED if you have any new or worsening symptoms such as fever, cough, shortness of breath, chest tightness, chest pain, turning blue, changes in mental status, etc..Marland Kitchen
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
## 2020-09-24 NOTE — ED Triage Notes (Signed)
Body aches, headache, fever, n/v/d, sore throat since yesterday

## 2020-09-27 ENCOUNTER — Telehealth: Payer: Self-pay

## 2020-09-27 ENCOUNTER — Telehealth: Payer: Medicaid Other | Admitting: Internal Medicine

## 2020-09-27 ENCOUNTER — Encounter: Payer: Self-pay | Admitting: Internal Medicine

## 2020-09-27 VITALS — Temp 102.0°F | Resp 16 | Ht 65.0 in | Wt 224.0 lb

## 2020-09-27 DIAGNOSIS — U071 COVID-19: Secondary | ICD-10-CM

## 2020-09-27 DIAGNOSIS — J208 Acute bronchitis due to other specified organisms: Secondary | ICD-10-CM | POA: Diagnosis not present

## 2020-09-27 LAB — COVID-19, FLU A+B NAA
Influenza A, NAA: NOT DETECTED
Influenza B, NAA: NOT DETECTED
SARS-CoV-2, NAA: DETECTED — AB

## 2020-09-27 MED ORDER — NIRMATRELVIR/RITONAVIR (PAXLOVID)TABLET: 3.0000 | ORAL_TABLET | Freq: Two times a day (BID) | ORAL | 0 refills | Status: AC

## 2020-09-27 NOTE — Telephone Encounter (Signed)
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 Toni Amend

## 2020-09-27 NOTE — Telephone Encounter (Signed)
Pt called and advised she was seen on 09/24/20 at Urgent care and they dx her with Covid and put her out of work until today and was asking if we could extend her note for work.  I advised pt to contact the urgent care back and see if they can extend her work note since they seen her and also see if they will give her any medication for her symptoms.  Advised pt if they can't do those things for her that she would need to call us back and schedule a virtual visit with [place].
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
## 2020-09-29 ENCOUNTER — Encounter (HOSPITAL_COMMUNITY): Payer: Self-pay

## 2020-09-29 ENCOUNTER — Other Ambulatory Visit: Payer: Self-pay

## 2020-09-29 ENCOUNTER — Emergency Department (HOSPITAL_COMMUNITY)
Admission: EM | Admit: 2020-09-29 | Discharge: 2020-09-30 | Disposition: A | Discharge status: Home / Self Care | Payer: Medicaid Other | Attending: Emergency Medicine | Admitting: Emergency Medicine

## 2020-09-29 DIAGNOSIS — N200 Calculus of kidney: Secondary | ICD-10-CM | POA: Diagnosis not present

## 2020-09-29 DIAGNOSIS — N201 Calculus of ureter: Secondary | ICD-10-CM | POA: Diagnosis not present

## 2020-09-29 DIAGNOSIS — J45909 Unspecified asthma, uncomplicated: Secondary | ICD-10-CM | POA: Diagnosis not present

## 2020-09-29 DIAGNOSIS — N1330 Unspecified hydronephrosis: Secondary | ICD-10-CM | POA: Diagnosis not present

## 2020-09-29 DIAGNOSIS — E119 Type 2 diabetes mellitus without complications: Secondary | ICD-10-CM | POA: Diagnosis not present

## 2020-09-29 DIAGNOSIS — R109 Unspecified abdominal pain: Secondary | ICD-10-CM | POA: Diagnosis present

## 2020-09-29 DIAGNOSIS — I10 Essential (primary) hypertension: Secondary | ICD-10-CM | POA: Diagnosis not present

## 2020-09-29 DIAGNOSIS — K760 Fatty (change of) liver, not elsewhere classified: Secondary | ICD-10-CM | POA: Diagnosis not present

## 2020-09-29 DIAGNOSIS — Z8701 Personal history of pneumonia (recurrent): Secondary | ICD-10-CM | POA: Diagnosis not present

## 2020-09-29 NOTE — ED Triage Notes (Signed)
Pt states she was diagnosed with covid 2 days ago. However today she began with left sided abdominal pain and difficulty urinating.

## 2020-09-30 ENCOUNTER — Emergency Department (HOSPITAL_COMMUNITY): Payer: Medicaid Other

## 2020-09-30 DIAGNOSIS — N1330 Unspecified hydronephrosis: Secondary | ICD-10-CM | POA: Diagnosis not present

## 2020-09-30 DIAGNOSIS — N201 Calculus of ureter: Secondary | ICD-10-CM | POA: Diagnosis not present

## 2020-09-30 DIAGNOSIS — K760 Fatty (change of) liver, not elsewhere classified: Secondary | ICD-10-CM | POA: Diagnosis not present

## 2020-09-30 DIAGNOSIS — N200 Calculus of kidney: Secondary | ICD-10-CM | POA: Diagnosis not present

## 2020-09-30 DIAGNOSIS — Z8701 Personal history of pneumonia (recurrent): Secondary | ICD-10-CM | POA: Diagnosis not present

## 2020-09-30 LAB — CBC
HCT: 38.6 % (ref 36.0–46.0)
Hemoglobin: 12.7 g/dL (ref 12.0–15.0)
MCH: 28.2 pg (ref 26.0–34.0)
MCHC: 32.9 g/dL (ref 30.0–36.0)
MCV: 85.6 fL (ref 80.0–100.0)
Platelets: 309 10*3/uL (ref 150–400)
RBC: 4.51 MIL/uL (ref 3.87–5.11)
RDW: 11.9 % (ref 11.5–15.5)
WBC: 9.1 10*3/uL (ref 4.0–10.5)
nRBC: 0 % (ref 0.0–0.2)

## 2020-09-30 LAB — URINALYSIS, ROUTINE W REFLEX MICROSCOPIC
Bilirubin Urine: NEGATIVE
Glucose, UA: NEGATIVE mg/dL
Ketones, ur: NEGATIVE mg/dL
Leukocytes,Ua: NEGATIVE
Nitrite: NEGATIVE
Protein, ur: 30 mg/dL — AB
RBC / HPF: >50 RBC/hpf — ABNORMAL HIGH (ref 0–5)
Specific Gravity, Urine: 1.019 (ref 1.005–1.030)
pH: 5 (ref 5.0–8.0)

## 2020-09-30 LAB — COMPREHENSIVE METABOLIC PANEL
ALT: 61 U/L — ABNORMAL HIGH (ref 0–44)
AST: 39 U/L (ref 15–41)
Albumin: 3.7 g/dL (ref 3.5–5.0)
Alkaline Phosphatase: 35 U/L — ABNORMAL LOW (ref 38–126)
Anion gap: 8 (ref 5–15)
BUN: 11 mg/dL (ref 6–20)
CO2: 27 mmol/L (ref 22–32)
Calcium: 8.5 mg/dL — ABNORMAL LOW (ref 8.9–10.3)
Chloride: 103 mmol/L (ref 98–111)
Creatinine, Ser: 0.93 mg/dL (ref 0.44–1.00)
GFR, Estimated: >60 mL/min (ref >60)
Glucose, Bld: 143 mg/dL — ABNORMAL HIGH (ref 70–99)
Potassium: 3.8 mmol/L (ref 3.5–5.1)
Sodium: 138 mmol/L (ref 135–145)
Total Bilirubin: 0.6 mg/dL (ref 0.3–1.2)
Total Protein: 7.3 g/dL (ref 6.5–8.1)

## 2020-09-30 LAB — PREGNANCY, URINE: Preg Test, Ur: NEGATIVE

## 2020-09-30 MED ORDER — TAMSULOSIN HCL 0.4 MG PO CAPS: 0.4000 mg | ORAL_CAPSULE | Freq: Every day | ORAL | 0 refills | Status: DC

## 2020-09-30 MED ORDER — OXYCODONE HCL 5 MG PO TABS: 5.0000 mg | ORAL_TABLET | ORAL | 0 refills | Status: DC | PRN
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
## 2020-09-30 NOTE — Discharge Instructions (Addendum)
You were evaluated in the Emergency Department and after careful evaluation, we did not find any emergent condition requiring admission or further testing in the hospital.  Your exam/testing today was overall reassuring.  Symptoms seem to be due to a kidney stone.  Recommend Tylenol 1000 mg every 4-6 hours and/or Motrin 600 mg every 4-6 hours for pain.  You can use the oxycodone medication for more significant pain.  Use the Flomax daily as directed to help you pass the stone.  Recommend follow-up with the urologist for further management.  You should pass the stone on your own.  Please return to the Emergency Department if you experience any worsening of your condition.  Thank you for allowing [place] to be a part of your care.

## 2020-09-30 NOTE — ED Notes (Signed)
Pt returned from CT °

## 2020-10-03 ENCOUNTER — Telehealth: Payer: Self-pay

## 2020-10-03 NOTE — Telephone Encounter (Signed)
Transition Care Management Unsuccessful Follow-up Telephone Call  Date of discharge and from where:  09/30/2020 from [facility]  Attempts:  1st Attempt  Reason for unsuccessful TCM follow-up call:  Left voice message

## 2020-10-04 NOTE — Telephone Encounter (Signed)
Transition Care Management Unsuccessful Follow-up Telephone Call  Date of discharge and from where:  09/30/2020-[facility]   Attempts:  2nd Attempt  Reason for unsuccessful TCM follow-up call:  Left voice message

## 2020-10-05 NOTE — Telephone Encounter (Signed)
Transition Care Management Unsuccessful Follow-up Telephone Call  Date of discharge and from where:  09/30/2020-   Attempts:  3rd Attempt  Reason for unsuccessful TCM follow-up call:  Left voice message

## 2020-10-10 DIAGNOSIS — Z419 Encounter for procedure for purposes other than remedying health state, unspecified: Secondary | ICD-10-CM | POA: Diagnosis not present

## 2020-10-18 ENCOUNTER — Telehealth: Payer: Self-pay

## 2020-10-18 ENCOUNTER — Other Ambulatory Visit: Payer: Self-pay

## 2020-10-18 DIAGNOSIS — E282 Polycystic ovarian syndrome: Secondary | ICD-10-CM

## 2020-10-18 MED ORDER — SPIRONOLACTONE 25 MG PO TABS: 25.0000 mg | ORAL_TABLET | Freq: Every day | ORAL | 0 refills | Status: DC

## 2020-10-18 MED ORDER — AMLODIPINE BESYLATE 5 MG PO TABS: 5.0000 mg | ORAL_TABLET | Freq: Every day | ORAL | 0 refills | Status: DC

## 2020-10-18 NOTE — Telephone Encounter (Signed)
[name] to call us back spirolactone what dose and what is direction

## 2020-10-21 ENCOUNTER — Ambulatory Visit: Payer: Medicaid Other | Admitting: Nurse Practitioner

## 2020-10-24 ENCOUNTER — Encounter: Payer: Self-pay | Admitting: Nurse Practitioner

## 2020-10-24 ENCOUNTER — Ambulatory Visit: Payer: Medicaid Other | Admitting: Nurse Practitioner

## 2020-10-24 ENCOUNTER — Other Ambulatory Visit: Payer: Self-pay

## 2020-10-24 VITALS — BP 144/90 | HR 99 | Temp 97.5°F | Resp 16 | Ht 65.0 in | Wt 225.0 lb

## 2020-10-24 DIAGNOSIS — Z6837 Body mass index (BMI) 37.0-37.9, adult: Secondary | ICD-10-CM | POA: Diagnosis not present

## 2020-10-24 DIAGNOSIS — E1165 Type 2 diabetes mellitus with hyperglycemia: Secondary | ICD-10-CM | POA: Diagnosis not present

## 2020-10-24 DIAGNOSIS — E282 Polycystic ovarian syndrome: Secondary | ICD-10-CM

## 2020-10-24 DIAGNOSIS — I10 Essential (primary) hypertension: Secondary | ICD-10-CM

## 2020-10-24 LAB — POCT GLYCOSYLATED HEMOGLOBIN (HGB A1C): Hemoglobin A1C: 7 % — AB (ref 4.0–5.6)

## 2020-10-24 LAB — POCT UA - MICROALBUMIN
Creatinine, POC: 200 mg/dL
Microalbumin Ur, POC: 150 mg/L

## 2020-10-24 MED ORDER — AMLODIPINE BESYLATE 10 MG PO TABS: 10.0000 mg | ORAL_TABLET | Freq: Every day | ORAL | 0 refills | Status: DC

## 2020-10-24 MED ORDER — SEMAGLUTIDE(0.25 OR 0.5MG/DOS) 2 MG/1.5ML ~~LOC~~ SOPN: 0.5000 mg | PEN_INJECTOR | SUBCUTANEOUS | 0 refills | Status: DC
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
## 2020-10-30 MED ORDER — DULAGLUTIDE 1.5 MG/0.5ML ~~LOC~~ SOAJ: 1.5000 mg | SUBCUTANEOUS | 1 refills | Status: DC

## 2020-11-02 ENCOUNTER — Telehealth: Payer: Self-pay

## 2020-11-02 NOTE — Telephone Encounter (Signed)
Send PA through cover my meds waiting for respond  

## 2020-11-02 NOTE — Telephone Encounter (Signed)
Trulicity approved from 10/19/2020 to 11/02/2021

## 2020-11-03 ENCOUNTER — Encounter: Payer: Self-pay | Admitting: Nurse Practitioner

## 2020-11-03 ENCOUNTER — Ambulatory Visit: Payer: Medicaid Other | Admitting: Nurse Practitioner

## 2020-11-03 ENCOUNTER — Other Ambulatory Visit: Payer: Self-pay

## 2020-11-03 ENCOUNTER — Ambulatory Visit
Admission: EM | Admit: 2020-11-03 | Discharge: 2020-11-03 | Disposition: A | Discharge status: Home / Self Care | Payer: Medicaid Other

## 2020-11-03 VITALS — BP 140/84 | HR 88 | Temp 97.7°F | Resp 16 | Ht 65.0 in | Wt 221.0 lb

## 2020-11-03 DIAGNOSIS — E1165 Type 2 diabetes mellitus with hyperglycemia: Secondary | ICD-10-CM | POA: Diagnosis not present

## 2020-11-03 DIAGNOSIS — I10 Essential (primary) hypertension: Secondary | ICD-10-CM

## 2020-11-03 DIAGNOSIS — E282 Polycystic ovarian syndrome: Secondary | ICD-10-CM
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
## 2020-11-07 NOTE — Telephone Encounter (Signed)
Pt made appt and was seen

## 2020-11-10 DIAGNOSIS — Z419 Encounter for procedure for purposes other than remedying health state, unspecified: Secondary | ICD-10-CM | POA: Diagnosis not present

## 2020-11-15 ENCOUNTER — Other Ambulatory Visit: Payer: Self-pay

## 2020-11-15 ENCOUNTER — Telehealth: Payer: Self-pay

## 2020-11-15 DIAGNOSIS — E282 Polycystic ovarian syndrome: Secondary | ICD-10-CM

## 2020-11-15 DIAGNOSIS — I10 Essential (primary) hypertension: Secondary | ICD-10-CM

## 2020-11-15 MED ORDER — METFORMIN HCL ER 500 MG PO TB24: 1000.0000 mg | ORAL_TABLET | Freq: Every day | ORAL | 1 refills | Status: DC

## 2020-11-15 MED ORDER — AMLODIPINE BESYLATE 5 MG PO TABS: 5.0000 mg | ORAL_TABLET | Freq: Every day | ORAL | 0 refills | Status: DC

## 2020-11-15 MED ORDER — SPIRONOLACTONE 25 MG PO TABS
25.0000 mg | ORAL_TABLET | Freq: Every day | ORAL | 3 refills | Status: DC
Start: 2020-11-15 — End: 2021-03-17

## 2020-11-15 NOTE — Telephone Encounter (Signed)
Pt advised we send amlodipine 5mg  make her sick tired and fatigue

## 2020-11-22 ENCOUNTER — Ambulatory Visit: Payer: Medicaid Other | Admitting: Nurse Practitioner

## 2020-11-22 ENCOUNTER — Encounter: Payer: Self-pay | Admitting: Nurse Practitioner

## 2020-11-22 ENCOUNTER — Other Ambulatory Visit: Payer: Self-pay

## 2020-11-22 VITALS — BP 130/82 | HR 85 | Temp 98.4°F | Resp 16 | Ht 65.0 in | Wt 216.0 lb

## 2020-11-22 DIAGNOSIS — Z23 Encounter for immunization: Secondary | ICD-10-CM | POA: Diagnosis not present

## 2020-11-22 DIAGNOSIS — Z6835 Body mass index (BMI) 35.0-35.9, adult: Secondary | ICD-10-CM

## 2020-11-22 DIAGNOSIS — E119 Type 2 diabetes mellitus without complications: Secondary | ICD-10-CM | POA: Insufficient documentation

## 2020-11-22 DIAGNOSIS — I10 Essential (primary) hypertension: Secondary | ICD-10-CM | POA: Diagnosis not present

## 2020-11-22 DIAGNOSIS — E282 Polycystic ovarian syndrome: Secondary | ICD-10-CM | POA: Diagnosis not present

## 2020-11-22 DIAGNOSIS — E1165 Type 2 diabetes mellitus with hyperglycemia: Secondary | ICD-10-CM | POA: Diagnosis not present

## 2020-11-22 MED ORDER — AMLODIPINE BESYLATE 5 MG PO TABS: 5.0000 mg | ORAL_TABLET | Freq: Every day | ORAL | 0 refills | Status: DC
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
## 2020-12-04 DIAGNOSIS — R1110 Vomiting, unspecified: Secondary | ICD-10-CM | POA: Diagnosis not present

## 2020-12-04 DIAGNOSIS — R509 Fever, unspecified: Secondary | ICD-10-CM | POA: Diagnosis not present

## 2020-12-04 DIAGNOSIS — E1165 Type 2 diabetes mellitus with hyperglycemia: Secondary | ICD-10-CM | POA: Diagnosis not present

## 2020-12-04 DIAGNOSIS — R739 Hyperglycemia, unspecified: Secondary | ICD-10-CM | POA: Diagnosis not present

## 2020-12-06 ENCOUNTER — Telehealth: Payer: Self-pay

## 2020-12-06 NOTE — Telephone Encounter (Signed)
Transition Care Management Unsuccessful Follow-up Telephone Call  Date of discharge and from where:  12/05/2020-[facility] Monique Forbes   Attempts:  1st Attempt  Reason for unsuccessful TCM follow-up call:  Left voice message

## 2020-12-10 DIAGNOSIS — Z419 Encounter for procedure for purposes other than remedying health state, unspecified: Secondary | ICD-10-CM | POA: Diagnosis not present

## 2020-12-12 NOTE — Telephone Encounter (Signed)
Transition Care Management Unsuccessful Follow-up Telephone Call  Date of discharge and from where:  12/05/2020 from [facility]  Attempts:  2nd Attempt  Reason for unsuccessful TCM follow-up call:  Left voice message

## 2020-12-13 NOTE — Telephone Encounter (Signed)
Transition Care Management Unsuccessful Follow-up Telephone Call  Date of discharge and from where:  12/05/2020 from [facility]  Attempts:  3rd Attempt  Reason for unsuccessful TCM follow-up call:  Unable to reach patient

## 2020-12-14 ENCOUNTER — Other Ambulatory Visit: Payer: Self-pay | Admitting: Obstetrics and Gynecology

## 2020-12-14 NOTE — Patient Instructions (Signed)
Hi Ms. Lacina-sorry we missed you today - as a part of your Medicaid benefit, you are eligible for care management and care coordination services at no cost or copay. I was unable to reach you by phone today but would be happy to help you with your health related needs. Please feel free to call me at [phone].  A member of the Managed Medicaid care management team will reach out to you again over the next 7 days.   Kathi Der RN, BSN [place]  Triad Engineer, production - Managed Medicaid High Risk [phone].

## 2020-12-14 NOTE — Patient Outreach (Signed)
Care Coordination  12/14/2020  Monique Forbes 1990-08-26 [id number]   Medicaid Managed Care   Unsuccessful Outreach Note  12/14/2020 Name: Monique Forbes MRN: [id number] DOB: [birth year]  Referred by: Sallyanne Kuster, NP Reason for referral : High Risk Managed Medicaid (Unsuccessful telephone outreach)   An unsuccessful telephone outreach was attempted today. The patient was referred to the case management team for assistance with care management and care coordination.   Follow Up Plan: The care management team will reach out to the patient again over the next 7 days.   Kathi Der RN, BSN [place]  Triad Engineer, production - Managed Medicaid High Risk [phone].

## 2021-01-02 ENCOUNTER — Other Ambulatory Visit: Payer: Self-pay | Admitting: Obstetrics and Gynecology

## 2021-01-02 ENCOUNTER — Other Ambulatory Visit: Payer: Self-pay

## 2021-01-02 NOTE — Patient Instructions (Signed)
Hi Ms. Schachter, thank you for speaking with me today-please remember to call your PCP about your blood pressure and blood sugar.  Ms. Schlag was given information about Medicaid Managed Care team care coordination services as a part of their [facility] Medicaid benefit. Farha Ekstrom verbally consented to engagement with the [facility] Managed Care team.   If you are experiencing a medical emergency, please call 911 or report to your local emergency department or urgent care.   If you have a non-emergency medical problem during routine business hours, please contact your provider's office and ask to speak with a nurse.   For questions related to your [facility] health plan, please call: [phone] or go here:[email or web address]  If you would like to schedule transportation through your [facility] plan, please call the following number at least 2 days in advance of your appointment: [phone].  Call the [place] at [phone], at any time, 24 hours a day, 7 days a week. If you are in danger or need immediate medical attention call 911.  If you would like help to quit smoking, call 1-800-QUIT-NOW 561-601-2347) OR Espaol: 1-855-Djelo-Ya (2-878-676-7209) o para ms informacin haga clic aqu or Text READY to 200-400 to register via text  The patient verbalized understanding of instructions provided today and declined a print copy of patient instruction materials.   The Managed Medicaid care management team will reach out to the patient again over the next 30 days.  The  Patient  has been provided with contact information for the Managed Medicaid care management team and has been advised to call with any health related questions or concerns.   Aida Raider RN, BSN [place]  Triad Curator - Managed Medicaid High Risk [phone].    Following is a copy of your plan of care:  Patient Care Plan: RN  Care Manager Plan of Care     Problem Identified: Health Promotion or Disease Self-Management (General Plan of Care)      Long-Range Goal: Chronic Disease Management and Care Coordination Needs   Start Date: 01/02/2021  Expected End Date: 04/04/2021  Priority: High  Note:   Current Barriers:  Knowledge Deficits related to plan of care for management of HTN, DMII, Anxiety, and Depression Care Coordination needs related to anxiety, depression, HTN, DM, financial resources.  Patient states she had a blood pressure of 156/92 today and blood glucose of 500 yesterday-h/o MI in family. Chronic Disease Management support and education needs related to HTN, DMII, Anxiety, and Depression Financial Constraints-needs rent and utility assistance  RNCM Clinical Goal(s):  Patient will verbalize understanding of plan for management of HTN, DMII, Anxiety, and Depression verbalize basic understanding of  HTN, DMII, Anxiety, and Depression disease process and self health management plan  take all medications exactly as prescribed and will call provider for medication related questions demonstrate understanding of rationale for each prescribed medication  attend all scheduled medical appointments:  continue to work with RN Care Manager to address care management and care coordination needs related to  HTN, DMII, Anxiety, and Depression work with pharmacist to address medicaitons work with Education officer, museum to address  related to the management of Financial constraints related to rent, utilities  and [place] Concerns  related to the management of Anxiety and Depression through collaboration with Consulting civil engineer, provider, and care team.   Interventions: Inter-disciplinary care team collaboration (see longitudinal plan of care) Evaluation of current treatment plan related to  self management and  patient's adherence to plan as established by provider Collaboration with SW for financial resources, anxiety and  depression Collaboration with PCP for dietary referral BSW referral for resources LCSW referral for anxiety, depression, PTSD   Diabetes Interventions: Assessed patient's understanding of A1c goal: <7% Reviewed medications with patient and discussed importance of medication adherence; Counseled on importance of regular laboratory monitoring as prescribed; Discussed plans with patient for ongoing care management follow up and provided patient with direct contact information for care management team; Reviewed scheduled/upcoming provider appointments  Advised patient, providing education and rationale, to check cbg and record, calling  for findings outside established parameters; Referral made to pharmacy team for assistance with medicaitons; Review of patient status, including review of consultants reports, relevant laboratory and other test results, and medications completed; Assessed social determinant of health barriers;  Lab Results  Component Value Date   HGBA1C 7.0 (A) 10/24/2020     Hypertension Interventions: Last practice recorded BP readings:  BP Readings from Last 3 Encounters:  11/22/20 130/82  11/03/20 140/84  10/24/20 (!) 144/90  Most recent eGFR/CrCl:  Lab Results  Component Value Date   EGFR 120 05/27/2020    No components found for: CRCL  Evaluation of current treatment plan related to hypertension self management and patient's adherence to plan as established by provider; Reviewed medications with patient and discussed importance of compliance; Counseled on the importance of exercise goals with target of 150 minutes per week Discussed plans with patient for ongoing care management follow up and provided patient with direct contact information for care management team; Advised patient, providing education and rationale, to monitor blood pressure daily and record, calling PCP for findings outside established parameters;  Reviewed scheduled/upcoming provider  appointments Advised patient to discuss blood pressure and blood glucose readings  with provider; Assessed social determinant of health barriers;   Patient Goals/Self-Care Activities: Patient will self administer medications as prescribed Patient will attend all scheduled provider appointments Patient will call pharmacy for medication refills Patient will continue to perform ADL's independently Patient will call provider office for new concerns or questions Patient will work with BSW to address care coordination needs and will continue to work with the clinical team to address health care and disease management related needs.    Follow Up Plan:  The care management team will reach out to the patient again over the next 30 days.  Evidence-based guidance:   Review biopsychosocial determinants of health screens.  Review need for preventive screening based on age, sex, family history and health history.  Determine level of modifiable health risk.  Assess level of child and caregiver activation, level of readiness, importance and confidence to make changes.  Discuss identified risks.  Identify areas where behavior change may lead to improved health.  Promote healthy lifestyle.  Evoke change talk using open-ended questions, pros and cons, as well as looking forward.  Identify and manage conditions or preconditions to reduce health risk.  Implement additional goals and interventions based on identified risk factors.  Include child's contributions based on age and understanding to self-management plan.  Support child and caregiver active participation in decision-making and self-management plan.

## 2021-01-02 NOTE — Patient Outreach (Signed)
Medicaid Managed Care   Nurse Care Manager Note  01/02/2021 Name:  Monique Forbes MRN:  [id number] DOB:  13-Feb-1991  Monique Forbes is an 30 y.o. year old female who is a primary patient of Monique Osgood, NP.  The [facility] Managed Care Coordination team was consulted for assistance with:    Chronic healthcare management needs  Ms. Brophy was given information about Medicaid Managed Care Coordination team services today. Monique Forbes Patient agreed to services and verbal consent obtained.  Engaged with patient by telephone for initial visit in response to provider referral for case management and/or care coordination services.   Assessments/Interventions:  Review of past medical history, allergies, medications, health status, including review of consultants reports, laboratory and other test data, was performed as part of comprehensive evaluation and provision of chronic care management services.  SDOH (Social Determinants of Health) assessments and interventions performed: SDOH Interventions    Flowsheet Row Most Recent Value  SDOH Interventions   Food Insecurity Interventions Intervention Not Indicated  Financial Strain Interventions Other (Comment)  [referral to BSW for resources]  Housing Interventions Intervention Not Indicated  Transportation Interventions Intervention Not Indicated       Care Plan  Allergies  Allergen Reactions   Bee Venom Anaphylaxis   Other Anaphylaxis    Medications Reviewed Today     Reviewed by Gayla Medicus, RN (Registered Nurse) on 01/02/21 at 1338  Med List Status: <None>   Medication Order Taking? Sig Documenting Provider Last Dose Status Informant  amLODipine (NORVASC) 5 MG tablet [id number] Yes Take 1 tablet (5 mg total) by mouth daily. Monique Osgood, NP Taking Active   Dulaglutide 1.5 MG/0.5ML SOPN [id number] Yes Inject 1.5 mg into the skin once a week. Monique Osgood, NP Taking Active    Patient not taking:   Discontinued  08/04/20 1851 metFORMIN (GLUCOPHAGE-XR) 500 MG 24 hr tablet [id number] Yes Take 2 tablets (1,000 mg total) by mouth daily with breakfast. Monique Osgood, NP Taking Active   omeprazole (PRILOSEC) 20 MG capsule [id number] No Take 1 capsule (20 mg total) by mouth daily.  Patient not taking: Reported on 01/02/2021   Monique Ochoa, NP Not Taking Active Self  Probiotic Product (PROBIOTIC PO) [id number] Yes Take 1 tablet by mouth daily. [provider] Taking Active Self  spironolactone (ALDACTONE) 25 MG tablet [id number] Yes Take 1 tablet (25 mg total) by mouth daily. Monique Osgood, NP Taking Active             Patient Active Problem List   Diagnosis Date Noted   Uncontrolled type 2 diabetes mellitus with hyperglycemia ([place]) 11/22/2020   Dizziness 06/06/2019   Personal history of COVID-19 2019/04/06   Essential hypertension 2019-04-06   Shortness of breath 04/06/2019   Family history of sudden cardiac death in mother 04/06/2019   Heart murmur 06-Apr-2019   Screening for hyperlipidemia Apr 06, 2019   Diabetes mellitus type 2, controlled, without complications ([place]) 84/66/5993   Neck swelling 08/12/2018   Pain, dental 08/12/2018   PCOS (polycystic ovarian syndrome) 08/12/2018   Sepsis ([place]) 08/12/2018   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   Anxiety and depression 08/23/2016   Severe recurrent major depression without psychotic features ([place]) 08/23/2016   Moderate episode of recurrent major depressive disorder ([place]) 08/21/2016   Insulin resistance 02/24/2016   Hypertriglyceridemia 02/24/2016   History of PCOS 07/23/2013    Conditions to be addressed/monitored per PCP order:  HTN, DMII, Anxiety, Depression  Care Plan : RN Care  Manager Plan of Care  Updates made by Gayla Medicus, RN since 01/02/2021 12:00 AM     Problem: Health Promotion or Disease Self-Management (General Plan of Care)      Long-Range Goal:  Chronic Disease Management and Care Coordination Needs   Start Date: 01/02/2021  Expected End Date: 04/04/2021  Priority: High  Note:   Current Barriers:  Knowledge Deficits related to plan of care for management of HTN, DMII, Anxiety, and Depression Care Coordination needs related to anxiety, depression, HTN, DM, financial resources.  Patient states she had a blood pressure of 156/92 today and blood glucose of 500 yesterday-h/o MI in family. Chronic Disease Management support and education needs related to HTN, DMII, Anxiety, and Depression Financial Constraints-needs rent and utility assistance  RNCM Clinical Goal(s):  Patient will verbalize understanding of plan for management of HTN, DMII, Anxiety, and Depression verbalize basic understanding of  HTN, DMII, Anxiety, and Depression disease process and self health management plan  take all medications exactly as prescribed and will call provider for medication related questions demonstrate understanding of rationale for each prescribed medication  attend all scheduled medical appointments:  continue to work with RN Care Manager to address care management and care coordination needs related to  HTN, DMII, Anxiety, and Depression work with pharmacist to address medicaitons work with Education officer, museum to address  related to the management of Financial constraints related to rent, utilities  and [place] Concerns  related to the management of Anxiety and Depression through collaboration with Consulting civil engineer, provider, and care team.   Interventions: Inter-disciplinary care team collaboration (see longitudinal plan of care) Evaluation of current treatment plan related to  self management and patient's adherence to plan as established by provider Collaboration with SW for financial resources, anxiety and depression Collaboration with PCP for dietary referral BSW referral for resources LCSW referral for anxiety, depression, PTSD   Diabetes  Interventions: Assessed patient's understanding of A1c goal: <7% Reviewed medications with patient and discussed importance of medication adherence; Counseled on importance of regular laboratory monitoring as prescribed; Discussed plans with patient for ongoing care management follow up and provided patient with direct contact information for care management team; Reviewed scheduled/upcoming provider appointments  Advised patient, providing education and rationale, to check cbg and record, calling  for findings outside established parameters; Referral made to pharmacy team for assistance with medicaitons; Review of patient status, including review of consultants reports, relevant laboratory and other test results, and medications completed; Assessed social determinant of health barriers;  Lab Results  Component Value Date   HGBA1C 7.0 (A) 10/24/2020     Hypertension Interventions: Last practice recorded BP readings:  BP Readings from Last 3 Encounters:  11/22/20 130/82  11/03/20 140/84  10/24/20 (!) 144/90  Most recent eGFR/CrCl:  Lab Results  Component Value Date   EGFR 120 05/27/2020    No components found for: CRCL  Evaluation of current treatment plan related to hypertension self management and patient's adherence to plan as established by provider; Reviewed medications with patient and discussed importance of compliance; Counseled on the importance of exercise goals with target of 150 minutes per week Discussed plans with patient for ongoing care management follow up and provided patient with direct contact information for care management team; Advised patient, providing education and rationale, to monitor blood pressure daily and record, calling PCP for findings outside established parameters;  Reviewed scheduled/upcoming provider appointments Advised patient to discuss blood pressure and blood glucose readings  with provider; Assessed  social determinant of health barriers;    Patient Goals/Self-Care Activities: Patient will self administer medications as prescribed Patient will attend all scheduled provider appointments Patient will call pharmacy for medication refills Patient will continue to perform ADL's independently Patient will call provider office for new concerns or questions Patient will work with BSW to address care coordination needs and will continue to work with the clinical team to address health care and disease management related needs.    Follow Up Plan:  The care management team will reach out to the patient again over the next 30 days. Evidence-based guidance:   Review biopsychosocial determinants of health screens.  Review need for preventive screening based on age, sex, family history and health history.  Determine level of modifiable health risk.  Assess level of child and caregiver activation, level of readiness, importance and confidence to make changes.  Discuss identified risks.  Identify areas where behavior change may lead to improved health.  Promote healthy lifestyle.  Evoke change talk using open-ended questions, pros and cons, as well as looking forward.  Identify and manage conditions or preconditions to reduce health risk.  Implement additional goals and interventions based on identified risk factors.  Include child's contributions based on age and understanding to self-management plan.  Support child and caregiver active participation in decision-making and self-management plan.     Follow Up:  Patient agrees to Care Plan and Follow-up.  Plan: The Managed Medicaid care management team will reach out to the patient again over the next 30 days. and The  Patient has been provided with contact information for the Managed Medicaid care management team and has been advised to call with any health related questions or concerns.  Date/time of next scheduled RN care management/care coordination outreach:  01/30/21 at 0900.

## 2021-01-03 ENCOUNTER — Telehealth: Payer: Self-pay | Admitting: Nurse Practitioner

## 2021-01-03 DIAGNOSIS — E11649 Type 2 diabetes mellitus with hypoglycemia without coma: Secondary | ICD-10-CM | POA: Diagnosis not present

## 2021-01-03 DIAGNOSIS — R55 Syncope and collapse: Secondary | ICD-10-CM | POA: Diagnosis not present

## 2021-01-03 DIAGNOSIS — Z7985 Long-term (current) use of injectable non-insulin antidiabetic drugs: Secondary | ICD-10-CM | POA: Diagnosis not present

## 2021-01-03 DIAGNOSIS — Z7984 Long term (current) use of oral hypoglycemic drugs: Secondary | ICD-10-CM | POA: Diagnosis not present

## 2021-01-03 DIAGNOSIS — R42 Dizziness and giddiness: Secondary | ICD-10-CM | POA: Diagnosis not present

## 2021-01-04 ENCOUNTER — Telehealth: Payer: Self-pay

## 2021-01-04 NOTE — Telephone Encounter (Signed)
Transition Care Management Unsuccessful Follow-up Telephone Call  Date of discharge and from where:  01/03/2021 from [facility]  Attempts:  1st Attempt  Reason for unsuccessful TCM follow-up call:  Left voice message

## 2021-01-05 ENCOUNTER — Encounter: Payer: Self-pay | Admitting: Emergency Medicine

## 2021-01-05 ENCOUNTER — Other Ambulatory Visit: Payer: Self-pay

## 2021-01-05 ENCOUNTER — Ambulatory Visit
Admission: EM | Admit: 2021-01-05 | Discharge: 2021-01-05 | Disposition: A | Discharge status: Home / Self Care | Payer: Medicaid Other | Attending: Physician Assistant | Admitting: Physician Assistant

## 2021-01-05 DIAGNOSIS — B349 Viral infection, unspecified: Secondary | ICD-10-CM

## 2021-01-05 LAB — POCT FASTING CBG KUC MANUAL ENTRY: POCT Glucose (KUC): 100 mg/dL — AB (ref 70–99)

## 2021-01-05 MED ORDER — ONDANSETRON 4 MG PO TBDP: 4.0000 mg | ORAL_TABLET | Freq: Three times a day (TID) | ORAL | 0 refills | Status: DC | PRN

## 2021-01-05 MED ORDER — ONDANSETRON 4 MG PO TBDP
4.0000 mg | ORAL_TABLET | Freq: Once | ORAL | Status: AC
  Administered 2021-01-05: 4 mg via ORAL

## 2021-01-05 NOTE — ED Triage Notes (Signed)
Patient c/o fever, generalized body aches, and vomiting that started today.   Patient endorses a temperature of 102 F at it's highest.   Patient endorses 2 episodes of vomiting today.   Patient endorses an episodes of hypoglycemia 2 days ago, patient was seen at "[facility] ER".   History of DM per patient statement.

## 2021-01-05 NOTE — Telephone Encounter (Signed)
Transition Care Management Follow-up Telephone Call Date of discharge and from where: 01/03/2021-[facility] How have you been since you were released from the hospital? Patient stated she is doing fine.  Any questions or concerns? No  Items Reviewed: Did the pt receive and understand the discharge instructions provided? Yes  Medications obtained and verified? Yes  Other? No  Any new allergies since your discharge? No  Dietary orders reviewed? No Do you have support at home? Yes   Home Care and Equipment/Supplies: Were home health services ordered? not applicable If so, what is the name of the agency? N/A  Has the agency set up a time to come to the patient's home? not applicable Were any new equipment or medical supplies ordered?  No What is the name of the medical supply agency? N/A Were you able to get the supplies/equipment? not applicable Do you have any questions related to the use of the equipment or supplies? No  Functional Questionnaire: (I = Independent and D = Dependent) ADLs: I  Bathing/Dressing- I  Meal Prep- I  Eating- I  Maintaining continence- I  Transferring/Ambulation- I  Managing Meds- I  Follow up appointments reviewed:  PCP Hospital f/u appt confirmed? No   Specialist Hospital f/u appt confirmed? No  Are transportation arrangements needed? No  If their condition worsens, is the pt aware to call PCP or go to the Emergency Dept.? Yes Was the patient provided with contact information for the PCP's office or ED? Yes Was to pt encouraged to call back with questions or concerns? Yes

## 2021-01-05 NOTE — Patient Outreach (Signed)
Medicaid Managed Care Social Work Note  01/05/2021 Name:  Monique Forbes MRN:  [id number] DOB:  04-11-1990  Monique Forbes is an 30 y.o. year old female who is a primary patient of Abernathy, Yetta Flock, NP.  The Medicaid Managed Care Coordination team was consulted for assistance with:  Community Resources   Monique Forbes was given information about Medicaid Managed Care Coordination team services today. Monique Forbes Patient agreed to services and verbal consent obtained.  Engaged with patient  for by telephone forinitial visit in response to referral for case management and/or care coordination services.   Assessments/Interventions:  Review of past medical history, allergies, medications, health status, including review of consultants reports, laboratory and other test data, was performed as part of comprehensive evaluation and provision of chronic care management services.  SDOH: (Social Determinant of Health) assessments and interventions performed:  01/05/21: BSW contacted patient regarding rent and utility assistance. Patient asked for resources to be sent to her email at [email or web address]. BSW will send patient a list of resources for Monique Forbes.  Advanced Directives Status:  Not addressed in this encounter.  Care Plan                 Allergies  Allergen Reactions   Bee Venom Anaphylaxis   Other Anaphylaxis    Medications Reviewed Today     Reviewed by Monique Medicus, RN (Registered Nurse) on 01/02/21 at 1338  Med List Status: <None>   Medication Order Taking? Sig Documenting Provider Last Dose Status Informant  amLODipine (NORVASC) 5 MG tablet [id number] Yes Take 1 tablet (5 mg total) by mouth daily. Monique Osgood, NP Taking Active   Dulaglutide 1.5 MG/0.5ML SOPN [id number] Yes Inject 1.5 mg into the skin once a week. Monique Osgood, NP Taking Active    Patient not taking:   Discontinued 08/04/20 1851 metFORMIN (GLUCOPHAGE-XR) 500 MG 24 hr tablet [id number]  Yes Take 2 tablets (1,000 mg total) by mouth daily with breakfast. Monique Osgood, NP Taking Active   omeprazole (PRILOSEC) 20 MG capsule [id number] No Take 1 capsule (20 mg total) by mouth daily.  Patient not taking: Reported on 01/02/2021   Monique Ochoa, NP Not Taking Active Self  Probiotic Product (PROBIOTIC PO) [id number] Yes Take 1 tablet by mouth daily. [provider] Taking Active Self  spironolactone (ALDACTONE) 25 MG tablet [id number] Yes Take 1 tablet (25 mg total) by mouth daily. Monique Osgood, NP Taking Active             Patient Active Problem List   Diagnosis Date Noted   Uncontrolled type 2 diabetes mellitus with hyperglycemia (Monique Forbes) 11/22/2020   Dizziness 06/06/2019   Personal history of COVID-19 04/12/2019   Essential hypertension 04-12-19   Shortness of breath 04/12/19   Family history of sudden cardiac death in mother 04-12-19   Heart murmur 2019/04/12   Screening for hyperlipidemia 04-12-19   Diabetes mellitus type 2, controlled, without complications ([place]) 88/28/0034   Neck swelling 08/12/2018   Pain, dental 08/12/2018   PCOS (polycystic ovarian syndrome) 08/12/2018   Sepsis (Monique Forbes) 08/12/2018   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   Anxiety and depression 08/23/2016   Severe recurrent major depression without psychotic features ([place]) 08/23/2016   Moderate episode of recurrent major depressive disorder ([place]) 08/21/2016   Insulin resistance 02/24/2016   Hypertriglyceridemia 02/24/2016   History of PCOS 07/23/2013    Conditions to be addressed/monitored per PCP order:   rent and utility resources  Care Plan :  RN Care Manager Plan of Care  Updates made by Monique Forbes since 01/05/2021 12:00 AM     Problem: Health Promotion or Disease Self-Management (General Plan of Care)      Long-Range Goal: Chronic Disease Management and Care Coordination Needs   Start Date:  01/02/2021  Expected End Date: 04/04/2021  Priority: High  Note:   Current Barriers:  Knowledge Deficits related to plan of care for management of HTN, DMII, Anxiety, and Depression Care Coordination needs related to anxiety, depression, HTN, DM, financial resources.  Patient states she had a blood pressure of 156/92 today and blood glucose of 500 yesterday-h/o MI in family. Chronic Disease Management support and education needs related to HTN, DMII, Anxiety, and Depression Financial Constraints-needs rent and utility assistance 01/05/21: BSW contacted patient regarding rent and utility assistance. Patient asked for resources to be sent to her email at [email or web address]. BSW will send patient a list of resources for Monique Forbes.  RNCM Clinical Goal(s):  Patient will verbalize understanding of plan for management of HTN, DMII, Anxiety, and Depression verbalize basic understanding of  HTN, DMII, Anxiety, and Depression disease process and self health management plan  take all medications exactly as prescribed and will call provider for medication related questions demonstrate understanding of rationale for each prescribed medication  attend all scheduled medical appointments:  continue to work with RN Care Manager to address care management and care coordination needs related to  HTN, DMII, Anxiety, and Depression work with pharmacist to address medicaitons work with Education officer, museum to address  related to the management of Financial constraints related to rent, utilities  and Monique Forbes Concerns  related to the management of Anxiety and Depression through collaboration with Consulting civil engineer, provider, and care team.   Interventions: Inter-disciplinary care team collaboration (see longitudinal plan of care) Evaluation of current treatment plan related to  self management and patient's adherence to plan as established by provider Collaboration with SW for financial resources, anxiety  and depression Collaboration with PCP for dietary referral BSW referral for resources LCSW referral for anxiety, depression, PTSD   Diabetes Interventions: Assessed patient's understanding of A1c goal: <7% Reviewed medications with patient and discussed importance of medication adherence; Counseled on importance of regular laboratory monitoring as prescribed; Discussed plans with patient for ongoing care management follow up and provided patient with direct contact information for care management team; Reviewed scheduled/upcoming provider appointments  Advised patient, providing education and rationale, to check cbg and record, calling  for findings outside established parameters; Referral made to pharmacy team for assistance with medicaitons; Review of patient status, including review of consultants reports, relevant laboratory and other test results, and medications completed; Assessed social determinant of health barriers;  Lab Results  Component Value Date   HGBA1C 7.0 (A) 10/24/2020     Hypertension Interventions: Last practice recorded BP readings:  BP Readings from Last 3 Encounters:  11/22/20 130/82  11/03/20 140/84  10/24/20 (!) 144/90  Most recent eGFR/CrCl:  Lab Results  Component Value Date   EGFR 120 05/27/2020    No components found for: CRCL  Evaluation of current treatment plan related to hypertension self management and patient's adherence to plan as established by provider; Reviewed medications with patient and discussed importance of compliance; Counseled on the importance of exercise goals with target of 150 minutes per week Discussed plans with patient for ongoing care management follow up and provided patient with direct contact information for care management team; Advised patient, providing education and rationale,  to monitor blood pressure daily and record, calling PCP for findings outside established parameters;  Reviewed scheduled/upcoming provider  appointments Advised patient to discuss blood pressure and blood glucose readings  with provider; Assessed social determinant of health barriers;   Patient Goals/Self-Care Activities: Patient will self administer medications as prescribed Patient will attend all scheduled provider appointments Patient will call pharmacy for medication refills Patient will continue to perform ADL's independently Patient will call provider office for new concerns or questions Patient will work with BSW to address care coordination needs and will continue to work with the clinical team to address health care and disease management related needs.    Follow Up Plan:  The care management team will reach out to the patient again over the next 30 days.     Evidence-based guidance:   Review biopsychosocial determinants of health screens.  Review need for preventive screening based on age, sex, family history and health history.  Determine level of modifiable health risk.  Assess level of child and caregiver activation, level of readiness, importance and confidence to make changes.  Discuss identified risks.  Identify areas where behavior change may lead to improved health.  Promote healthy lifestyle.  Evoke change talk using open-ended questions, pros and cons, as well as looking forward.  Identify and manage conditions or preconditions to reduce health risk.  Implement additional goals and interventions based on identified risk factors.  Include child's contributions based on age and understanding to self-management plan.  Support child and caregiver active participation in decision-making and self-management plan.    Notes:      Follow up:  Patient agrees to Care Plan and Follow-up.  Plan: The Managed Medicaid care management team will reach out to the patient again over the next 14 days.  Date/time of next scheduled Social Work care management/care coordination outreach:  01/25/21  Mickel Fuchs,  Arita Miss, [place] Managed Medicaid Team  [phone]

## 2021-01-05 NOTE — Patient Instructions (Signed)
Visit Information  [name] was given information about Medicaid Managed Care team care coordination services as a part of their [facility] Medicaid benefit. Monique Forbes verbally consented to engagement with the [facility] Managed Care team.   If you are experiencing a medical emergency, please call 911 or report to your local emergency department or urgent care.   If you have a non-emergency medical problem during routine business hours, please contact your provider's office and ask to speak with a nurse.   For questions related to your [facility] health plan, please call: [phone] or go here:[email or web address]  If you would like to schedule transportation through your [facility] plan, please call the following number at least 2 days in advance of your appointment: [phone].  Call the [facility] Crisis Line at [phone], at any time, 24 hours a day, 7 days a week. If you are in danger or need immediate medical attention call 911.  If you would like help to quit smoking, call 1-800-QUIT-NOW ((581) 662-8160) OR Espaol: 1-855-Djelo-Ya (6-283-151-7616) o para ms informacin haga clic aqu or Text READY to 073-710 to register via text  [name] - following are the goals we discussed in your visit today:   Goals Addressed   None     Social Worker will follow up with patient in 14 days.   Gus Puma, BSW, [facility]  [place]  High Risk Managed Medicaid Team  [phone]   Following is a copy of your plan of care:

## 2021-01-05 NOTE — Discharge Instructions (Addendum)
Eat and drink normally.  Zofran for nausea. Covid and influenza test are pending

## 2021-01-06 LAB — COVID-19, FLU A+B NAA
Influenza A, NAA: NOT DETECTED
Influenza B, NAA: NOT DETECTED
SARS-CoV-2, NAA: NOT DETECTED
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
## 2021-01-09 ENCOUNTER — Other Ambulatory Visit: Payer: Medicaid Other | Admitting: Obstetrics and Gynecology

## 2021-01-09 ENCOUNTER — Telehealth: Payer: Self-pay

## 2021-01-09 ENCOUNTER — Other Ambulatory Visit: Payer: Self-pay

## 2021-01-09 ENCOUNTER — Other Ambulatory Visit: Payer: Self-pay | Admitting: Nurse Practitioner

## 2021-01-09 DIAGNOSIS — Z6835 Body mass index (BMI) 35.0-35.9, adult: Secondary | ICD-10-CM

## 2021-01-09 DIAGNOSIS — E1165 Type 2 diabetes mellitus with hyperglycemia: Secondary | ICD-10-CM

## 2021-01-09 NOTE — Telephone Encounter (Signed)
Referral sent to [facility] in [place]

## 2021-01-09 NOTE — Patient Outreach (Signed)
Care Coordination  01/09/2021  Monique Forbes 25-Jun-1990 [id number]  RNCM called patient's PCP office to follow up on patient request for dietary referral.  Message left with nurse for return call.  Kathi Der RN, BSN [place]  [employer] - Managed Medicaid High Risk [phone].

## 2021-01-09 NOTE — Telephone Encounter (Signed)
Spoke with Camelia Eng, patient's case worker regarding referral for dietary/nutrition assistance for diabetes. Sent secure chat to [employer]

## 2021-01-09 NOTE — Patient Outreach (Signed)
Care Coordination  01/09/2021  Daleah Dowers 22-Dec-1990 [id number]  Patient's PCP office returned call regarding dietary referral for patient, referral placed.  Kathi Der RN, BSN [place]  [employer] - Managed Medicaid High Risk [phone]

## 2021-01-10 ENCOUNTER — Other Ambulatory Visit: Payer: Self-pay | Admitting: Licensed Clinical Social Worker

## 2021-01-10 DIAGNOSIS — F32A Depression, unspecified: Secondary | ICD-10-CM

## 2021-01-10 DIAGNOSIS — Z419 Encounter for procedure for purposes other than remedying health state, unspecified: Secondary | ICD-10-CM | POA: Diagnosis not present

## 2021-01-10 DIAGNOSIS — F331 Major depressive disorder, recurrent, moderate: Secondary | ICD-10-CM
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
## 2021-01-10 NOTE — Patient Instructions (Signed)
Visit Information  Monique Forbes was given information about Medicaid Managed Care team care coordination services as a part of their [facility] Medicaid benefit. Monique Forbes verbally consented to engagement with the [facility] Managed Care team.   If you are experiencing a medical emergency, please call 911 or report to your local emergency department or urgent care.   If you have a non-emergency medical problem during routine business hours, please contact your provider's office and ask to speak with a nurse.   For questions related to your [facility] health plan, please call: [phone] or go here:[email or web address]  If you would like to schedule transportation through your [facility] plan, please call the following number at least 2 days in advance of your appointment: [phone].  Call the [facility] Crisis Line at [phone], at any time, 24 hours a day, 7 days a week. If you are in danger or need immediate medical attention call 911.  If you would like help to quit smoking, call 1-800-QUIT-NOW (585-164-6042) OR Espaol: 1-855-Djelo-Ya (2-119-417-4081) o para ms informacin haga clic aqu or Text READY to 448-185 to register via text  Monique Forbes - following are the goals we discussed in your visit today:   Goals Addressed             This Visit's Progress    Manage My Emotions       Timeframe:  Long-Range Goal Priority:  High Start Date:      01/10/21                       Expected End Date:   ongoing                    Follow Up Date 01/24/21    - begin personal counseling - call and visit an old friend - check out volunteer opportunities - join a support group - laugh; watch a funny movie or comedian - learn and use visualization or guided imagery - perform a random act of kindness - practice relaxation or meditation daily - start or continue a personal journal - talk about feelings with a friend, family or spiritual advisor -  practice positive thinking and self-talk    Why is this important?   When you are stressed, down or upset, your body reacts too.  For example, your blood pressure may get higher; you may have a headache or stomachache.  When your emotions get the best of you, your body's ability to fight off cold and flu gets weak.  These steps will help you manage your emotions.     Notes:        Dickie La, BSW, MSW, [employer] Managed Medicaid LCSW [facility]  [facility] [email or web address] Phone: [phone]

## 2021-01-12 ENCOUNTER — Other Ambulatory Visit: Payer: Self-pay

## 2021-01-12 ENCOUNTER — Telehealth: Payer: Self-pay

## 2021-01-12 DIAGNOSIS — I10 Essential (primary) hypertension: Secondary | ICD-10-CM

## 2021-01-12 MED ORDER — AMLODIPINE BESYLATE 5 MG PO TABS: 5.0000 mg | ORAL_TABLET | Freq: Every day | ORAL | 0 refills | Status: DC

## 2021-01-12 NOTE — Telephone Encounter (Signed)
Patient called needing Norvasc refill sent into [facility]

## 2021-01-19 ENCOUNTER — Other Ambulatory Visit: Payer: Self-pay

## 2021-01-19 ENCOUNTER — Encounter: Payer: Self-pay | Admitting: Nurse Practitioner

## 2021-01-19 ENCOUNTER — Ambulatory Visit: Payer: Medicaid Other | Admitting: Nurse Practitioner

## 2021-01-19 VITALS — BP 130/88 | HR 98 | Temp 98.7°F | Resp 16 | Ht 65.0 in | Wt 207.8 lb

## 2021-01-19 DIAGNOSIS — Z6837 Body mass index (BMI) 37.0-37.9, adult: Secondary | ICD-10-CM

## 2021-01-19 DIAGNOSIS — K219 Gastro-esophageal reflux disease without esophagitis: Secondary | ICD-10-CM | POA: Diagnosis not present

## 2021-01-19 DIAGNOSIS — I10 Essential (primary) hypertension: Secondary | ICD-10-CM

## 2021-01-19 DIAGNOSIS — E1165 Type 2 diabetes mellitus with hyperglycemia: Secondary | ICD-10-CM | POA: Diagnosis not present

## 2021-01-19 LAB — POCT GLYCOSYLATED HEMOGLOBIN (HGB A1C): Hemoglobin A1C: 5.7 % — AB (ref 4.0–5.6)

## 2021-01-19 MED ORDER — PANTOPRAZOLE SODIUM 40 MG PO TBEC: 40.0000 mg | DELAYED_RELEASE_TABLET | Freq: Every day | ORAL | 20 refills | Status: DC
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
## 2021-01-23 ENCOUNTER — Ambulatory Visit: Payer: Medicaid Other | Admitting: Nurse Practitioner

## 2021-01-23 ENCOUNTER — Telehealth: Payer: Self-pay

## 2021-01-24 ENCOUNTER — Telehealth: Payer: Self-pay | Admitting: Pharmacist

## 2021-01-24 ENCOUNTER — Other Ambulatory Visit: Payer: Self-pay | Admitting: Licensed Clinical Social Worker

## 2021-01-24 ENCOUNTER — Ambulatory Visit: Payer: Self-pay

## 2021-01-24 DIAGNOSIS — F419 Anxiety disorder, unspecified: Secondary | ICD-10-CM

## 2021-01-24 DIAGNOSIS — F331 Major depressive disorder, recurrent, moderate: Secondary | ICD-10-CM

## 2021-01-24 NOTE — Patient Instructions (Signed)
Visit Information  Monique Forbes was given information about Medicaid Managed Care team care coordination services as a part of their [facility] Medicaid benefit. Monique Forbes verbally consented to engagement with the [facility] Managed Care team.   If you are experiencing a medical emergency, please call 911 or report to your local emergency department or urgent care.   If you have a non-emergency medical problem during routine business hours, please contact your provider's office and ask to speak with a nurse.   For questions related to your [facility] health plan, please call: [phone] or go here:[email or web address]  If you would like to schedule transportation through your [facility] plan, please call the following number at least 2 days in advance of your appointment: [phone].  Call the [facility] Crisis Line at [phone], at any time, 24 hours a day, 7 days a week. If you are in danger or need immediate medical attention call 911.  If you would like help to quit smoking, call 1-800-QUIT-NOW (912-707-1235) OR Espaol: 1-855-Djelo-Ya (2-355-732-2025) o para ms informacin haga clic aqu or Text READY to 427-062 to register via text   Following is a copy of your plan of care:  Care Plan : General Social Work (Adult)  Updates made by Monique Bryant, Monique Forbes since 01/24/2021 12:00 AM     Problem: Depression Identification (Depression)      Long-Range Goal: Depressive Symptoms Identified   Start Date: 01/10/2021  Priority: High  Note:   Timeframe:  Long-Range Goal Priority:  High Start Date:      01/10/21                       Expected End Date:   ongoing                    Follow Up Date 02/07/21   Clinical Goal(s): verbalize understanding of plan for management of Anxiety and Depression   Clinical Interventions:  Assessed patient's previous and current treatment, coping skills, support system and barriers to care  Depression screen  reviewed  PHQ2/ PHQ9 completed Mindfulness or Relaxation training provided Active listening / Reflection utilized  Behavioral Activation reviewed Provided psychoeducation for mental health needs  Provided brief CBT  Quality of sleep assessed & Sleep Hygiene techniques promoted  Participation in counseling encouraged  Verbalization of feelings encouraged  Crisis Resource Education / information provided  Suicidal Ideation/Homicidal Ideation assessed: Discussed Health Care Power of Monique Forbes  ; Review various resources, discussed options and provided patient information about  Options for mental health treatment based on need and insurance  Patient Goals/Self-Care Activities: Over the next 120 days connect with provider for ongoing mental health treatment.   Increase coping skills, healthy habits, self-management skills, and stress reduction     24- Hour Availability:    [facility]  [address] [place], [place] [phone] Crisis [phone]   [facility] [phone]   [place] [facility]  [phone]    [facility]  [phone] (after hours)   Therapeutic Alternative/Mobile Crisis   [phone]   [place] National Suicide Hotline  803-478-1711 (TALK) OR 988   Call 911 or go to emergency room   [facility]  [phone]);  [place] and [employer]  [phone]); [place], [place], [place], [place], [place], [place], [place], [place], MSW, [employer] Managed [place]  Monique Forbes [place]  [email or web address] Phone: [phone]

## 2021-01-24 NOTE — Patient Outreach (Signed)
01/24/2021 Name: Monique Forbes MRN: [id number] DOB: 1990-09-07  Referred by: Sallyanne Kuster, NP Reason for referral : No chief complaint on file.   An unsuccessful telephone outreach was attempted today. The patient was referred to the case management team for assistance with care management and care coordination. Phone was initially answered and upon explaining who I was it appeared the person on the other end hung up the phone. Called phone number again and it proceeded to ring and go to voicemail.   Follow Up Plan: The Managed Medicaid care management team will reach out to the patient again over the next 10 days.   Cheral Almas PharmD, CPP High Risk Managed Medicaid [place] [phone]

## 2021-01-30 ENCOUNTER — Other Ambulatory Visit: Payer: Self-pay | Admitting: Obstetrics and Gynecology

## 2021-01-30 NOTE — Patient Instructions (Signed)
Hi Monique Forbes, I am sorry I missed you today, I hope you are doing okay - as a part of your Medicaid benefit, you are eligible for care management and care coordination services at no cost or copay. I was unable to reach you by phone today but would be happy to help you with your health related needs. Please feel free to call me at [phone].  A member of the Managed Medicaid care management team will reach out to you again over the next 7-14 days.   Kathi Der RN, BSN [place]  [employer] - Managed Medicaid High Risk [phone]

## 2021-01-30 NOTE — Patient Outreach (Signed)
Care Coordination  01/30/2021  Monique Forbes Oct 31, 1990 [id number]   Medicaid Managed Care   Unsuccessful Outreach Note  01/30/2021 Name: Monique Forbes MRN: [id number] DOB: May 09, 1990  Referred by: Sallyanne Kuster, NP Reason for referral : High Risk Managed Medicaid (Unsuccessful telephone outreach)   An unsuccessful telephone outreach was attempted today. The patient was referred to the case management team for assistance with care management and care coordination.   Follow Up Plan: The care management team will reach out to the patient again over the next 7-14 days.   Kathi Der RN, BSN [place]  [employer] - Managed Medicaid High Risk [phone].

## 2021-01-31 NOTE — Telephone Encounter (Signed)
error 

## 2021-02-01 DIAGNOSIS — Z20822 Contact with and (suspected) exposure to covid-19: Secondary | ICD-10-CM | POA: Diagnosis not present

## 2021-02-01 DIAGNOSIS — H9203 Otalgia, bilateral: Secondary | ICD-10-CM | POA: Diagnosis not present

## 2021-02-01 DIAGNOSIS — J069 Acute upper respiratory infection, unspecified: Secondary | ICD-10-CM | POA: Diagnosis not present

## 2021-02-01 DIAGNOSIS — J029 Acute pharyngitis, unspecified: Secondary | ICD-10-CM | POA: Diagnosis not present

## 2021-02-07 ENCOUNTER — Other Ambulatory Visit: Payer: Self-pay

## 2021-02-07 NOTE — Patient Instructions (Signed)
Monique Forbes ,   The [facility] Managed Care Team is available to provide assistance to you with your healthcare needs at no cost and as a benefit of your [facility] plan. I'm sorry I was unable to reach you today for our scheduled appointment. Our care guide will call you to reschedule our telephone appointment. Please call me at the number below. I am available to be of assistance to you regarding your healthcare needs. .   Thank you,   Dickie La, BSW, MSW, LCSW Managed Medicaid LCSW [facility]  [address] [email or web address] Phone: [phone]

## 2021-02-07 NOTE — Patient Outreach (Signed)
[facility]) Care Management  02/07/2021  Monique Forbes 01-04-1991 [id number]  LCSW completed [facility] outreach attempt today during scheduled appointment time but was unable to reach patient successfully. A HIPPA compliant voice message was left encouraging patient to return call once available. LCSW will ask Scheduling Care Guide to reschedule [facility] appointment with patient as well.  Dickie La, BSW, MSW, [employer] Managed Medicaid LCSW [facility]  [facility] [email or web address] Phone: [phone]

## 2021-02-09 DIAGNOSIS — Z419 Encounter for procedure for purposes other than remedying health state, unspecified: Secondary | ICD-10-CM | POA: Diagnosis not present

## 2021-02-13 ENCOUNTER — Other Ambulatory Visit: Payer: Self-pay

## 2021-02-13 NOTE — Patient Outreach (Signed)
Care Coordination  02/13/2021  Tashanda Abplanalp 1990/08/26 [id number]   Medicaid Managed Care   Unsuccessful Outreach Note  02/13/2021 Name: Odelle Kosier MRN: [id number] DOB: May 09, 1990  Referred by: Sallyanne Kuster, NP Reason for referral : High Risk Managed Medicaid (Unsuccessful telephone outreach)   Third unsuccessful telephone outreach was attempted.  The patient was referred to the case management team for assistance with care management and care coordination. The patient's primary care provider has been notified of our unsuccessful attempts to make or maintain contact with the patient. The care management team is pleased to engage with this patient at any time in the future should he/she be interested in assistance from the care management team.   Follow Up Plan: We have been unable to make contact with the patient for follow up. The care management team is available to follow up with the patient after provider conversation with the patient regarding recommendation for care management engagement and subsequent re-referral to the care management team.   Kathi Der RN, BSN [place]  [facility] Care Management Coordinator - Managed Medical [facility] High Risk [phone]

## 2021-02-13 NOTE — Patient Instructions (Signed)
Hi [name]- as a part of your Medicaid benefit, you are eligible for care management and care coordination services at no cost or copay. We have been  unable to reach you by phone today but would be happy to help you with your health related needs. Please feel free to call me at [phone].  Kathi Der RN, BSN [place]  [employer] - Managed Medicaid High Risk [phone].

## 2021-02-20 ENCOUNTER — Ambulatory Visit: Payer: Medicaid Other

## 2021-02-22 ENCOUNTER — Ambulatory Visit: Payer: Medicaid Other | Admitting: Nutrition

## 2021-03-12 DIAGNOSIS — Z419 Encounter for procedure for purposes other than remedying health state, unspecified: Secondary | ICD-10-CM | POA: Diagnosis not present

## 2021-03-16 ENCOUNTER — Emergency Department (HOSPITAL_COMMUNITY)
Admission: EM | Admit: 2021-03-16 | Discharge: 2021-03-16 | Disposition: A | Discharge status: Home / Self Care | Payer: Medicaid Other | Source: Home / Self Care

## 2021-03-16 DIAGNOSIS — R55 Syncope and collapse: Secondary | ICD-10-CM | POA: Diagnosis not present

## 2021-03-16 DIAGNOSIS — K30 Functional dyspepsia: Secondary | ICD-10-CM | POA: Diagnosis not present

## 2021-03-16 DIAGNOSIS — W228XXA Striking against or struck by other objects, initial encounter: Secondary | ICD-10-CM | POA: Diagnosis not present

## 2021-03-16 DIAGNOSIS — S0990XA Unspecified injury of head, initial encounter: Secondary | ICD-10-CM | POA: Diagnosis not present

## 2021-03-16 DIAGNOSIS — K2900 Acute gastritis without bleeding: Secondary | ICD-10-CM | POA: Diagnosis not present

## 2021-03-16 DIAGNOSIS — Z880 Allergy status to penicillin: Secondary | ICD-10-CM | POA: Diagnosis not present

## 2021-03-16 DIAGNOSIS — R0600 Dyspnea, unspecified: Secondary | ICD-10-CM | POA: Diagnosis not present

## 2021-03-16 DIAGNOSIS — E119 Type 2 diabetes mellitus without complications: Secondary | ICD-10-CM | POA: Diagnosis not present

## 2021-03-16 DIAGNOSIS — S060X1A Concussion with loss of consciousness of 30 minutes or less, initial encounter: Secondary | ICD-10-CM | POA: Diagnosis not present

## 2021-03-17 ENCOUNTER — Other Ambulatory Visit: Payer: Self-pay

## 2021-03-17 DIAGNOSIS — E282 Polycystic ovarian syndrome: Secondary | ICD-10-CM

## 2021-03-17 MED ORDER — SPIRONOLACTONE 25 MG PO TABS
25.0000 mg | ORAL_TABLET | Freq: Every day | ORAL | 3 refills | Status: DC
Start: 2021-03-17 — End: 2021-07-20

## 2021-03-20 ENCOUNTER — Encounter: Payer: Self-pay | Admitting: Nurse Practitioner

## 2021-03-20 ENCOUNTER — Other Ambulatory Visit: Payer: Self-pay

## 2021-03-20 ENCOUNTER — Encounter (HOSPITAL_COMMUNITY): Payer: Self-pay

## 2021-03-20 ENCOUNTER — Ambulatory Visit: Payer: Medicaid Other | Admitting: Nurse Practitioner

## 2021-03-20 ENCOUNTER — Ambulatory Visit (HOSPITAL_COMMUNITY)
Admission: RE | Admit: 2021-03-20 | Discharge: 2021-03-20 | Disposition: A | Discharge status: Home / Self Care | Payer: Medicaid Other | Source: Ambulatory Visit | Attending: Nurse Practitioner | Admitting: Nurse Practitioner

## 2021-03-20 VITALS — BP 134/86 | HR 90 | Temp 98.5°F | Resp 16 | Ht 65.0 in | Wt 210.0 lb

## 2021-03-20 DIAGNOSIS — K56609 Unspecified intestinal obstruction, unspecified as to partial versus complete obstruction: Secondary | ICD-10-CM

## 2021-03-20 DIAGNOSIS — R55 Syncope and collapse: Secondary | ICD-10-CM

## 2021-03-20 DIAGNOSIS — K760 Fatty (change of) liver, not elsewhere classified: Secondary | ICD-10-CM | POA: Diagnosis not present

## 2021-03-20 DIAGNOSIS — R1114 Bilious vomiting: Secondary | ICD-10-CM | POA: Insufficient documentation

## 2021-03-20 DIAGNOSIS — R1111 Vomiting without nausea: Secondary | ICD-10-CM | POA: Diagnosis not present

## 2021-03-20 MED ORDER — ONDANSETRON 4 MG PO TBDP: 4.0000 mg | ORAL_TABLET | Freq: Three times a day (TID) | ORAL | 0 refills | Status: DC | PRN
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
## 2021-03-21 ENCOUNTER — Ambulatory Visit (HOSPITAL_COMMUNITY): Payer: Medicaid Other

## 2021-03-22 NOTE — Progress Notes (Signed)
Please call patient and let her know that there is no abnormalities seen on her CT scan that would explain her symptoms. She has no bowel obstruction or ileus. I will order an urgent referral to gastroenterology as I previously discussed with her.

## 2021-03-23 NOTE — Progress Notes (Signed)
[name] pt needs to know that there is no abnormalities seen on her CT scan that would explain her symptoms. She has no bowel obstruction or ileus. Alyssa will order an urgent referral to gastroenterology as previously discussed with pt.

## 2021-03-29 ENCOUNTER — Other Ambulatory Visit: Payer: Self-pay

## 2021-03-29 ENCOUNTER — Encounter: Payer: Medicaid Other | Attending: Nurse Practitioner | Admitting: Nutrition

## 2021-03-29 ENCOUNTER — Encounter: Payer: Self-pay | Admitting: Nutrition

## 2021-03-29 VITALS — Ht 65.0 in | Wt 210.0 lb

## 2021-03-29 DIAGNOSIS — Z6835 Body mass index (BMI) 35.0-35.9, adult: Secondary | ICD-10-CM | POA: Insufficient documentation

## 2021-03-29 DIAGNOSIS — I10 Essential (primary) hypertension: Secondary | ICD-10-CM

## 2021-03-29 DIAGNOSIS — E781 Pure hyperglyceridemia: Secondary | ICD-10-CM

## 2021-03-29 DIAGNOSIS — E1165 Type 2 diabetes mellitus with hyperglycemia: Secondary | ICD-10-CM | POA: Diagnosis not present

## 2021-03-29 DIAGNOSIS — Z8742 Personal history of other diseases of the female genital tract: Secondary | ICD-10-CM

## 2021-03-29 NOTE — Progress Notes (Signed)
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 8 Preferred learning style: no preference.  Learning readiness: Ready    NUTRITION ASSESSMENT  Passes out sometimes. Has passed out 6 times in the last 2 months. Was taken out of work due to current medical issue of passing out. Testing blood sugars in am. Has been eating 1-2 meals per day. Doesn't get hungry. Gets nauseated often. Current Trulicity has caused a lot of reflux issues and makes her feel not well. FBS 90's mg/dl   Sometimes checks BS in evening 120-130's. Trulicity weekly.  Was on ozmepic  but insurance didn't cover it. Family history of heart attacks. Her mom died at 86 from a HA and was type 1. Sister died at 99 from a HA but never went to the doctor for medical care.  Appears to may have reactive hypoglycemia. Suggest referral to Endocrinology. May consider stopping Trulicity for now until she can get back to eating 3 balanced meals and stablize blood sugar.  A1C 5.7%.  Anthropometrics   Has lost down from 232 lbs. Wants to lose weight. Wt Readings from Last 3 Encounters:  03/29/21 210 lb (95.3 kg)  03/20/21 210 lb (95.3 kg)  01/19/21 207 lb 12.8 oz (94.3 kg)   Ht Readings from Last 3 Encounters:  03/29/21 5\' 5"  (1.651 m)  03/20/21 5\' 5"  (1.651 m)  01/19/21 5\' 5"  (1.651 m)   Body mass index is 34.95 kg/m. @BMIFA @ Facility age limit for growth percentiles is 20 years. Facility age limit for growth percentiles is 20 years.   Clinical Medical Hx: PCOS, Type 2, Depression, Medication and Metformin  XR 1000 mg daily Labs:   Lab Results  Component Value Date   HGBA1C 5.7 (A) 01/19/2021   CMP Latest Ref Rng & Units 09/30/2020 09/03/2020 08/04/2020  Glucose 70 - 99 mg/dL 13/12/2020) 10/02/2020) 09/05/2020)  BUN 6 - 20 mg/dL 11 13 10   Creatinine 0.44 - 1.00 mg/dL 08/06/2020 283(T 517(O  Sodium 135 - 145 mmol/L 138 137 136  Potassium 3.5 -  5.1 mmol/L 3.8 3.5 3.2(L)  Chloride 98 - 111 mmol/L 103 103 104  CO2 22 - 32 mmol/L 27 24 23   Calcium 8.9 - 10.3 mg/dL 160(V) 8.9 )  Total Protein 6.5 - 8.1 g/dL 7.3 7.0 -  Total Bilirubin 0.3 - 1.2 mg/dL 0.6 0.3 -  Alkaline Phos 38 - 126 U/L 35(L) 37(L) -  AST 15 - 41 U/L 39 55(H) -  ALT 0 - 44 U/L 61(H) 65(H) -   Lipid Panel     Component Value Date/Time   CHOL 121 05/27/2020 1022   TRIG 193 (H) 05/27/2020 1022   HDL 47 05/27/2020 1022   LDLCALC 43 05/27/2020 1022   LABVLDL 31 05/27/2020 1022   :   Notable Signs/Symptoms: Dizziness, passing out, headaches, upset stomach, nausea Lifestyle & Dietary Hx Married and lives with her husband. Has been passing out a lot . On leave from work due to passing out. Been diabetic the last couple months. Has been trying to lose weight and trying to do a Keto diet.  Her mother died from massive heart attack at 30. She was a type 1 DM. Her sister died of a massive heart attack at 6.   Estimated daily fluid intake: 40 oz Supplements: Protein shakes Sleep: varies  Stress / self-care: her health and diabetes Current average weekly physical activity: ADL  24-Hr Dietary Recall First Meal: Protein shake, boiled eggs, water Snack: protein bar Second Meal: Sandwich, greens, water Snack: none Third Meal: Salsbury steak, corn, mashed potatoes, water Snack: sour patch kids Beverages: water  Estimated Energy Needs Calories: 1200 Carbohydrate: 135g Protein: 90g Fat: 33g   NUTRITION DIAGNOSIS  NB-1.1 Food and nutrition-related knowledge deficit As related to Diabetes Type 2.  As evidenced by A1C 5.7% on Metformin and Trulicity.   NUTRITION INTERVENTION  Nutrition education (E-1) on the following topics:  Nutrition and Diabetes education provided on My Plate, CHO counting, meal planning, portion sizes, timing of meals, avoiding snacks between meals unless having a low blood sugar, target ranges for A1C and blood sugars, signs/symptoms  and treatment of hyper/hypoglycemia, monitoring blood sugars, taking medications as prescribed, benefits of exercising 30 minutes per day and prevention of complications of DM.   Handouts Provided Include  My Plate Meal Plan Card Diabetes Booklet  Learning Style & Readiness for Change Teaching method utilized: Visual & Auditory  Demonstrated degree of understanding via: Teach Back  Barriers to learning/adherence to lifestyle change: depression  Goals Established by Pt Eat three meals per day at times discussed. Eat 30-45 g of CHO at each meal Drink 80-100 oz of water per day Avoid simple sugars and processed foods Test blood sugars 4 times per day  Ask for referral to Dr. Fransico Him, Endocrinologist for further testing and management of your diabetes. Prevent hypoglycemia. See handout for s/s and treatment. Talk to your doctor about stopping Trulicity for awhile til your appetite gets on track and your blood sugars stabilize.  MONITORING & EVALUATION Dietary intake, weekly physical activity, and blood sugars in 1 week.  Recommend referral to [facility] Endocrinology for further assessment of her diabetes and rule out Type 1. Also recommend referral to cardiology due to family history of heart attacks. Recommend to consider stopping GLP1 for now til her appetite is better and is worked up for reactive hypoglcyemia.  Next Steps  Patient is to work on meal planning, not skipping meals and testing blood sugars.Marland Kitchen

## 2021-03-29 NOTE — Patient Instructions (Addendum)
Goals Established by Pt Eat three meals per day at times discussed. Eat 30-45 g of CHO at each meal Drink 80-100 oz of water per day Avoid simple sugars and processed foods Test blood sugars 4 times per day  Ask for referral to Dr. Fransico Him, Endocrinologist for further testing and management of your diabetes. Prevent hypoglycemia. See handout for s/s and treatment. Recommend referral to cardiology due to family history of heart disease. Talk to your doctor about stopping Trulicity for awhile til your appetite gets on track and your blood sugars stabilize.

## 2021-03-31 ENCOUNTER — Telehealth: Payer: Self-pay

## 2021-03-31 NOTE — Telephone Encounter (Signed)
[name] regarding results and informed her referral was sent

## 2021-03-31 NOTE — Telephone Encounter (Signed)
-----   Message from Sallyanne Kuster, NP sent at 03/22/2021  4:01 AM EST ----- Please call patient and let her know that there is no abnormalities seen on her CT scan that would explain her symptoms. She has no bowel obstruction or ileus. I will order an urgent referral to gastroenterology as I previously discussed with her.

## 2021-04-03 ENCOUNTER — Telehealth: Payer: Self-pay

## 2021-04-04 NOTE — Telephone Encounter (Signed)
She said she has been passing out due to diabetes, so her dietician suggested endocrinology.

## 2021-04-06 ENCOUNTER — Other Ambulatory Visit: Payer: Self-pay

## 2021-04-06 ENCOUNTER — Encounter: Payer: Medicaid Other | Attending: Nurse Practitioner | Admitting: Nutrition

## 2021-04-06 VITALS — Wt 209.0 lb

## 2021-04-06 DIAGNOSIS — E781 Pure hyperglyceridemia: Secondary | ICD-10-CM | POA: Diagnosis not present

## 2021-04-06 DIAGNOSIS — I10 Essential (primary) hypertension: Secondary | ICD-10-CM | POA: Insufficient documentation

## 2021-04-06 DIAGNOSIS — E1165 Type 2 diabetes mellitus with hyperglycemia: Secondary | ICD-10-CM | POA: Diagnosis not present

## 2021-04-06 DIAGNOSIS — Z8742 Personal history of other diseases of the female genital tract: Secondary | ICD-10-CM | POA: Diagnosis not present

## 2021-04-06 NOTE — Progress Notes (Signed)
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 8 Preferred learning style: no preference.  Learning readiness: Ready    NUTRITION ASSESSMENT Follow up for obesity and PCOS and dropping blood sugars. Here with her husband. Currently taking Meformin 1000 mg with breakfast. Encouraged to take it AFTER breakfast or lunch daily and not on an empty stomach.  Changes:  eating more, Hasn't been taking Trulicity due to severe GERD.  Has been taking Prilosec and it has helped her reflux a lot. Willing to restart Trulicity to see how her stomach handles it. Still doesn't have an appetite but is eating three meals per day. Hasn't had any drops in her blood sugars often or passing out at all. She can tell if it drops and catches it early. Has learned that eating meals on time eliminates that usually. Tried selzer water and liked it to replace sodas. BS are doing better.   BS 152 mg in office an hour after eating 1/2 sub.  A1C 5.7%.  Anthropometrics   Has lost down from 232 lbs. Wants to lose weight. Wt Readings from Last 3 Encounters:  03/29/21 210 lb (95.3 kg)  03/20/21 210 lb (95.3 kg)  01/19/21 207 lb 12.8 oz (94.3 kg)   Ht Readings from Last 3 Encounters:  03/29/21 5\' 5"  (1.651 m)  03/20/21 5\' 5"  (1.651 m)  01/19/21 5\' 5"  (1.651 m)   There is no height or weight on file to calculate BMI. @BMIFA @ Facility age limit for growth percentiles is 20 years. Facility age limit for growth percentiles is 20 years.   Clinical Medical Hx: PCOS, Type 2, Depression, Medication and Metformin  XR 1000 mg daily Labs:    Lab Results  Component Value Date   HGBA1C 5.7 (A) 01/19/2021   CMP Latest Ref Rng & Units 09/30/2020 09/03/2020 08/04/2020  Glucose 70 - 99 mg/dL 13/12/2020) 10/02/2020) 09/05/2020)  BUN 6 - 20 mg/dL 11 13 10   Creatinine 0.44 - 1.00 mg/dL 08/06/2020 536(U 440(H  Sodium 135 - 145 mmol/L 138 137  136  Potassium 3.5 - 5.1 mmol/L 3.8 3.5 3.2(L)  Chloride 98 - 111 mmol/L 103 103 104  CO2 22 - 32 mmol/L 27 24 23   Calcium 8.9 - 10.3 mg/dL 474(Q) 8.9 )  Total Protein 6.5 - 8.1 g/dL 7.3 7.0 -  Total Bilirubin 0.3 - 1.2 mg/dL 0.6 0.3 -  Alkaline Phos 38 - 126 U/L 35(L) 37(L) -  AST 15 - 41 U/L 39 55(H) -  ALT 0 - 44 U/L 61(H) 65(H) -   Lipid Panel     Component Value Date/Time   CHOL 121 05/27/2020 1022   TRIG 193 (H) 05/27/2020 1022   HDL 47 05/27/2020 1022   LDLCALC 43 05/27/2020 1022   LABVLDL 31 05/27/2020 1022   :   Notable Signs/Symptoms: Dizziness, passing out, headaches, upset stomach, nausea Lifestyle & Dietary Hx Married and lives with her husband. Has been passing out a lot . On leave from work due to passing out. Been diabetic the last couple months. Has been trying to lose weight and trying to do a Keto diet.  Her mother died from massive heart attack at 16. She was a type 1 DM. Her sister died of a massive heart attack at 59.   Estimated daily fluid intake: 40 oz Supplements: Protein shakes Sleep: varies  Stress / self-care: her health and diabetes  Current average weekly physical activity: ADL  24-Hr Dietary Recall First Meal: Eggs and wheat toast and fruit,  Snack:  Second Meal: veggie sub 6", water Snack: none Third Meal: Salsbury steak, corn, mashed potatoes, water Snack: sour patch kids Beverages: water  Estimated Energy Needs Calories: 1200 Carbohydrate: 135g Protein: 90g Fat: 33g   NUTRITION DIAGNOSIS  NB-1.1 Food and nutrition-related knowledge deficit As related to Diabetes Type 2.  As evidenced by A1C 5.7% on Metformin and Trulicity.   NUTRITION INTERVENTION  Nutrition education (E-1) on the following topics:  Nutrition and Diabetes education provided on My Plate, CHO counting, meal planning, portion sizes, timing of meals, avoiding snacks between meals unless having a low blood sugar, target ranges for A1C and blood sugars,  signs/symptoms and treatment of hyper/hypoglycemia, monitoring blood sugars, taking medications as prescribed, benefits of exercising 30 minutes per day and prevention of complications of DM.   Handouts Provided Include  My Plate Meal Plan Card Diabetes Booklet  Learning Style & Readiness for Change Teaching method utilized: Visual & Auditory  Demonstrated degree of understanding via: Teach Back  Barriers to learning/adherence to lifestyle change: depression  Goals Established by Pt Eat three meals per day at times discussed. Eat 30-45 g of CHO at each meal Drink 80-100 oz of water per day Avoid simple sugars and processed foods Test blood sugars 4 times per day  Prevent hypoglycemia. See handout for s/s and treatment. Go back on Trulicity if stomach is ok.  MONITORING & EVALUATION Dietary intake, weekly physical activity, and blood sugars in 2 months.  Recommend to do antibody testing to rule out Type 1 diabetes. May also consider a gluten free diet to help with stomach issues that she has had for a long time. Recommend referral to GI for possible celiac or other GI issues.  Next Steps  Patient is to work on meal planning, not skipping meals and testing blood sugars.Marland Kitchen

## 2021-04-11 ENCOUNTER — Encounter: Payer: Self-pay | Admitting: Nutrition

## 2021-04-11 NOTE — Patient Instructions (Signed)
°  Goals Established by Pt Keep up the good job!   Eat three meals per day at times discussed. Eat 30-45 g of CHO at each meal Drink 80-100 oz of water per day Avoid simple sugars and processed foods Test blood sugars 4 times per day  Prevent hypoglycemia. See handout for s/s and treatment. Go back on Trulicity if stomach is ok.

## 2021-04-12 ENCOUNTER — Other Ambulatory Visit: Payer: Self-pay

## 2021-04-12 DIAGNOSIS — Z419 Encounter for procedure for purposes other than remedying health state, unspecified: Secondary | ICD-10-CM | POA: Diagnosis not present

## 2021-04-12 DIAGNOSIS — I10 Essential (primary) hypertension: Secondary | ICD-10-CM

## 2021-04-12 MED ORDER — AMLODIPINE BESYLATE 5 MG PO TABS: 5.0000 mg | ORAL_TABLET | Freq: Every day | ORAL | 1 refills | Status: DC

## 2021-04-19 ENCOUNTER — Ambulatory Visit: Payer: Medicaid Other | Admitting: Nurse Practitioner

## 2021-04-19 ENCOUNTER — Other Ambulatory Visit: Payer: Self-pay

## 2021-04-19 ENCOUNTER — Encounter: Payer: Self-pay | Admitting: Nurse Practitioner

## 2021-04-19 VITALS — BP 143/83 | HR 90 | Temp 98.8°F | Resp 16 | Ht 65.0 in | Wt 207.4 lb

## 2021-04-19 DIAGNOSIS — R079 Chest pain, unspecified: Secondary | ICD-10-CM

## 2021-04-19 DIAGNOSIS — R002 Palpitations: Secondary | ICD-10-CM

## 2021-04-19 DIAGNOSIS — E1165 Type 2 diabetes mellitus with hyperglycemia: Secondary | ICD-10-CM | POA: Diagnosis not present

## 2021-04-19 DIAGNOSIS — F331 Major depressive disorder, recurrent, moderate: Secondary | ICD-10-CM | POA: Diagnosis not present

## 2021-04-19 DIAGNOSIS — Z3202 Encounter for pregnancy test, result negative: Secondary | ICD-10-CM | POA: Diagnosis not present

## 2021-04-19 DIAGNOSIS — R42 Dizziness and giddiness: Secondary | ICD-10-CM | POA: Diagnosis not present

## 2021-04-19 LAB — POCT URINALYSIS DIPSTICK
Bilirubin, UA: NEGATIVE
Blood, UA: NEGATIVE
Glucose, UA: NEGATIVE
Ketones, UA: NEGATIVE
Leukocytes, UA: NEGATIVE
Nitrite, UA: NEGATIVE
Protein, UA: NEGATIVE
Spec Grav, UA: 1.025 (ref 1.010–1.025)
Urobilinogen, UA: 0.2 E.U./dL
pH, UA: 6 (ref 5.0–8.0)

## 2021-04-19 LAB — POCT GLYCOSYLATED HEMOGLOBIN (HGB A1C): Hemoglobin A1C: 5.7 % — AB (ref 4.0–5.6)

## 2021-04-19 LAB — POCT URINE PREGNANCY: Preg Test, Ur: NEGATIVE
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
## 2021-04-20 ENCOUNTER — Ambulatory Visit: Payer: Medicaid Other | Admitting: Nurse Practitioner

## 2021-04-20 ENCOUNTER — Other Ambulatory Visit (HOSPITAL_COMMUNITY): Payer: Medicaid Other

## 2021-04-20 ENCOUNTER — Ambulatory Visit (HOSPITAL_COMMUNITY): Payer: Medicaid Other

## 2021-04-21 ENCOUNTER — Telehealth: Payer: Self-pay

## 2021-04-21 NOTE — Telephone Encounter (Signed)
BH referral sent via [facility] °

## 2021-05-01 ENCOUNTER — Other Ambulatory Visit: Payer: Self-pay

## 2021-05-01 ENCOUNTER — Encounter: Payer: Self-pay | Admitting: Nurse Practitioner

## 2021-05-01 ENCOUNTER — Ambulatory Visit: Payer: Medicaid Other | Admitting: Nurse Practitioner

## 2021-05-01 VITALS — BP 132/90 | HR 90 | Temp 98.6°F | Resp 16 | Ht 65.0 in | Wt 205.4 lb

## 2021-05-01 DIAGNOSIS — F4310 Post-traumatic stress disorder, unspecified: Secondary | ICD-10-CM | POA: Diagnosis not present

## 2021-05-01 DIAGNOSIS — F411 Generalized anxiety disorder: Secondary | ICD-10-CM | POA: Diagnosis not present

## 2021-05-01 DIAGNOSIS — E1165 Type 2 diabetes mellitus with hyperglycemia: Secondary | ICD-10-CM | POA: Diagnosis not present

## 2021-05-01 DIAGNOSIS — R42 Dizziness and giddiness: Secondary | ICD-10-CM

## 2021-05-01 DIAGNOSIS — F332 Major depressive disorder, recurrent severe without psychotic features: Secondary | ICD-10-CM

## 2021-05-01 MED ORDER — SERTRALINE HCL 50 MG PO TABS: 50.0000 mg | ORAL_TABLET | Freq: Every day | ORAL | 3 refills | Status: DC
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
## 2021-05-03 ENCOUNTER — Ambulatory Visit: Payer: Medicaid Other | Admitting: Nurse Practitioner

## 2021-05-03 DIAGNOSIS — F341 Dysthymic disorder: Secondary | ICD-10-CM | POA: Diagnosis not present

## 2021-05-08 DIAGNOSIS — F341 Dysthymic disorder: Secondary | ICD-10-CM | POA: Diagnosis not present

## 2021-05-09 ENCOUNTER — Telehealth: Payer: Self-pay

## 2021-05-09 NOTE — Telephone Encounter (Signed)
Completed medical record request and faxed requesting records to [facility] at [phone].

## 2021-05-10 ENCOUNTER — Other Ambulatory Visit: Payer: Self-pay

## 2021-05-10 DIAGNOSIS — Z419 Encounter for procedure for purposes other than remedying health state, unspecified: Secondary | ICD-10-CM | POA: Diagnosis not present

## 2021-05-10 DIAGNOSIS — E1165 Type 2 diabetes mellitus with hyperglycemia: Secondary | ICD-10-CM

## 2021-05-10 MED ORDER — DULAGLUTIDE 1.5 MG/0.5ML ~~LOC~~ SOAJ: 1.5000 mg | SUBCUTANEOUS | 1 refills | Status: DC

## 2021-05-10 MED ORDER — SERTRALINE HCL 50 MG PO TABS: 50.0000 mg | ORAL_TABLET | Freq: Every day | ORAL | 3 refills | Status: DC

## 2021-05-14 ENCOUNTER — Encounter: Payer: Self-pay | Admitting: Nurse Practitioner

## 2021-05-15 ENCOUNTER — Ambulatory Visit: Payer: Medicaid Other | Admitting: Obstetrics and Gynecology

## 2021-05-15 ENCOUNTER — Encounter: Payer: Self-pay | Admitting: Nurse Practitioner

## 2021-05-15 DIAGNOSIS — F341 Dysthymic disorder: Secondary | ICD-10-CM | POA: Diagnosis not present

## 2021-05-15 NOTE — Progress Notes (Deleted)
? ? ?Sallyanne Kuster, NP ? ? ?No chief complaint on file. ? ? ?HPI: ?     [name] is a 31 y.o. [id number] whose LMP was No LMP recorded., presents today for *** ? ?Pt has PCOS and has ben trying for pregnancy for several years ? ?Patient Active Problem List  ? Diagnosis Date Noted  ? Uncontrolled type 2 diabetes mellitus with hyperglycemia (HCC) 11/22/2020  ? Dizziness 06/06/2019  ? Personal history of COVID-19 Mar 28, 2019  ? Essential hypertension 03/28/2019  ? Shortness of breath 28-Mar-2019  ? Family history of sudden cardiac death in mother March 28, 2019  ? Heart murmur 2019-03-28  ? Screening for hyperlipidemia 03/28/19  ? Diabetes mellitus type 2, controlled, without complications (HCC) 08/12/2018  ? Neck swelling 08/12/2018  ? Pain, dental 08/12/2018  ? PCOS (polycystic ovarian syndrome) 08/12/2018  ? Sepsis (HCC) 08/12/2018  ? 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 Anxiety and depression 08/23/2016  ? 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  ? Moderate episode of recurrent major depressive disorder (HCC) 08/21/2016  ? Insulin resistance 02/24/2016  ? Hypertriglyceridemia 02/24/2016  ? History of PCOS 07/23/2013  ? ? ?Past Surgical History:  ?Procedure Laterality Date  ? NO PAST SURGERIES    ? ? ?Family History  ?Problem Relation Age of Onset  ? Diabetes Mother   ? High blood pressure Mother   ? Heart disease Mother   ? High blood pressure Father   ? Cerebral palsy Father   ? Alzheimer's disease Maternal Grandmother   ? High blood pressure Maternal Grandmother   ? Stroke Maternal Grandmother   ? Alzheimer's disease Maternal Grandfather   ? High blood pressure Maternal Grandfather   ? Heart disease Maternal Grandfather   ? Colon cancer Maternal Uncle   ? Appendicitis Daughter   ? ? ?Social History  ? ?Socioeconomic History  ? Marital status: Unknown  ?  Spouse name: Not on file  ? Number of children: Not on  file  ? Years of education: Not on file  ? Highest education level: Not on file  ?Occupational History  ? Not on file  ?Tobacco Use  ? Smoking status: Never  ? Smokeless tobacco: Never  ?Vaping Use  ? Vaping Use: Never used  ?Substance and Sexual Activity  ? Alcohol use: Never  ? Drug use: Never  ? Sexual activity: Yes  ?  Birth control/protection: None  ?Other Topics Concern  ? Not on file  ?Social History Narrative  ? Not on file  ? ?Social Determinants of Health  ? ?Financial Resource Strain: High Risk  ? Difficulty of Paying Living Expenses: Hard  ?Food Insecurity: No Food Insecurity  ? Worried About Programme researcher, broadcasting/film/video in the Last Year: Never true  ? Ran Out of Food in the Last Year: Never true  ?Transportation Needs: No Transportation Needs  ? Lack of Transportation (Medical): No  ? Lack of Transportation (Non-Medical): No  ?Physical Activity: Not on file  ?Stress: Stress Concern Present  ? Feeling of Stress : Very much  ?Social Connections: Not on file  ?Intimate Partner Violence: Not on file  ? ? ?Outpatient Medications Prior to Visit  ?Medication Sig Dispense Refill  ? amLODipine (NORVASC) 5 MG tablet Take 1 tablet (5 mg total) by mouth daily. 90 tablet 1  ? Dulaglutide 1.5 MG/0.5ML SOPN Inject 1.5 mg into the skin once a week. 6 mL 1  ? famotidine (PEPCID) 20  MG tablet Take 20 mg by mouth 2 (two) times daily.    ? metFORMIN (GLUCOPHAGE-XR) 500 MG 24 hr tablet Take 2 tablets (1,000 mg total) by mouth daily with breakfast. 180 tablet 1  ? ondansetron (ZOFRAN ODT) 4 MG disintegrating tablet Take 1 tablet (4 mg total) by mouth every 8 (eight) hours as needed for nausea or vomiting. 12 tablet 0  ? Probiotic Product (PROBIOTIC PO) Take 1 tablet by mouth daily.    ? sertraline (ZOLOFT) 50 MG tablet Take 1 tablet (50 mg total) by mouth daily. May take 1/2 tablet daily for the first week then increase to 1 tablet daily. 30 tablet 3  ? spironolactone (ALDACTONE) 25 MG tablet Take 1 tablet (25 mg total) by mouth  daily. 30 tablet 3  ? ?No facility-administered medications prior to visit.  ? ? ? ? ?ROS: ? ?Review of Systems ?BREAST: No symptoms ? ? ?OBJECTIVE:  ? ?Vitals:  ?There were no vitals taken for this visit. ? ?Physical Exam ? ?Results: ?No results found for this or any previous visit (from the past 24 hour(s)). ? ? ?Assessment/Plan: ?No diagnosis found. ? ? ? ?No orders of the defined types were placed in this encounter. ? ? ? ? No follow-ups on file. ? ?Jahkari Maclin B. Ryin Ambrosius, PA-C ?05/15/2021 ?9:09 AM ? ? ? ? ? ?

## 2021-05-29 ENCOUNTER — Emergency Department (HOSPITAL_COMMUNITY)
Admission: EM | Admit: 2021-05-29 | Discharge: 2021-05-29 | Discharge status: Against Medical Advice | Payer: Medicaid Other

## 2021-05-29 ENCOUNTER — Ambulatory Visit: Payer: Medicaid Other | Admitting: Nurse Practitioner

## 2021-05-29 DIAGNOSIS — F419 Anxiety disorder, unspecified: Secondary | ICD-10-CM | POA: Diagnosis not present

## 2021-05-29 NOTE — ED Notes (Signed)
Pt called for triage, no answer

## 2021-05-29 NOTE — ED Notes (Signed)
Called for triage with no answer x1 

## 2021-05-31 ENCOUNTER — Other Ambulatory Visit: Payer: Medicaid Other | Admitting: Nurse Practitioner

## 2021-06-05 ENCOUNTER — Telehealth: Payer: Self-pay

## 2021-06-05 NOTE — Telephone Encounter (Signed)
Left vm to confirm 06/12/21 appointment-Toni ?

## 2021-06-06 ENCOUNTER — Ambulatory Visit: Payer: Medicaid Other | Admitting: Nutrition

## 2021-06-06 ENCOUNTER — Telehealth: Payer: Self-pay | Admitting: Nutrition

## 2021-06-06 NOTE — Telephone Encounter (Signed)
No show. TC to her and she said she forgot and will call back and r/s after she gets her work schedule. ? ?

## 2021-06-06 NOTE — Telephone Encounter (Signed)
[name] to call us back 

## 2021-06-07 ENCOUNTER — Other Ambulatory Visit: Payer: Self-pay

## 2021-06-07 ENCOUNTER — Ambulatory Visit
Admission: EM | Admit: 2021-06-07 | Discharge: 2021-06-07 | Disposition: A | Discharge status: Home / Self Care | Payer: Medicaid Other | Attending: Family Medicine | Admitting: Family Medicine

## 2021-06-07 DIAGNOSIS — H65192 Other acute nonsuppurative otitis media, left ear: Secondary | ICD-10-CM

## 2021-06-07 DIAGNOSIS — E1165 Type 2 diabetes mellitus with hyperglycemia: Secondary | ICD-10-CM

## 2021-06-07 MED ORDER — METFORMIN HCL ER 500 MG PO TB24: 1000.0000 mg | ORAL_TABLET | Freq: Every day | ORAL | 1 refills | Status: DC

## 2021-06-07 MED ORDER — PSEUDOEPHEDRINE HCL ER 120 MG PO TB12
120.0000 mg | ORAL_TABLET | Freq: Two times a day (BID) | ORAL | 0 refills | Status: DC | PRN
Start: 2021-06-07 — End: 2021-07-02

## 2021-06-07 MED ORDER — FLUTICASONE PROPIONATE 50 MCG/ACT NA SUSP: 1.0000 | Freq: Two times a day (BID) | NASAL | 1 refills | Status: DC
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
## 2021-06-07 NOTE — ED Triage Notes (Signed)
Pt states her left ear started hurting about 3 or 4 days ago  ? ?Denies Meds ?

## 2021-06-07 NOTE — Telephone Encounter (Signed)
Spoke pt she taking in amlodipine at bedtime its help ?

## 2021-06-10 DIAGNOSIS — Z419 Encounter for procedure for purposes other than remedying health state, unspecified: Secondary | ICD-10-CM | POA: Diagnosis not present

## 2021-06-12 ENCOUNTER — Other Ambulatory Visit: Payer: Medicaid Other | Admitting: Nurse Practitioner

## 2021-06-13 DIAGNOSIS — F341 Dysthymic disorder: Secondary | ICD-10-CM | POA: Diagnosis not present

## 2021-06-14 ENCOUNTER — Telehealth: Payer: Self-pay

## 2021-06-14 MED ORDER — EPINEPHRINE 0.3 MG/0.3ML IJ SOAJ: 0.3000 mg | INTRAMUSCULAR | 2 refills | Status: DC | PRN

## 2021-06-14 NOTE — Telephone Encounter (Signed)
Spoke to pt and informed her med was sent  

## 2021-06-15 ENCOUNTER — Ambulatory Visit (INDEPENDENT_AMBULATORY_CARE_PROVIDER_SITE_OTHER): Payer: Medicaid Other

## 2021-06-15 ENCOUNTER — Ambulatory Visit
Admission: EM | Admit: 2021-06-15 | Discharge: 2021-06-15 | Disposition: A | Discharge status: Home / Self Care | Payer: Medicaid Other | Attending: Urgent Care | Admitting: Urgent Care

## 2021-06-15 DIAGNOSIS — S93401A Sprain of unspecified ligament of right ankle, initial encounter: Secondary | ICD-10-CM

## 2021-06-15 DIAGNOSIS — M25571 Pain in right ankle and joints of right foot: Secondary | ICD-10-CM

## 2021-06-15 DIAGNOSIS — M7989 Other specified soft tissue disorders: Secondary | ICD-10-CM | POA: Diagnosis not present

## 2021-06-15 MED ORDER — NAPROXEN 375 MG PO TABS: 375.0000 mg | ORAL_TABLET | Freq: Two times a day (BID) | ORAL | 0 refills | Status: DC

## 2021-06-15 NOTE — ED Triage Notes (Signed)
Pt reports swelling in right ankle x 1 day. States she was walking and the right ankle went into a hole.  ?
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
## 2021-06-15 NOTE — ED Notes (Signed)
Pt states she is not sexually active, she do not want pregnancy test.  ?

## 2021-06-23 ENCOUNTER — Emergency Department (HOSPITAL_COMMUNITY)
Admission: EM | Admit: 2021-06-23 | Discharge: 2021-06-23 | Discharge status: Against Medical Advice | Payer: Medicaid Other

## 2021-06-23 NOTE — ED Notes (Signed)
Called for Pt X 1 from waiting room. No response ?

## 2021-06-27 DIAGNOSIS — F341 Dysthymic disorder: Secondary | ICD-10-CM | POA: Diagnosis not present

## 2021-06-28 DIAGNOSIS — H5213 Myopia, bilateral: Secondary | ICD-10-CM | POA: Diagnosis not present

## 2021-07-02 ENCOUNTER — Encounter (HOSPITAL_COMMUNITY): Payer: Self-pay | Admitting: Emergency Medicine

## 2021-07-02 ENCOUNTER — Emergency Department (HOSPITAL_COMMUNITY)
Admission: EM | Admit: 2021-07-02 | Discharge: 2021-07-02 | Disposition: A | Discharge status: Home / Self Care | Payer: Medicaid Other | Attending: Emergency Medicine | Admitting: Emergency Medicine

## 2021-07-02 ENCOUNTER — Other Ambulatory Visit: Payer: Self-pay

## 2021-07-02 DIAGNOSIS — R739 Hyperglycemia, unspecified: Secondary | ICD-10-CM | POA: Insufficient documentation

## 2021-07-02 DIAGNOSIS — E1165 Type 2 diabetes mellitus with hyperglycemia: Secondary | ICD-10-CM | POA: Diagnosis not present

## 2021-07-02 DIAGNOSIS — Z79899 Other long term (current) drug therapy: Secondary | ICD-10-CM | POA: Diagnosis not present

## 2021-07-02 DIAGNOSIS — E860 Dehydration: Secondary | ICD-10-CM | POA: Diagnosis not present

## 2021-07-02 DIAGNOSIS — Z7984 Long term (current) use of oral hypoglycemic drugs: Secondary | ICD-10-CM | POA: Diagnosis not present

## 2021-07-02 DIAGNOSIS — R1114 Bilious vomiting: Secondary | ICD-10-CM

## 2021-07-02 DIAGNOSIS — I10 Essential (primary) hypertension: Secondary | ICD-10-CM | POA: Insufficient documentation

## 2021-07-02 DIAGNOSIS — R112 Nausea with vomiting, unspecified: Secondary | ICD-10-CM | POA: Diagnosis not present

## 2021-07-02 LAB — URINALYSIS, ROUTINE W REFLEX MICROSCOPIC
Bilirubin Urine: NEGATIVE
Glucose, UA: NEGATIVE mg/dL
Hgb urine dipstick: NEGATIVE
Ketones, ur: NEGATIVE mg/dL
Leukocytes,Ua: NEGATIVE
Nitrite: NEGATIVE
Protein, ur: NEGATIVE mg/dL
Specific Gravity, Urine: 1.013 (ref 1.005–1.030)
pH: 8 (ref 5.0–8.0)

## 2021-07-02 LAB — COMPREHENSIVE METABOLIC PANEL
ALT: 36 U/L (ref 0–44)
AST: 28 U/L (ref 15–41)
Albumin: 3.8 g/dL (ref 3.5–5.0)
Alkaline Phosphatase: 33 U/L — ABNORMAL LOW (ref 38–126)
Anion gap: 7 (ref 5–15)
BUN: 12 mg/dL (ref 6–20)
CO2: 25 mmol/L (ref 22–32)
Calcium: 8.9 mg/dL (ref 8.9–10.3)
Chloride: 106 mmol/L (ref 98–111)
Creatinine, Ser: 0.65 mg/dL (ref 0.44–1.00)
GFR, Estimated: >60 mL/min (ref >60)
Glucose, Bld: 95 mg/dL (ref 70–99)
Potassium: 4 mmol/L (ref 3.5–5.1)
Sodium: 138 mmol/L (ref 135–145)
Total Bilirubin: 0.3 mg/dL (ref 0.3–1.2)
Total Protein: 7.4 g/dL (ref 6.5–8.1)

## 2021-07-02 LAB — CBC WITH DIFFERENTIAL/PLATELET
Abs Immature Granulocytes: 0.04 10*3/uL (ref 0.00–0.07)
Basophils Absolute: 0.1 10*3/uL (ref 0.0–0.1)
Basophils Relative: 1 %
Eosinophils Absolute: 0.4 10*3/uL (ref 0.0–0.5)
Eosinophils Relative: 5 %
HCT: 39.8 % (ref 36.0–46.0)
Hemoglobin: 13.1 g/dL (ref 12.0–15.0)
Immature Granulocytes: 1 %
Lymphocytes Relative: 44 %
Lymphs Abs: 3.2 10*3/uL (ref 0.7–4.0)
MCH: 27.7 pg (ref 26.0–34.0)
MCHC: 32.9 g/dL (ref 30.0–36.0)
MCV: 84.1 fL (ref 80.0–100.0)
Monocytes Absolute: 0.6 10*3/uL (ref 0.1–1.0)
Monocytes Relative: 8 %
Neutro Abs: 3 10*3/uL (ref 1.7–7.7)
Neutrophils Relative %: 41 %
Platelets: 393 10*3/uL (ref 150–400)
RBC: 4.73 MIL/uL (ref 3.87–5.11)
RDW: 12.5 % (ref 11.5–15.5)
WBC: 7.2 10*3/uL (ref 4.0–10.5)
nRBC: 0 % (ref 0.0–0.2)

## 2021-07-02 LAB — CBG MONITORING, ED
Glucose-Capillary: 100 mg/dL — ABNORMAL HIGH (ref 70–99)
Glucose-Capillary: 112 mg/dL — ABNORMAL HIGH (ref 70–99)

## 2021-07-02 LAB — PREGNANCY, URINE: Preg Test, Ur: NEGATIVE

## 2021-07-02 MED ORDER — SODIUM CHLORIDE 0.9 % IV BOLUS
1000.0000 mL | Freq: Once | INTRAVENOUS | Status: AC
  Administered 2021-07-02: 1000 mL via INTRAVENOUS

## 2021-07-02 MED ORDER — ONDANSETRON HCL 4 MG/2ML IJ SOLN
4.0000 mg | Freq: Once | INTRAMUSCULAR | Status: AC
  Administered 2021-07-02: 4 mg via INTRAVENOUS
  Filled 2021-07-02: qty 2

## 2021-07-02 MED ORDER — ONDANSETRON 4 MG PO TBDP: 4.0000 mg | ORAL_TABLET | Freq: Three times a day (TID) | ORAL | 0 refills | Status: DC | PRN

## 2021-07-02 NOTE — ED Provider Notes (Signed)
?[place] ?Provider Note ? ? ?CSN: [id number] ?Arrival date & time: 07/02/21  1316 ? ?  ? ?History ? ?Chief Complaint  ?Patient presents with  ? Hyperglycemia  ? ? ?Monique Forbes is a 31 y.o. female. ? ? ?Hyperglycemia ? ?This patient is a 31 year old female with a history of hypertension on Norvasc, she is on Trulicity and metformin and has been since August.  The patient reports to me that she has had a couple of days of not feeling well, this started with a fever of 101 degrees 2 days ago followed by the onset of vomiting and diarrhea which has been present for the most part of the last 36 hours.  She was not feeling well at 3:00 in the morning when her husband checked her blood sugar and it was over 500, she has been taking her medications but has had nothing to eat or drink today because of her nausea and vomiting.  She was encouraged to come to the emergency department instead of going to work because of the way that she was feeling.  She denies coughing or shortness of breath and has no pain, no headache, no neck or back pain, no chest pain or belly pain and no swelling or rashes to the legs.  She has been excessively thirsty but because of nausea and vomiting has not been able to eat or drink anything. ? ?She readily endorses that with her new job and with her lifestyle she does not check her blood sugar frequently ? ?Home Medications ?Prior to Admission medications   ?Medication Sig Start Date End Date Taking? Authorizing Provider  ?amLODipine (NORVASC) 5 MG tablet Take 1 tablet (5 mg total) by mouth daily. 04/12/21  Yes Sallyanne Kuster, NP  ?Cholecalciferol (VITAMIN D3) 125 MCG (5000 UT) CAPS Take 1 capsule by mouth daily.   Yes [provider]  ?Dulaglutide 1.5 MG/0.5ML SOPN Inject 1.5 mg into the skin once a week. 05/10/21  Yes Abernathy, Arlyss Repress, NP  ?EPINEPHrine (EPIPEN 2-PAK) 0.3 mg/0.3 mL IJ SOAJ injection Inject 0.3 mg into the muscle as needed for anaphylaxis. 06/14/21   Yes Abernathy, Arlyss Repress, NP  ?famotidine (PEPCID) 20 MG tablet Take 20 mg by mouth daily. 03/16/21  Yes [provider]  ?fluticasone (FLONASE) 50 MCG/ACT nasal spray Place 1 spray into both nostrils 2 (two) times daily. 06/07/21  Yes Particia Nearing, PA-C  ?metFORMIN (GLUCOPHAGE-XR) 500 MG 24 hr tablet Take 2 tablets (1,000 mg total) by mouth daily with breakfast. 06/07/21  Yes Sallyanne Kuster, NP  ?Probiotic Product (PROBIOTIC PO) Take 1 tablet by mouth daily.   Yes [provider]  ?sertraline (ZOLOFT) 50 MG tablet Take 1 tablet (50 mg total) by mouth daily. May take 1/2 tablet daily for the first week then increase to 1 tablet daily. 05/10/21  Yes Abernathy, Arlyss Repress, NP  ?spironolactone (ALDACTONE) 25 MG tablet Take 1 tablet (25 mg total) by mouth daily. 03/17/21  Yes Abernathy, Arlyss Repress, NP  ?ondansetron (ZOFRAN ODT) 4 MG disintegrating tablet Take 1 tablet (4 mg total) by mouth every 8 (eight) hours as needed for nausea or vomiting. 07/02/21   Eber Hong, MD  ?levocetirizine (XYZAL) 5 MG tablet Take 1 tablet (5 mg total) by mouth every evening. ?Patient not taking: No sig reported 07/31/20 08/04/20  Bing Neighbors, FNP  ?   ? ?Allergies    ?Bee venom and Flavoring agent   ? ?Review of Systems   ?Review of Systems  ?All other systems  reviewed and are negative. ? ?Physical Exam ?Updated Vital Signs ?BP 134/82 (BP Location: Right Arm)   Pulse 89   Temp 98.3 ?F (36.8 ?C) (Oral)   Resp 18   Ht 1.651 m (5\' 5" )   Wt 93.4 kg   LMP 04/26/2021   SpO2 99%   BMI 34.28 kg/m?  ?Physical Exam ?Vitals and nursing note reviewed.  ?Constitutional:   ?   General: She is not in acute distress. ?   Appearance: She is well-developed.  ?HENT:  ?   Head: Normocephalic and atraumatic.  ?   Mouth/Throat:  ?   Pharynx: No oropharyngeal exudate.  ?Eyes:  ?   General: No scleral icterus.    ?   Right eye: No discharge.     ?   Left eye: No discharge.  ?   Conjunctiva/sclera: Conjunctivae normal.  ?   Pupils:  Pupils are equal, round, and reactive to light.  ?Neck:  ?   Thyroid: No thyromegaly.  ?   Vascular: No JVD.  ?Cardiovascular:  ?   Rate and Rhythm: Normal rate and regular rhythm.  ?   Heart sounds: Normal heart sounds. No murmur heard. ?  No friction rub. No gallop.  ?Pulmonary:  ?   Effort: Pulmonary effort is normal. No respiratory distress.  ?   Breath sounds: Normal breath sounds. No wheezing or rales.  ?Abdominal:  ?   General: Bowel sounds are normal. There is no distension.  ?   Palpations: Abdomen is soft. There is no mass.  ?   Tenderness: There is no abdominal tenderness.  ?Musculoskeletal:     ?   General: No tenderness. Normal range of motion.  ?   Cervical back: Normal range of motion and neck supple.  ?   Right lower leg: No edema.  ?   Left lower leg: No edema.  ?Lymphadenopathy:  ?   Cervical: No cervical adenopathy.  ?Skin: ?   General: Skin is warm and dry.  ?   Findings: No erythema or rash.  ?Neurological:  ?   Mental Status: She is alert.  ?   Coordination: Coordination normal.  ?Psychiatric:     ?   Behavior: Behavior normal.  ? ? ?ED Results / Procedures / Treatments   ?Labs ?(all labs ordered are listed, but only abnormal results are displayed) ?Labs Reviewed  ?COMPREHENSIVE METABOLIC PANEL - Abnormal; Notable for the following components:  ?    Result Value  ? Alkaline Phosphatase 33 (*)   ? All other components within normal limits  ?URINALYSIS, ROUTINE W REFLEX MICROSCOPIC - Abnormal; Notable for the following components:  ? Color, Urine STRAW (*)   ? All other components within normal limits  ?CBG MONITORING, ED - Abnormal; Notable for the following components:  ? Glucose-Capillary 112 (*)   ? All other components within normal limits  ?CBG MONITORING, ED - Abnormal; Notable for the following components:  ? Glucose-Capillary 100 (*)   ? All other components within normal limits  ?URINE CULTURE  ?CBC WITH DIFFERENTIAL/PLATELET  ?PREGNANCY, URINE  ?CBG MONITORING, ED   ? ? ?EKG ?None ? ?Radiology ?No results found. ? ?Procedures ?Procedures  ? ? ?Medications Ordered in ED ?Medications  ?sodium chloride 0.9 % bolus 1,000 mL (1,000 mLs Intravenous New Bag/Given 07/02/21 1409)  ?ondansetron (ZOFRAN) injection 4 mg (4 mg Intravenous Given 07/02/21 1409)  ? ? ?ED Course/ Medical Decision Making/ A&P ?  ?                        ?  Medical Decision Making ?Amount and/or Complexity of Data Reviewed ?Labs: ordered. ? ?Risk ?Prescription drug management. ? ? ?This patient presents to the ED for concern of hyperglycemia, this involves an extensive number of treatment options, and is a complaint that carries with it a high risk of complications and morbidity.  The differential diagnosis includes infection, medication noncompliance, gastroenteritis ? ? ?Co morbidities that complicate the patient evaluation ? ?Diabetes, high body mass index, hypertension ? ? ?Additional history obtained: ? ?Additional history obtained from electronic medical record ?External records from outside source obtained and reviewed including prior nutrition visits, she has seen a nutritionist because of obesity and PCOS, she has seen her pulmonologist because of asthma, she has been seen for palpitations and persistent uncontrolled type 2 diabetes.  The patient is not on insulin ? ? ?Lab Tests: ? ?I Ordered, and personally interpreted labs.  The pertinent results include: Normal CBC and CMP, urinalysis without infection ? ? ?Cardiac Monitoring: / EKG: ? ?The patient was maintained on a cardiac monitor.  I personally viewed and interpreted the cardiac monitored which showed an underlying rhythm of: Normal sinus rhythm, no arrhythmias ? ? ?Consultations Obtained: ? ?None ? ? ?Problem List / ED Course / Critical interventions / Medication management ? ?Rule out DKA, patient is well-appearing, she is tolerating fluids, she is not hypo or significantly hyperglycemic and her labs are reassuring, she already has Zofran at  home ?I ordered medication including fluids for dehydration ?Reevaluation of the patient after these medicines showed that the patient improved ?I have reviewed the patients home medicines and have made adjustments as needed ? ? ?Social DTommi Rumps

## 2021-07-02 NOTE — Discharge Instructions (Addendum)
Your testing has been reassuring, there is no signs of significant elevation in blood sugar.  You may use the Zofran every 6 hours as needed for nausea, I sent a refill to your pharmacy.  Please drink plenty of clear liquids and stay out of work today. ? ?Thank you for letting us take care of you today! ? ?Please obtain all of your results from medical records or have your doctors office obtain the results - share them with your doctor - you should be seen at your doctors office in the next 2 days. Call today to arrange your follow up. Take the medications as prescribed. Please review all of the medicines and only take them if you do not have an allergy to them. Please be aware that if you are taking birth control pills, taking other prescriptions, ESPECIALLY ANTIBIOTICS may make the birth control ineffective - if this is the case, either do not engage in sexual activity or use alternative methods of birth control such as condoms until you have finished the medicine and your family doctor says it is OK to restart them. If you are on a blood thinner such as COUMADIN, be aware that any other medicine that you take may cause the coumadin to either work too much, or not enough - you should have your coumadin level rechecked in next 7 days if this is the case.  ??  ?It is also a possibility that you have an allergic reaction to any of the medicines that you have been prescribed - Everybody reacts differently to medications and while MOST people have no trouble with most medicines, you may have a reaction such as nausea, vomiting, rash, swelling, shortness of breath. If this is the case, please stop taking the medicine immediately and contact your physician.  ? ?If you were given a medication in the ED such as percocet, vicodin, or morphine, be aware that these medicines are sedating and may change your ability to take care of yourself adequately for several hours after being given this medicines - you should not drive or  take care of small children if you were given this medicine in the Emergency Department or if you have been prescribed these types of medicines. ??  ? You should return to the ER IMMEDIATELY if you develop severe or worsening symptoms.  ? ?

## 2021-07-02 NOTE — ED Triage Notes (Signed)
Patient's blood sugar checked x2 per EDP, Dr Hyacinth Meeker, instruction. Blood sugar 112 to 100. Patient reports blood sugar was 50 x4 days ago. EDP aware. Patient given Cola per EDP's instruction.  ?

## 2021-07-02 NOTE — ED Triage Notes (Signed)
Patient c/o hyperglycemia. Per patient checked blood sugar prior to coming to ED, meter read over 500. Per patient nausea, vomiting, and diarrhea x3 days with a fever of 101 this morning at 3am. Per patient took ibuprofen and fever relieved. Patient diagnosed with diabetes 8 months ago and takes metformin and Trulicity. Dr Sabra Heck in room to assess patient.  ?

## 2021-07-03 ENCOUNTER — Telehealth: Payer: Self-pay

## 2021-07-03 NOTE — Telephone Encounter (Signed)
Transition Care Management Unsuccessful Follow-up Telephone Call ? ?Date of discharge and from where:  07/02/2021 from [place] ? ?Attempts:  1st Attempt ? ?Reason for unsuccessful TCM follow-up call:  Left voice message ? ? ? ?

## 2021-07-04 LAB — URINE CULTURE

## 2021-07-04 NOTE — Telephone Encounter (Signed)
Transition Care Management Unsuccessful Follow-up Telephone Call ? ?Date of discharge and from where:   07/02/2021 from [facility] ? ?Attempts:  2nd Attempt ? ?Reason for unsuccessful TCM follow-up call:  Unable to leave message ? ? ? ?

## 2021-07-05 NOTE — Telephone Encounter (Signed)
Transition Care Management Follow-up Telephone Call ?Date of discharge and from where: 07/02/2021-[place]  ?How have you been since you were released from the hospital? Pt stated she is still vomiting. Patient was encouraged to follow up with PCP as soon as possible.  ?Any questions or concerns? No ? ?Items Reviewed: ?Did the pt receive and understand the discharge instructions provided? Yes  ?Medications obtained and verified? Yes  ?Other? No  ?Any new allergies since your discharge? No  ?Dietary orders reviewed? No ?Do you have support at home? Yes  ? ?Home Care and Equipment/Supplies: ?Were home health services ordered? not applicable ?If so, what is the name of the agency? N/A  ?Has the agency set up a time to come to the patient's home? not applicable ?Were any new equipment or medical supplies ordered?  No ?What is the name of the medical supply agency? N/A ?Were you able to get the supplies/equipment? not applicable ?Do you have any questions related to the use of the equipment or supplies? No ? ?Functional Questionnaire: (I = Independent and D = Dependent) ?ADLs: I ? ?Bathing/Dressing- I ? ?Meal Prep- I ? ?Eating- I ? ?Maintaining continence- I ? ?Transferring/Ambulation- I ? ?Managing Meds- I ? ?Follow up appointments reviewed: ? ?PCP Hospital f/u appt confirmed? No   ?Specialist Hospital f/u appt confirmed? No   ?Are transportation arrangements needed? No  ?If their condition worsens, is the pt aware to call PCP or go to the Emergency Dept.? Yes ?Was the patient provided with contact information for the PCP's office or ED? Yes ?Was to pt encouraged to call back with questions or concerns? Yes  ?

## 2021-07-07 ENCOUNTER — Telehealth: Payer: Self-pay

## 2021-07-07 NOTE — Telephone Encounter (Signed)
Pt called and advised she went to ER 07/02/21 and they did a UA/Culture on her.  The results showed that specimen needed to be recollected and pt was asking about that.  Pt advised she wasn't having any symptoms but frequency and states she feels that is just bc of her being a diabetic.  I explained that it can be like that if sugars are elevated.  Pt advised she has had problems recently with her sugar readings being all over the place.  I informed pt that if she feels she is having symptoms of a UTI to call us back and make an appointment.  Also I mailed patient a Diabetic meal planning guide. ?

## 2021-07-10 DIAGNOSIS — Z419 Encounter for procedure for purposes other than remedying health state, unspecified: Secondary | ICD-10-CM | POA: Diagnosis not present

## 2021-07-13 DIAGNOSIS — F341 Dysthymic disorder: Secondary | ICD-10-CM | POA: Diagnosis not present

## 2021-07-18 DIAGNOSIS — F341 Dysthymic disorder: Secondary | ICD-10-CM | POA: Diagnosis not present

## 2021-07-20 ENCOUNTER — Other Ambulatory Visit: Payer: Self-pay

## 2021-07-20 DIAGNOSIS — E282 Polycystic ovarian syndrome: Secondary | ICD-10-CM

## 2021-07-20 MED ORDER — SPIRONOLACTONE 25 MG PO TABS: 25.0000 mg | ORAL_TABLET | Freq: Every day | ORAL | 0 refills | Status: DC

## 2021-07-24 ENCOUNTER — Emergency Department (HOSPITAL_COMMUNITY)
Admission: EM | Admit: 2021-07-24 | Discharge: 2021-07-24 | Discharge status: Against Medical Advice | Payer: Medicaid Other | Attending: Emergency Medicine | Admitting: Emergency Medicine

## 2021-07-24 ENCOUNTER — Encounter (HOSPITAL_COMMUNITY): Payer: Self-pay

## 2021-07-24 ENCOUNTER — Emergency Department (HOSPITAL_COMMUNITY): Payer: Medicaid Other

## 2021-07-24 ENCOUNTER — Other Ambulatory Visit: Payer: Self-pay

## 2021-07-24 DIAGNOSIS — R0789 Other chest pain: Secondary | ICD-10-CM | POA: Diagnosis not present

## 2021-07-24 DIAGNOSIS — R079 Chest pain, unspecified: Secondary | ICD-10-CM | POA: Diagnosis not present

## 2021-07-24 DIAGNOSIS — Z5321 Procedure and treatment not carried out due to patient leaving prior to being seen by health care provider: Secondary | ICD-10-CM | POA: Insufficient documentation

## 2021-07-24 LAB — COMPREHENSIVE METABOLIC PANEL
ALT: 32 U/L (ref 0–44)
AST: 28 U/L (ref 15–41)
Albumin: 3.9 g/dL (ref 3.5–5.0)
Alkaline Phosphatase: 28 U/L — ABNORMAL LOW (ref 38–126)
Anion gap: 8 (ref 5–15)
BUN: 8 mg/dL (ref 6–20)
CO2: 23 mmol/L (ref 22–32)
Calcium: 9.3 mg/dL (ref 8.9–10.3)
Chloride: 106 mmol/L (ref 98–111)
Creatinine, Ser: 0.64 mg/dL (ref 0.44–1.00)
GFR, Estimated: >60 mL/min (ref >60)
Glucose, Bld: 135 mg/dL — ABNORMAL HIGH (ref 70–99)
Potassium: 3.7 mmol/L (ref 3.5–5.1)
Sodium: 137 mmol/L (ref 135–145)
Total Bilirubin: 0.8 mg/dL (ref 0.3–1.2)
Total Protein: 7.2 g/dL (ref 6.5–8.1)

## 2021-07-24 LAB — CBC WITH DIFFERENTIAL/PLATELET
Abs Immature Granulocytes: 0.05 10*3/uL (ref 0.00–0.07)
Basophils Absolute: 0.1 10*3/uL (ref 0.0–0.1)
Basophils Relative: 1 %
Eosinophils Absolute: 0.4 10*3/uL (ref 0.0–0.5)
Eosinophils Relative: 3 %
HCT: 41.5 % (ref 36.0–46.0)
Hemoglobin: 13.3 g/dL (ref 12.0–15.0)
Immature Granulocytes: 1 %
Lymphocytes Relative: 29 %
Lymphs Abs: 3 10*3/uL (ref 0.7–4.0)
MCH: 27.3 pg (ref 26.0–34.0)
MCHC: 32 g/dL (ref 30.0–36.0)
MCV: 85.2 fL (ref 80.0–100.0)
Monocytes Absolute: 0.7 10*3/uL (ref 0.1–1.0)
Monocytes Relative: 7 %
Neutro Abs: 6.3 10*3/uL (ref 1.7–7.7)
Neutrophils Relative %: 59 %
Platelets: 420 10*3/uL — ABNORMAL HIGH (ref 150–400)
RBC: 4.87 MIL/uL (ref 3.87–5.11)
RDW: 12.5 % (ref 11.5–15.5)
WBC: 10.6 10*3/uL — ABNORMAL HIGH (ref 4.0–10.5)
nRBC: 0 % (ref 0.0–0.2)

## 2021-07-24 LAB — TROPONIN I (HIGH SENSITIVITY): Troponin I (High Sensitivity): 3 ng/L (ref <18)

## 2021-07-24 LAB — I-STAT BETA HCG BLOOD, ED (MC, WL, AP ONLY): I-stat hCG, quantitative: <5 m[IU]/mL (ref <5)

## 2021-07-24 NOTE — ED Triage Notes (Signed)
Pt arrived POV from work c/o centralized CP that started about a hr ago. Pt states the pain radiates to her back and down her left arm. Pt states she has a hx of acid reflux and it could be that but her sister died of an MI at 38.  ?

## 2021-07-24 NOTE — ED Notes (Signed)
Called PT for Vitals no answer 16:30 ?

## 2021-07-24 NOTE — ED Notes (Signed)
No answer for labs multiple times ?

## 2021-07-24 NOTE — ED Notes (Signed)
Pt not answering for vital recheck  

## 2021-07-24 NOTE — ED Provider Triage Note (Signed)
Emergency Medicine Provider Triage Evaluation Note ? ?Monique Forbes , a 31 y.o. female  was evaluated in triage.  Pt complains of chest pain.  Reports left-sided chest pain.  Has a history of "indigestion" but is unsure if this is related to something like that.  She takes Pepcid daily for her reflux.  Has not taken any medicine to help with this pain.  Pain has been intermittent without specific aggravating or alleviating factor.  No leg swelling or recent immobilization, no cardiac history that she is aware of.  She does have a family history of cardiac disease at a young age. ? ?Review of Systems  ?Positive: Chest pain ?Negative: Shortness of breath ? ?Physical Exam  ?BP (!) 139/93 (BP Location: Left Arm)   Pulse 93   Temp 98.1 ?F (36.7 ?C) (Oral)   Resp 17   Ht 5\' 5"  (1.651 m)   Wt 93.4 kg   SpO2 96%   BMI 34.28 kg/m?  ?Gen:   Awake, no distress   ?Resp:  Normal effort  ?MSK:   Moves extremities without difficulty  ?Other:  Lungs are clear to auscultation bilaterally ? ?Medical Decision Making  ?Medically screening exam initiated at 1:51 PM.  Appropriate orders placed.  Monique Forbes was informed that the remainder of the evaluation will be completed by another provider, this initial triage assessment does not replace that evaluation, and the importance of remaining in the ED until their evaluation is complete. ? ?Work-up initiated ?  ? , PA-C ?07/24/21 1352 ? ?

## 2021-07-28 ENCOUNTER — Telehealth: Payer: Self-pay

## 2021-07-28 ENCOUNTER — Encounter: Payer: Self-pay | Admitting: Nurse Practitioner

## 2021-07-28 ENCOUNTER — Ambulatory Visit: Payer: Medicaid Other | Admitting: Nurse Practitioner

## 2021-07-28 VITALS — BP 138/90 | HR 84 | Temp 97.8°F | Resp 16 | Ht 65.0 in | Wt 209.6 lb

## 2021-07-28 DIAGNOSIS — R109 Unspecified abdominal pain: Secondary | ICD-10-CM

## 2021-07-28 DIAGNOSIS — R1114 Bilious vomiting: Secondary | ICD-10-CM | POA: Diagnosis not present

## 2021-07-28 DIAGNOSIS — Z6835 Body mass index (BMI) 35.0-35.9, adult: Secondary | ICD-10-CM

## 2021-07-28 DIAGNOSIS — R140 Abdominal distension (gaseous): Secondary | ICD-10-CM | POA: Diagnosis not present

## 2021-07-28 DIAGNOSIS — E1165 Type 2 diabetes mellitus with hyperglycemia: Secondary | ICD-10-CM | POA: Diagnosis not present

## 2021-07-28 DIAGNOSIS — E282 Polycystic ovarian syndrome: Secondary | ICD-10-CM | POA: Diagnosis not present

## 2021-07-28 DIAGNOSIS — K219 Gastro-esophageal reflux disease without esophagitis: Secondary | ICD-10-CM

## 2021-07-28 DIAGNOSIS — I10 Essential (primary) hypertension: Secondary | ICD-10-CM

## 2021-07-28 DIAGNOSIS — F411 Generalized anxiety disorder: Secondary | ICD-10-CM | POA: Diagnosis not present

## 2021-07-28 DIAGNOSIS — F332 Major depressive disorder, recurrent severe without psychotic features: Secondary | ICD-10-CM | POA: Diagnosis not present

## 2021-07-28 LAB — POCT GLYCOSYLATED HEMOGLOBIN (HGB A1C): Hemoglobin A1C: 5.4 % (ref 4.0–5.6)

## 2021-07-28 MED ORDER — SPIRONOLACTONE 25 MG PO TABS: 25.0000 mg | ORAL_TABLET | Freq: Every day | ORAL | 1 refills | Status: DC

## 2021-07-28 MED ORDER — SERTRALINE HCL 50 MG PO TABS: 50.0000 mg | ORAL_TABLET | Freq: Every day | ORAL | 3 refills | Status: DC

## 2021-07-28 MED ORDER — AMLODIPINE BESYLATE 5 MG PO TABS: 5.0000 mg | ORAL_TABLET | Freq: Every day | ORAL | 1 refills | Status: DC

## 2021-07-28 MED ORDER — OMEPRAZOLE 40 MG PO CPDR: 40.0000 mg | DELAYED_RELEASE_CAPSULE | Freq: Every day | ORAL | 1 refills | Status: DC

## 2021-07-28 MED ORDER — DULAGLUTIDE 1.5 MG/0.5ML ~~LOC~~ SOAJ: 1.5000 mg | SUBCUTANEOUS | 1 refills | Status: DC
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
## 2021-07-28 NOTE — Patient Instructions (Signed)
Thank you for speaking with me today regarding care management and care coordination needs.  If you change your mind or have any questions please give me a call at [phone].  Gus Puma, BSW, [facility]  [place]  High Risk Managed Medicaid Team  [phone]

## 2021-07-28 NOTE — Patient Outreach (Signed)
Care Coordination  07/28/2021  Monique Forbes 1990-07-24 [id number]  Transition Care Management Follow-up Telephone Call Date of discharge and from where: 07/24/21 [facility] How have you been since you were released from the hospital? Okay Any questions or concerns? No  Items Reviewed: Did the pt receive and understand the discharge instructions provided? Yes  Medications obtained and verified? Yes  Other? No  Any new allergies since your discharge? No  Dietary orders reviewed? No Do you have support at home? Yes   Functional Questionnaire: (I = Independent and D = Dependent) ADLs: I  Bathing/Dressing- I  Meal Prep- I  Eating- I  Maintaining continence- I II  Managing Meds- I  Follow up appointments reviewed:  PCP Hospital f/u appt confirmed? Yes  Scheduled to see [place] on 07/28/21 @ 8:40. Specialist Hospital f/u appt confirmed? No  Scheduled to see  Are transportation arrangements needed? No  If their condition worsens, is the pt aware to call PCP or go to the Emergency Dept.? Yes Was the patient provided with contact information for the PCP's office or ED? Yes Was to pt encouraged to call back with questions or concerns? No Patient denied MM services at this time.

## 2021-07-30 ENCOUNTER — Encounter: Payer: Self-pay | Admitting: Nurse Practitioner

## 2021-07-30 DIAGNOSIS — E66812 Obesity, class 2: Secondary | ICD-10-CM | POA: Insufficient documentation

## 2021-08-10 ENCOUNTER — Ambulatory Visit (HOSPITAL_COMMUNITY)
Admission: EM | Admit: 2021-08-10 | Discharge: 2021-08-10 | Disposition: A | Discharge status: Home / Self Care | Payer: Medicaid Other | Attending: Internal Medicine | Admitting: Internal Medicine

## 2021-08-10 ENCOUNTER — Encounter (HOSPITAL_COMMUNITY): Payer: Self-pay

## 2021-08-10 DIAGNOSIS — J029 Acute pharyngitis, unspecified: Secondary | ICD-10-CM | POA: Diagnosis present

## 2021-08-10 DIAGNOSIS — J069 Acute upper respiratory infection, unspecified: Secondary | ICD-10-CM

## 2021-08-10 DIAGNOSIS — Z20822 Contact with and (suspected) exposure to covid-19: Secondary | ICD-10-CM | POA: Insufficient documentation

## 2021-08-10 DIAGNOSIS — Z419 Encounter for procedure for purposes other than remedying health state, unspecified: Secondary | ICD-10-CM | POA: Diagnosis not present

## 2021-08-10 LAB — POCT RAPID STREP A, ED / UC: Streptococcus, Group A Screen (Direct): NEGATIVE

## 2021-08-10 MED ORDER — BENZONATATE 100 MG PO CAPS: 100.0000 mg | ORAL_CAPSULE | Freq: Three times a day (TID) | ORAL | 0 refills | Status: DC | PRN

## 2021-08-10 MED ORDER — PHENOL 1.4 % MT LIQD: 1.0000 | OROMUCOSAL | 0 refills | Status: DC | PRN

## 2021-08-10 MED ORDER — IBUPROFEN 600 MG PO TABS: 600.0000 mg | ORAL_TABLET | Freq: Four times a day (QID) | ORAL | 0 refills | Status: DC | PRN

## 2021-08-10 NOTE — ED Triage Notes (Signed)
Pt presents with fatigue, sore throat, cough, and generalized body aches since yesterday.

## 2021-08-10 NOTE — Discharge Instructions (Addendum)
Increase oral fluid intake Take medications as prescribed We will call you with recommendations if labs are abnormal Return to urgent care if symptoms worsen. 

## 2021-08-11 LAB — SARS CORONAVIRUS 2 (TAT 6-24 HRS): SARS Coronavirus 2: NEGATIVE

## 2021-08-12 LAB — CULTURE, GROUP A STREP (THRC)
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
## 2021-08-17 ENCOUNTER — Telehealth: Payer: Self-pay

## 2021-08-17 ENCOUNTER — Other Ambulatory Visit: Payer: Self-pay

## 2021-08-17 DIAGNOSIS — F341 Dysthymic disorder: Secondary | ICD-10-CM | POA: Diagnosis not present

## 2021-08-17 MED ORDER — VALACYCLOVIR HCL 1 G PO TABS: 1000.0000 mg | ORAL_TABLET | Freq: Two times a day (BID) | ORAL | 0 refills | Status: AC

## 2021-08-17 MED ORDER — GABAPENTIN 100 MG PO CAPS: 100.0000 mg | ORAL_CAPSULE | Freq: Three times a day (TID) | ORAL | 0 refills | Status: DC

## 2021-08-17 NOTE — Telephone Encounter (Signed)
Pt called and spoke with me and alyssa that she had shingles on left side of stomach area and its painful as per alyssa advised th we send gabapentin and valacyclovir and also gave her work note for 7 days advised not feeling better need appt

## 2021-08-18 ENCOUNTER — Other Ambulatory Visit: Payer: Self-pay | Admitting: Nurse Practitioner

## 2021-08-18 ENCOUNTER — Other Ambulatory Visit: Payer: Self-pay

## 2021-08-18 DIAGNOSIS — E282 Polycystic ovarian syndrome: Secondary | ICD-10-CM

## 2021-08-18 DIAGNOSIS — I10 Essential (primary) hypertension: Secondary | ICD-10-CM

## 2021-08-18 MED ORDER — SPIRONOLACTONE 25 MG PO TABS: 25.0000 mg | ORAL_TABLET | Freq: Every day | ORAL | 1 refills | Status: DC

## 2021-08-18 MED ORDER — SPIRONOLACTONE 25 MG PO TABS: 25.0000 mg | ORAL_TABLET | Freq: Every day | ORAL | 0 refills | Status: DC

## 2021-09-09 DIAGNOSIS — Z419 Encounter for procedure for purposes other than remedying health state, unspecified: Secondary | ICD-10-CM | POA: Diagnosis not present

## 2021-09-28 ENCOUNTER — Other Ambulatory Visit: Payer: Self-pay

## 2021-09-28 DIAGNOSIS — E282 Polycystic ovarian syndrome: Secondary | ICD-10-CM

## 2021-09-28 DIAGNOSIS — I10 Essential (primary) hypertension: Secondary | ICD-10-CM

## 2021-09-28 DIAGNOSIS — E1165 Type 2 diabetes mellitus with hyperglycemia: Secondary | ICD-10-CM

## 2021-09-28 MED ORDER — METFORMIN HCL ER 500 MG PO TB24: 1000.0000 mg | ORAL_TABLET | Freq: Every day | ORAL | 1 refills | Status: DC

## 2021-09-28 MED ORDER — SPIRONOLACTONE 25 MG PO TABS: 25.0000 mg | ORAL_TABLET | Freq: Every day | ORAL | 0 refills | Status: DC

## 2021-09-28 MED ORDER — AMLODIPINE BESYLATE 5 MG PO TABS: 5.0000 mg | ORAL_TABLET | Freq: Every day | ORAL | 1 refills | Status: DC

## 2021-10-10 DIAGNOSIS — Z419 Encounter for procedure for purposes other than remedying health state, unspecified: Secondary | ICD-10-CM | POA: Diagnosis not present

## 2021-10-13 ENCOUNTER — Other Ambulatory Visit: Payer: Self-pay | Admitting: Nurse Practitioner

## 2021-10-13 DIAGNOSIS — E282 Polycystic ovarian syndrome: Secondary | ICD-10-CM

## 2021-10-13 DIAGNOSIS — I10 Essential (primary) hypertension: Secondary | ICD-10-CM

## 2021-10-27 ENCOUNTER — Ambulatory Visit: Payer: Medicaid Other | Admitting: Nurse Practitioner

## 2021-10-27 ENCOUNTER — Encounter: Payer: Self-pay | Admitting: Nurse Practitioner

## 2021-10-27 VITALS — BP 123/82 | HR 94 | Temp 98.4°F | Resp 16 | Ht 65.0 in | Wt 203.6 lb

## 2021-10-27 DIAGNOSIS — Z760 Encounter for issue of repeat prescription: Secondary | ICD-10-CM | POA: Diagnosis not present

## 2021-10-27 DIAGNOSIS — F411 Generalized anxiety disorder: Secondary | ICD-10-CM

## 2021-10-27 DIAGNOSIS — E1165 Type 2 diabetes mellitus with hyperglycemia: Secondary | ICD-10-CM

## 2021-10-27 DIAGNOSIS — I10 Essential (primary) hypertension: Secondary | ICD-10-CM

## 2021-10-27 DIAGNOSIS — Z113 Encounter for screening for infections with a predominantly sexual mode of transmission: Secondary | ICD-10-CM

## 2021-10-27 DIAGNOSIS — Z0001 Encounter for general adult medical examination with abnormal findings: Secondary | ICD-10-CM | POA: Diagnosis not present

## 2021-10-27 DIAGNOSIS — Z124 Encounter for screening for malignant neoplasm of cervix: Secondary | ICD-10-CM

## 2021-10-27 DIAGNOSIS — R300 Dysuria: Secondary | ICD-10-CM | POA: Diagnosis not present

## 2021-10-27 DIAGNOSIS — F332 Major depressive disorder, recurrent severe without psychotic features: Secondary | ICD-10-CM

## 2021-10-27 LAB — POCT GLYCOSYLATED HEMOGLOBIN (HGB A1C): HbA1c POC (<> result, manual entry): 5.8 % (ref 4.0–5.6)

## 2021-10-27 MED ORDER — SERTRALINE HCL 50 MG PO TABS: 50.0000 mg | ORAL_TABLET | Freq: Every day | ORAL | 3 refills | Status: DC

## 2021-10-27 MED ORDER — AMLODIPINE BESYLATE 10 MG PO TABS: 10.0000 mg | ORAL_TABLET | Freq: Every day | ORAL | 3 refills | Status: DC
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
## 2021-10-28 LAB — UA/M W/RFLX CULTURE, ROUTINE
Bilirubin, UA: NEGATIVE
Glucose, UA: NEGATIVE
Ketones, UA: NEGATIVE
Leukocytes,UA: NEGATIVE
Nitrite, UA: NEGATIVE
RBC, UA: NEGATIVE
Specific Gravity, UA: 1.015 (ref 1.005–1.030)
Urobilinogen, Ur: 0.2 mg/dL (ref 0.2–1.0)
pH, UA: 8 — ABNORMAL HIGH (ref 5.0–7.5)

## 2021-10-28 LAB — MICROSCOPIC EXAMINATION
Bacteria, UA: NONE SEEN
Casts: NONE SEEN /lpf
RBC, Urine: NONE SEEN /hpf (ref 0–2)
WBC, UA: NONE SEEN /hpf (ref 0–5)

## 2021-10-31 DIAGNOSIS — F341 Dysthymic disorder: Secondary | ICD-10-CM | POA: Diagnosis not present

## 2021-11-09 ENCOUNTER — Encounter: Payer: Self-pay | Admitting: Gastroenterology

## 2021-11-09 ENCOUNTER — Ambulatory Visit (INDEPENDENT_AMBULATORY_CARE_PROVIDER_SITE_OTHER): Payer: Medicaid Other | Admitting: Gastroenterology

## 2021-11-09 VITALS — BP 126/85 | HR 94 | Temp 98.3°F | Ht 65.0 in | Wt 206.0 lb

## 2021-11-09 DIAGNOSIS — K219 Gastro-esophageal reflux disease without esophagitis: Secondary | ICD-10-CM | POA: Diagnosis not present

## 2021-11-09 DIAGNOSIS — R112 Nausea with vomiting, unspecified: Secondary | ICD-10-CM | POA: Diagnosis not present

## 2021-11-09 MED ORDER — OMEPRAZOLE 40 MG PO CPDR: 40.0000 mg | DELAYED_RELEASE_CAPSULE | Freq: Two times a day (BID) | ORAL | 2 refills | Status: DC

## 2021-11-09 NOTE — Patient Instructions (Signed)
I will call you later this afternoon with your ultrasound and gastric emptying study.  If you need to cancel or reschedule please call [phone]
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
## 2021-11-10 DIAGNOSIS — Z419 Encounter for procedure for purposes other than remedying health state, unspecified: Secondary | ICD-10-CM | POA: Diagnosis not present

## 2021-11-10 NOTE — Addendum Note (Signed)
Addended by: Roena Malady on: 11/10/2021 01:30 PM   Modules accepted: Orders

## 2021-11-17 ENCOUNTER — Encounter: Payer: Medicaid Other | Attending: Gastroenterology

## 2021-11-20 ENCOUNTER — Ambulatory Visit: Payer: Medicaid Other

## 2021-11-27 ENCOUNTER — Ambulatory Visit: Admit: 2021-11-27 | Payer: Medicaid Other | Admitting: Gastroenterology

## 2021-11-27 SURGERY — EGD (ESOPHAGOGASTRODUODENOSCOPY)
Anesthesia: Choice

## 2021-12-10 DIAGNOSIS — Z419 Encounter for procedure for purposes other than remedying health state, unspecified: Secondary | ICD-10-CM | POA: Diagnosis not present

## 2021-12-14 ENCOUNTER — Ambulatory Visit
Admission: EM | Admit: 2021-12-14 | Discharge: 2021-12-14 | Disposition: A | Discharge status: Home / Self Care | Payer: Medicaid Other | Attending: Nurse Practitioner | Admitting: Nurse Practitioner

## 2021-12-14 DIAGNOSIS — J069 Acute upper respiratory infection, unspecified: Secondary | ICD-10-CM

## 2021-12-14 MED ORDER — ALBUTEROL SULFATE HFA 108 (90 BASE) MCG/ACT IN AERS: 2.0000 | INHALATION_SPRAY | Freq: Four times a day (QID) | RESPIRATORY_TRACT | 0 refills | Status: DC | PRN
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
## 2021-12-14 NOTE — Discharge Instructions (Addendum)
Take medication as prescribed. May take over-the-counter Mucinex plain or Coricidin HBP to help with your cough. Increase fluids and allow for plenty of rest. Recommend Tylenol or ibuprofen as needed for pain, fever, or general discomfort. Recommend using a humidifier at bedtime during sleep to help with cough and nasal congestion. Sleep elevated on 2 pillows. Follow-up in this clinic or with your PCP if symptoms fail to improve over the next 10 to 14 days.

## 2021-12-14 NOTE — ED Triage Notes (Signed)
Pt reports sore throat, cough, shortness of breath, fever, chills x 3 days. Pt reports husband has pneumonia and she wants to make sure she do not have pneumonia.  Pt had negative COVID, sore throat and Flu 3 days ago.

## 2021-12-29 ENCOUNTER — Encounter: Payer: Self-pay | Admitting: Nurse Practitioner

## 2022-01-10 DIAGNOSIS — Z419 Encounter for procedure for purposes other than remedying health state, unspecified: Secondary | ICD-10-CM | POA: Diagnosis not present

## 2022-01-11 ENCOUNTER — Telehealth (INDEPENDENT_AMBULATORY_CARE_PROVIDER_SITE_OTHER): Payer: Medicaid Other | Admitting: Nurse Practitioner

## 2022-01-11 ENCOUNTER — Encounter: Payer: Self-pay | Admitting: Nurse Practitioner

## 2022-01-11 VITALS — Resp 16 | Ht 65.0 in | Wt 205.0 lb

## 2022-01-11 DIAGNOSIS — F418 Other specified anxiety disorders: Secondary | ICD-10-CM

## 2022-01-11 DIAGNOSIS — E119 Type 2 diabetes mellitus without complications: Secondary | ICD-10-CM

## 2022-01-11 DIAGNOSIS — F5105 Insomnia due to other mental disorder: Secondary | ICD-10-CM

## 2022-01-11 MED ORDER — DULAGLUTIDE 0.75 MG/0.5ML ~~LOC~~ SOAJ: 0.7500 mg | SUBCUTANEOUS | 2 refills | Status: DC

## 2022-01-11 MED ORDER — ZOLPIDEM TARTRATE 5 MG PO TABS: 5.0000 mg | ORAL_TABLET | Freq: Every evening | ORAL | 0 refills | Status: DC | PRN
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
## 2022-01-15 DIAGNOSIS — F341 Dysthymic disorder: Secondary | ICD-10-CM | POA: Diagnosis not present

## 2022-01-26 ENCOUNTER — Telehealth: Payer: Medicaid Other | Admitting: Nurse Practitioner

## 2022-02-07 DIAGNOSIS — F341 Dysthymic disorder: Secondary | ICD-10-CM | POA: Diagnosis not present

## 2022-02-09 DIAGNOSIS — Z419 Encounter for procedure for purposes other than remedying health state, unspecified: Secondary | ICD-10-CM | POA: Diagnosis not present

## 2022-02-13 ENCOUNTER — Other Ambulatory Visit: Payer: Self-pay | Admitting: Nurse Practitioner

## 2022-02-13 DIAGNOSIS — I10 Essential (primary) hypertension: Secondary | ICD-10-CM

## 2022-02-13 DIAGNOSIS — E282 Polycystic ovarian syndrome: Secondary | ICD-10-CM

## 2022-02-14 NOTE — Telephone Encounter (Signed)
Pt need appt for further refills.

## 2022-02-16 ENCOUNTER — Emergency Department (HOSPITAL_COMMUNITY)
Admission: EM | Admit: 2022-02-16 | Discharge: 2022-02-16 | Discharge status: Against Medical Advice | Payer: No Typology Code available for payment source | Source: Home / Self Care

## 2022-02-18 ENCOUNTER — Other Ambulatory Visit: Payer: Self-pay

## 2022-02-18 ENCOUNTER — Emergency Department (HOSPITAL_COMMUNITY)
Admission: EM | Admit: 2022-02-18 | Discharge: 2022-02-18 | Disposition: A | Discharge status: Home / Self Care | Payer: No Typology Code available for payment source | Attending: Emergency Medicine | Admitting: Emergency Medicine

## 2022-02-18 ENCOUNTER — Encounter (HOSPITAL_COMMUNITY): Payer: Self-pay | Admitting: *Deleted

## 2022-02-18 ENCOUNTER — Emergency Department (HOSPITAL_COMMUNITY): Payer: No Typology Code available for payment source

## 2022-02-18 DIAGNOSIS — Z79899 Other long term (current) drug therapy: Secondary | ICD-10-CM | POA: Diagnosis not present

## 2022-02-18 DIAGNOSIS — I10 Essential (primary) hypertension: Secondary | ICD-10-CM | POA: Diagnosis present

## 2022-02-18 DIAGNOSIS — R519 Headache, unspecified: Secondary | ICD-10-CM | POA: Diagnosis not present

## 2022-02-18 DIAGNOSIS — R079 Chest pain, unspecified: Secondary | ICD-10-CM

## 2022-02-18 LAB — CBC WITH DIFFERENTIAL/PLATELET
Abs Immature Granulocytes: 0.06 10*3/uL (ref 0.00–0.07)
Basophils Absolute: 0.1 10*3/uL (ref 0.0–0.1)
Basophils Relative: 1 %
Eosinophils Absolute: 0.4 10*3/uL (ref 0.0–0.5)
Eosinophils Relative: 4 %
HCT: 36.4 % (ref 36.0–46.0)
Hemoglobin: 12.1 g/dL (ref 12.0–15.0)
Immature Granulocytes: 1 %
Lymphocytes Relative: 31 %
Lymphs Abs: 3.2 10*3/uL (ref 0.7–4.0)
MCH: 27.7 pg (ref 26.0–34.0)
MCHC: 33.2 g/dL (ref 30.0–36.0)
MCV: 83.3 fL (ref 80.0–100.0)
Monocytes Absolute: 0.7 10*3/uL (ref 0.1–1.0)
Monocytes Relative: 7 %
Neutro Abs: 6 10*3/uL (ref 1.7–7.7)
Neutrophils Relative %: 56 %
Platelets: 370 10*3/uL (ref 150–400)
RBC: 4.37 MIL/uL (ref 3.87–5.11)
RDW: 12.1 % (ref 11.5–15.5)
WBC: 10.3 10*3/uL (ref 4.0–10.5)
nRBC: 0 % (ref 0.0–0.2)

## 2022-02-18 LAB — COMPREHENSIVE METABOLIC PANEL
ALT: 33 U/L (ref 0–44)
AST: 22 U/L (ref 15–41)
Albumin: 3.5 g/dL (ref 3.5–5.0)
Alkaline Phosphatase: 28 U/L — ABNORMAL LOW (ref 38–126)
Anion gap: 8 (ref 5–15)
BUN: 8 mg/dL (ref 6–20)
CO2: 24 mmol/L (ref 22–32)
Calcium: 8.6 mg/dL — ABNORMAL LOW (ref 8.9–10.3)
Chloride: 105 mmol/L (ref 98–111)
Creatinine, Ser: 0.5 mg/dL (ref 0.44–1.00)
GFR, Estimated: >60 mL/min (ref >60)
Glucose, Bld: 131 mg/dL — ABNORMAL HIGH (ref 70–99)
Potassium: 3.4 mmol/L — ABNORMAL LOW (ref 3.5–5.1)
Sodium: 137 mmol/L (ref 135–145)
Total Bilirubin: 0.3 mg/dL (ref 0.3–1.2)
Total Protein: 6.9 g/dL (ref 6.5–8.1)

## 2022-02-18 LAB — TROPONIN I (HIGH SENSITIVITY): Troponin I (High Sensitivity): 2 ng/L (ref <18)

## 2022-02-18 LAB — POC URINE PREG, ED: Preg Test, Ur: NEGATIVE

## 2022-02-18 LAB — D-DIMER, QUANTITATIVE: D-Dimer, Quant: <0.27 ug/mL-FEU (ref 0.00–0.50)

## 2022-02-18 NOTE — ED Triage Notes (Signed)
Pt with c/o high blood pressure x  2 days.  Had some chest pain PTA, currently at present to mid chest.  Pt with some lightheadedness.

## 2022-02-18 NOTE — ED Notes (Signed)
ED Provider at bedside. 

## 2022-02-18 NOTE — Discharge Instructions (Signed)
You were seen in the emergency department for your chest pain and your high blood pressure.  Your workup here showed no signs of heart attack or stress on your heart, no signs of blood clots and normal kidney function and electrolytes.  Your blood pressure came down on its own.  You should follow-up with your primary doctor in the next few days to have your symptoms and your blood pressure rechecked.  He should return to the emergency department for significantly worsening chest pain, worsening shortness of breath, if you pass out or if you have any other new or concerning symptoms.

## 2022-02-18 NOTE — ED Provider Notes (Signed)
[facility] EMERGENCY DEPARTMENT Provider Note   CSN: [id number] Arrival date & time: 02/18/22  2015     History  Chief Complaint  Patient presents with   Hypertension    Geral Fellman is a 31 y.o. female.  Patient is a 31 year old female with a past medical history of hypertension presenting to the emergency department with high blood pressure, chest pain and headaches.  Patient states that she was checking her blood pressure at home and her blood pressure was reading over 200 systolic.  She states that she was having a headache in her posterior head and neck.  She denied any vision changes, numbness or weakness.  She states that she also felt some chest pressure with shortness of breath.  She denies any history of blood clots but does report a family history of early cardiac disease.  The history is provided by the patient and the spouse.  Hypertension       Home Medications Prior to Admission medications   Medication Sig Start Date End Date Taking? Authorizing Provider  albuterol (VENTOLIN HFA) 108 (90 Base) MCG/ACT inhaler Inhale 2 puffs into the lungs every 6 (six) hours as needed for wheezing or shortness of breath. 12/14/21   Leath-Warren, Sadie Haber, NP  amLODipine (NORVASC) 10 MG tablet Take 1 tablet (10 mg total) by mouth daily. 10/27/21   Sallyanne Kuster, NP  Cholecalciferol (VITAMIN D3) 125 MCG (5000 UT) CAPS Take 1 capsule by mouth daily.    [provider]  Dulaglutide 0.75 MG/0.5ML SOPN Inject 0.75 mg into the skin once a week. 01/11/22   Sallyanne Kuster, NP  EPINEPHrine (EPIPEN 2-PAK) 0.3 mg/0.3 mL IJ SOAJ injection Inject 0.3 mg into the muscle as needed for anaphylaxis. 06/14/21   Sallyanne Kuster, NP  fluticasone (FLONASE) 50 MCG/ACT nasal spray Place 1 spray into both nostrils 2 (two) times daily. 06/07/21   Particia Nearing, PA-C  ibuprofen (ADVIL) 600 MG tablet Take 1 tablet (600 mg total) by mouth every 6 (six) hours as needed. 08/10/21    Merrilee Jansky, MD  metFORMIN (GLUCOPHAGE-XR) 500 MG 24 hr tablet Take 2 tablets (1,000 mg total) by mouth daily with breakfast. 09/28/21   Sallyanne Kuster, NP  omeprazole (PRILOSEC) 40 MG capsule Take 1 capsule (40 mg total) by mouth 2 (two) times daily. 11/09/21   Midge Minium, MD  ondansetron (ZOFRAN ODT) 4 MG disintegrating tablet Take 1 tablet (4 mg total) by mouth every 8 (eight) hours as needed for nausea or vomiting. 07/02/21   Eber Hong, MD  phenol (CHLORASEPTIC) 1.4 % LIQD Use as directed 1 spray in the mouth or throat as needed for throat irritation / pain. 08/10/21   Merrilee Jansky, MD  Probiotic Product (PROBIOTIC PO) Take 1 tablet by mouth daily.    [provider]  sertraline (ZOLOFT) 50 MG tablet Take 1 tablet (50 mg total) by mouth daily. 10/27/21   Sallyanne Kuster, NP  spironolactone (ALDACTONE) 25 MG tablet Take 1 tablet by mouth once daily 02/14/22   Sallyanne Kuster, NP  zolpidem (AMBIEN) 5 MG tablet Take 1 tablet (5 mg total) by mouth at bedtime as needed for sleep. 01/11/22   Sallyanne Kuster, NP  levocetirizine (XYZAL) 5 MG tablet Take 1 tablet (5 mg total) by mouth every evening. Patient not taking: No sig reported 07/31/20 08/04/20  Bing Neighbors, FNP      Allergies    Bee venom, Flavoring agent, and Cashew nut (anacardium occidentale) skin test  Review of Systems   Review of Systems  Physical Exam Updated Vital Signs BP 131/77   Pulse 100   Temp 98.3 F (36.8 C) (Oral)   Resp 20   Ht 5\' 5"  (1.651 m)   Wt 96.8 kg   SpO2 99%   BMI 35.51 kg/m  Physical Exam Vitals and nursing note reviewed.  Constitutional:      General: She is not in acute distress.    Appearance: Normal appearance.  HENT:     Head: Normocephalic and atraumatic.     Nose: Nose normal.     Mouth/Throat:     Mouth: Mucous membranes are moist.     Pharynx: Oropharynx is clear.  Eyes:     Extraocular Movements: Extraocular movements intact.     Conjunctiva/sclera:  Conjunctivae normal.  Cardiovascular:     Rate and Rhythm: Normal rate and regular rhythm.     Pulses: Normal pulses.     Heart sounds: Normal heart sounds.  Pulmonary:     Effort: Pulmonary effort is normal.     Breath sounds: Normal breath sounds.  Abdominal:     General: Abdomen is flat.     Palpations: Abdomen is soft.     Tenderness: There is no abdominal tenderness.  Musculoskeletal:        General: Normal range of motion.     Cervical back: Normal range of motion and neck supple.     Right lower leg: No edema.     Left lower leg: No edema.  Skin:    General: Skin is warm and dry.  Neurological:     General: No focal deficit present.     Mental Status: She is alert and oriented to person, place, and time.  Psychiatric:        Mood and Affect: Mood normal.        Behavior: Behavior normal.     ED Results / Procedures / Treatments   Labs (all labs ordered are listed, but only abnormal results are displayed) Labs Reviewed  COMPREHENSIVE METABOLIC PANEL - Abnormal; Notable for the following components:      Result Value   Potassium 3.4 (*)    Glucose, Bld 131 (*)    Calcium 8.6 (*)    Alkaline Phosphatase 28 (*)    All other components within normal limits  CBC WITH DIFFERENTIAL/PLATELET  D-DIMER, QUANTITATIVE  POC URINE PREG, ED  TROPONIN I (HIGH SENSITIVITY)    EKG EKG Interpretation  Date/Time:  Sunday February 18 2022 20:35:35 EST Ventricular Rate:  107 PR Interval:  138 QRS Duration: 94 QT Interval:  340 QTC Calculation: 453 R Axis:   72 Text Interpretation: Sinus tachycardia Otherwise normal ECG When compared with ECG of 24-Jul-2021 13:43, No significant change was found Confirmed by 26-Jul-2021 ([id number]) on 02/18/2022 9:02:44 PM  Radiology DG Chest Portable 1 View  Result Date: 02/18/2022 CLINICAL DATA:  Chest pain EXAM: PORTABLE CHEST 1 VIEW COMPARISON:  None Available. FINDINGS: The heart size and mediastinal contours are within normal  limits. Both lungs are clear. The visualized skeletal structures are unremarkable. IMPRESSION: No active disease. Electronically Signed   By: 14/12/2021 M.D.   On: 02/18/2022 20:57    Procedures Procedures    Medications Ordered in ED Medications - No data to display  ED Course/ Medical Decision Making/ A&P  Medical Decision Making This patient presents to the ED with chief complaint(s) of chest pain, HA, HTN with pertinent past medical history of HTN which further complicates the presenting complaint. The complaint involves an extensive differential diagnosis and also carries with it a high risk of complications and morbidity.    The differential diagnosis includes ACS, arrhythmia, electrolyte abnormality, endorgan damage, patient has no focal neurologic deficits making CVA, ICH or mass effect unlikely, patient is low risk for PE, however does not PERC out due to her tachycardia  Additional history obtained: Additional history obtained from spouse Records reviewed N/A  ED Course and Reassessment: Patient was awake alert and well-appearing though mildly tachycardic on arrival.  EKG had no acute ischemic changes.  She will troponin performed and single troponin will be sufficient as she has had symptoms over the last 2 days.  She is low risk for PE but is tachycardic.  D-dimer will be performed to evaluate for cause of her chest pain.  Blood pressure is well-controlled on arrival and she will be closely reassessed.  Independent labs interpretation:  The following labs were independently interpreted: Within normal range  Independent visualization of imaging: - I independently visualized the following imaging with scope of interpretation limited to determining acute life threatening conditions related to emergency care: Chest x-ray, which revealed no acute disease  Consultation: - Consulted or discussed management/test interpretation w/ external professional:  N/A  Consideration for admission or further workup: Patient has no emergent conditions requiring admission or further work-up at this time and is stable for discharge home with primary care follow-up  Social Determinants of health: N/A    Amount and/or Complexity of Data Reviewed Labs: ordered. Radiology: ordered.          Final Clinical Impression(s) / ED Diagnoses Final diagnoses:  Hypertension, unspecified type  Chest pain, unspecified type    Rx / DC Orders ED Discharge Orders     None         Rexford Maus, DO 02/18/22 2357

## 2022-02-19 ENCOUNTER — Ambulatory Visit: Payer: Medicaid Other | Admitting: Physician Assistant

## 2022-02-22 ENCOUNTER — Encounter: Payer: Self-pay | Admitting: Nurse Practitioner

## 2022-02-22 ENCOUNTER — Ambulatory Visit: Payer: Medicaid Other | Admitting: Nurse Practitioner

## 2022-02-22 VITALS — BP 140/82 | HR 120 | Temp 98.3°F | Resp 16 | Ht 65.0 in | Wt 209.8 lb

## 2022-02-22 DIAGNOSIS — E282 Polycystic ovarian syndrome: Secondary | ICD-10-CM

## 2022-02-22 DIAGNOSIS — E119 Type 2 diabetes mellitus without complications: Secondary | ICD-10-CM

## 2022-02-22 DIAGNOSIS — I10 Essential (primary) hypertension: Secondary | ICD-10-CM | POA: Diagnosis not present

## 2022-02-22 DIAGNOSIS — R600 Localized edema: Secondary | ICD-10-CM

## 2022-02-22 LAB — POCT GLYCOSYLATED HEMOGLOBIN (HGB A1C): Hemoglobin A1C: 5.5 % (ref 4.0–5.6)

## 2022-02-22 MED ORDER — HYDROCHLOROTHIAZIDE 12.5 MG PO TABS: 12.5000 mg | ORAL_TABLET | Freq: Every day | ORAL | 3 refills | Status: DC

## 2022-02-22 MED ORDER — DULAGLUTIDE 1.5 MG/0.5ML ~~LOC~~ SOAJ: 1.5000 mg | SUBCUTANEOUS | 5 refills | Status: DC

## 2022-02-22 MED ORDER — AMLODIPINE-OLMESARTAN 10-20 MG PO TABS: 1.0000 | ORAL_TABLET | Freq: Every day | ORAL | 2 refills | Status: DC
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
## 2022-02-26 ENCOUNTER — Emergency Department (HOSPITAL_COMMUNITY): Payer: No Typology Code available for payment source

## 2022-02-26 ENCOUNTER — Emergency Department (HOSPITAL_COMMUNITY)
Admission: EM | Admit: 2022-02-26 | Discharge: 2022-02-26 | Disposition: A | Discharge status: Home / Self Care | Payer: No Typology Code available for payment source | Attending: Emergency Medicine | Admitting: Emergency Medicine

## 2022-02-26 DIAGNOSIS — R079 Chest pain, unspecified: Secondary | ICD-10-CM | POA: Diagnosis present

## 2022-02-26 DIAGNOSIS — R002 Palpitations: Secondary | ICD-10-CM | POA: Diagnosis not present

## 2022-02-26 DIAGNOSIS — Z79899 Other long term (current) drug therapy: Secondary | ICD-10-CM | POA: Diagnosis not present

## 2022-02-26 DIAGNOSIS — E119 Type 2 diabetes mellitus without complications: Secondary | ICD-10-CM | POA: Diagnosis not present

## 2022-02-26 DIAGNOSIS — I10 Essential (primary) hypertension: Secondary | ICD-10-CM | POA: Diagnosis not present

## 2022-02-26 DIAGNOSIS — Z7984 Long term (current) use of oral hypoglycemic drugs: Secondary | ICD-10-CM | POA: Insufficient documentation

## 2022-02-26 DIAGNOSIS — R000 Tachycardia, unspecified: Secondary | ICD-10-CM | POA: Insufficient documentation

## 2022-02-26 LAB — BASIC METABOLIC PANEL
Anion gap: 10 (ref 5–15)
BUN: 11 mg/dL (ref 6–20)
CO2: 21 mmol/L — ABNORMAL LOW (ref 22–32)
Calcium: 8.9 mg/dL (ref 8.9–10.3)
Chloride: 106 mmol/L (ref 98–111)
Creatinine, Ser: 0.55 mg/dL (ref 0.44–1.00)
GFR, Estimated: >60 mL/min (ref >60)
Glucose, Bld: 120 mg/dL — ABNORMAL HIGH (ref 70–99)
Potassium: 3.8 mmol/L (ref 3.5–5.1)
Sodium: 137 mmol/L (ref 135–145)

## 2022-02-26 LAB — TROPONIN I (HIGH SENSITIVITY)
Troponin I (High Sensitivity): 3 ng/L (ref <18)
Troponin I (High Sensitivity): 4 ng/L (ref <18)

## 2022-02-26 LAB — CBC
HCT: 40.6 % (ref 36.0–46.0)
Hemoglobin: 13.4 g/dL (ref 12.0–15.0)
MCH: 27.5 pg (ref 26.0–34.0)
MCHC: 33 g/dL (ref 30.0–36.0)
MCV: 83.2 fL (ref 80.0–100.0)
Platelets: 408 10*3/uL — ABNORMAL HIGH (ref 150–400)
RBC: 4.88 MIL/uL (ref 3.87–5.11)
RDW: 12.1 % (ref 11.5–15.5)
WBC: 7.9 10*3/uL (ref 4.0–10.5)
nRBC: 0 % (ref 0.0–0.2)

## 2022-02-26 LAB — D-DIMER, QUANTITATIVE: D-Dimer, Quant: 0.32 ug/mL-FEU (ref 0.00–0.50)

## 2022-02-26 LAB — POC URINE PREG, ED: Preg Test, Ur: NEGATIVE

## 2022-02-26 NOTE — ED Triage Notes (Signed)
Pt arrives via [facility] for tachycardia and chest pain. Pt states this started when waking up. Pt states initially HR was in the 160's on home pulse oximeter. Pain has resolved at arrival per pt.

## 2022-02-26 NOTE — Discharge Instructions (Signed)
You have been referred to local cardiology.  You are likely having palpitations.  Someone from the cardiology office should contact you in the next 24 to 48 hours to arrange follow-up appointment.  You may contact the office listed if you are not contacted within the next 24 to 48 hours.  Return emergency department for any new or worsening symptoms.

## 2022-02-26 NOTE — ED Provider Notes (Signed)
[facility] EMERGENCY DEPARTMENT Provider Note   CSN: [id number] Arrival date & time: 02/26/22  1431     History {Add pertinent medical, surgical, social history, OB history to HPI:1} Chief Complaint  Patient presents with   Chest Pain    Monique Forbes is a 31 y.o. female.   Chest Pain      Home Medications Prior to Admission medications   Medication Sig Start Date End Date Taking? Authorizing Provider  albuterol (VENTOLIN HFA) 108 (90 Base) MCG/ACT inhaler Inhale 2 puffs into the lungs every 6 (six) hours as needed for wheezing or shortness of breath. 12/14/21   Leath-Warren, Sadie Haber, NP  amLODipine (NORVASC) 10 MG tablet Take 1 tablet (10 mg total) by mouth daily. 10/27/21   Sallyanne Kuster, NP  amlodipine-olmesartan (AZOR) 10-20 MG tablet Take 1 tablet by mouth daily. 02/22/22   Sallyanne Kuster, NP  Cholecalciferol (VITAMIN D3) 125 MCG (5000 UT) CAPS Take 1 capsule by mouth daily.    [provider]  Dulaglutide 1.5 MG/0.5ML SOPN Inject 1.5 mg into the skin once a week. 02/22/22   Sallyanne Kuster, NP  EPINEPHrine (EPIPEN 2-PAK) 0.3 mg/0.3 mL IJ SOAJ injection Inject 0.3 mg into the muscle as needed for anaphylaxis. 06/14/21   Sallyanne Kuster, NP  fluticasone (FLONASE) 50 MCG/ACT nasal spray Place 1 spray into both nostrils 2 (two) times daily. 06/07/21   Particia Nearing, PA-C  hydrochlorothiazide (HYDRODIURIL) 12.5 MG tablet Take 1 tablet (12.5 mg total) by mouth daily. 02/22/22   Sallyanne Kuster, NP  ibuprofen (ADVIL) 600 MG tablet Take 1 tablet (600 mg total) by mouth every 6 (six) hours as needed. 08/10/21   Merrilee Jansky, MD  metFORMIN (GLUCOPHAGE-XR) 500 MG 24 hr tablet Take 2 tablets (1,000 mg total) by mouth daily with breakfast. 09/28/21   Sallyanne Kuster, NP  omeprazole (PRILOSEC) 40 MG capsule Take 1 capsule (40 mg total) by mouth 2 (two) times daily. 11/09/21   Midge Minium, MD  ondansetron (ZOFRAN ODT) 4 MG disintegrating tablet Take 1  tablet (4 mg total) by mouth every 8 (eight) hours as needed for nausea or vomiting. 07/02/21   Eber Hong, MD  phenol (CHLORASEPTIC) 1.4 % LIQD Use as directed 1 spray in the mouth or throat as needed for throat irritation / pain. 08/10/21   Merrilee Jansky, MD  Probiotic Product (PROBIOTIC PO) Take 1 tablet by mouth daily.    [provider]  sertraline (ZOLOFT) 50 MG tablet Take 1 tablet (50 mg total) by mouth daily. 10/27/21   Sallyanne Kuster, NP  spironolactone (ALDACTONE) 25 MG tablet Take 1 tablet by mouth once daily 02/14/22   Sallyanne Kuster, NP  levocetirizine (XYZAL) 5 MG tablet Take 1 tablet (5 mg total) by mouth every evening. Patient not taking: No sig reported 07/31/20 08/04/20  Bing Neighbors, FNP      Allergies    Bee venom, Flavoring agent, and Cashew nut (anacardium occidentale) skin test    Review of Systems   Review of Systems  Cardiovascular:  Positive for chest pain.    Physical Exam Updated Vital Signs BP 122/78   Pulse 96   Temp 97.9 F (36.6 C) (Oral)   Resp 18   Ht 5\' 5"  (1.651 m)   Wt 93 kg   SpO2 99%   BMI 34.11 kg/m  Physical Exam  ED Results / Procedures / Treatments   Labs (all labs ordered are listed, but only abnormal results are displayed) Labs Reviewed  BASIC METABOLIC PANEL - Abnormal; Notable for the following components:      Result Value   CO2 21 (*)    Glucose, Bld 120 (*)    All other components within normal limits  CBC - Abnormal; Notable for the following components:   Platelets 408 (*)    All other components within normal limits  D-DIMER, QUANTITATIVE  POC URINE PREG, ED  TROPONIN I (HIGH SENSITIVITY)  TROPONIN I (HIGH SENSITIVITY)    EKG None  Radiology DG Chest 2 View  Result Date: 02/26/2022 CLINICAL DATA:  Chest pain EXAM: CHEST - 2 VIEW COMPARISON:  February 18, 2022 FINDINGS: The cardiomediastinal silhouette is normal in contour. No pleural effusion. No pneumothorax. No acute pleuroparenchymal  abnormality. Visualized abdomen is unremarkable. No acute osseous abnormality noted. IMPRESSION: No acute cardiopulmonary abnormality. Electronically Signed   By: Meda Klinefelter M.D.   On: 02/26/2022 15:11    Procedures Procedures  {Document cardiac monitor, telemetry assessment procedure when appropriate:1}  Medications Ordered in ED Medications - No data to display  ED Course/ Medical Decision Making/ A&P                           Medical Decision Making Amount and/or Complexity of Data Reviewed Labs: ordered. Radiology: ordered.   ***  {Document critical care time when appropriate:1} {Document review of labs and clinical decision tools ie heart score, Chads2Vasc2 etc:1}  {Document your independent review of radiology images, and any outside records:1} {Document your discussion with family members, caretakers, and with consultants:1} {Document social determinants of health affecting pt's care:1} {Document your decision making why or why not admission, treatments were needed:1} Final Clinical Impression(s) / ED Diagnoses Final diagnoses:  None    Rx / DC Orders ED Discharge Orders     None

## 2022-02-28 ENCOUNTER — Telehealth: Payer: Self-pay | Admitting: *Deleted

## 2022-02-28 NOTE — Patient Outreach (Signed)
  Care Coordination [facility] Note Transition Care Management Unsuccessful Follow-up Telephone Call  Date of discharge and from where:  02/26/22 from [facility]  Attempts:  1st Attempt  Reason for unsuccessful TCM follow-up call:  Unable to leave message   Estanislado Emms RN, BSN [place]   [facility] RN Care Coordinator

## 2022-03-07 ENCOUNTER — Other Ambulatory Visit: Payer: Self-pay | Admitting: Internal Medicine

## 2022-03-07 ENCOUNTER — Encounter: Payer: Self-pay | Admitting: Internal Medicine

## 2022-03-07 ENCOUNTER — Other Ambulatory Visit: Payer: Self-pay

## 2022-03-07 ENCOUNTER — Other Ambulatory Visit (HOSPITAL_COMMUNITY)
Admission: RE | Admit: 2022-03-07 | Discharge: 2022-03-07 | Disposition: A | Discharge status: Home / Self Care | Payer: No Typology Code available for payment source | Source: Ambulatory Visit | Attending: Internal Medicine | Admitting: Internal Medicine

## 2022-03-07 ENCOUNTER — Other Ambulatory Visit (INDEPENDENT_AMBULATORY_CARE_PROVIDER_SITE_OTHER): Payer: No Typology Code available for payment source

## 2022-03-07 ENCOUNTER — Ambulatory Visit: Payer: Medicaid Other | Attending: Internal Medicine | Admitting: Internal Medicine

## 2022-03-07 ENCOUNTER — Telehealth: Payer: Self-pay

## 2022-03-07 VITALS — BP 117/72 | HR 97 | Ht 65.0 in | Wt 211.4 lb

## 2022-03-07 DIAGNOSIS — R002 Palpitations: Secondary | ICD-10-CM | POA: Insufficient documentation

## 2022-03-07 DIAGNOSIS — E0590 Thyrotoxicosis, unspecified without thyrotoxic crisis or storm: Secondary | ICD-10-CM | POA: Insufficient documentation

## 2022-03-07 DIAGNOSIS — R7989 Other specified abnormal findings of blood chemistry: Secondary | ICD-10-CM

## 2022-03-07 LAB — T4, FREE: Free T4: 1.1 ng/dL (ref 0.61–1.12)

## 2022-03-07 LAB — TSH: TSH: <0.01 u[IU]/mL — ABNORMAL LOW (ref 0.350–4.500)

## 2022-03-07 MED ORDER — METOPROLOL TARTRATE 25 MG PO TABS: 25.0000 mg | ORAL_TABLET | Freq: Every day | ORAL | 3 refills | Status: DC | PRN

## 2022-03-07 MED ORDER — METOPROLOL TARTRATE 25 MG PO TABS: 25.0000 mg | ORAL_TABLET | Freq: Two times a day (BID) | ORAL | 3 refills | Status: DC

## 2022-03-07 NOTE — Telephone Encounter (Signed)
Patient notified, lab had enough serum  to run T3,T4.  Patient made aware,will copy pcp
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
## 2022-03-07 NOTE — Addendum Note (Signed)
Addended by: Leonides Schanz C on: 03/07/2022 04:20 PM   Modules accepted: Orders

## 2022-03-07 NOTE — Patient Instructions (Signed)
Medication Instructions:  START Metoprolol Tartrate (Lopressor) 25 mg 2 times a day after completing the ZIO monitor (03/28/22 or 03/29/22) *If you need a refill on your cardiac medications before your next appointment, please call your pharmacy*  Lab Work: Your physician recommends that you return for lab work TODAY:  TSH  If you have labs (blood work) drawn today and your tests are completely normal, you will receive your results only by: MyChart Message (if you have MyChart) OR A paper copy in the mail If you have any lab test that is abnormal or we need to change your treatment, we will call you to review the results.  Testing/Procedures: Your physician has requested that you have an echocardiogram. Echocardiography is a painless test that uses sound waves to create images of your heart. It provides your doctor with information about the size and shape of your heart and how well your heart's chambers and valves are working. This procedure takes approximately one hour. There are no restrictions for this procedure. Please do NOT wear cologne, perfume, aftershave, or lotions (deodorant is allowed). Please arrive 15 minutes prior to your appointment time.  ZIO XT- Long Term Monitor Instructions  Dr. Jenene Slicker has requested you wear a ZIO patch monitor for 14 days.  This is a single patch monitor. [employer] supplies one patch monitor per enrollment. Additional stickers are not available. Please do not apply patch if you will be having a Nuclear Stress Test,  Echocardiogram, Cardiac CT, MRI, or Chest Xray during the period you would be wearing the  monitor. The patch cannot be worn during these tests. You cannot remove and re-apply the  ZIO XT patch monitor.  Your ZIO patch monitor will be mailed 3 day USPS to your address on file. It may take 3-5 days  to receive your monitor after you have been enrolled.  Once you have received your monitor, please review the enclosed instructions. Your  monitor  has already been registered assigning a specific monitor serial # to you.  Billing and Patient Assistance Program Information  We have supplied [employer] with any of your insurance information on file for billing purposes. [employer] offers a sliding scale Patient Assistance Program for patients that do not have  insurance, or whose insurance does not completely cover the cost of the ZIO monitor.  You must apply for the Patient Assistance Program to qualify for this discounted rate.  To apply, please call [employer] at [phone], select option 4, select option 2, ask to apply for  Patient Assistance Program. Meredeth Ide will ask your household income, and how many people  are in your household. They will quote your out-of-pocket cost based on that information.  [employer] will also be able to set up a 32-month, interest-free payment plan if needed.  Applying the monitor   Shave hair from upper left chest.  Hold abrader disc by orange tab. Rub abrader in 40 strokes over the upper left chest as  indicated in your monitor instructions.  Clean area with 4 enclosed alcohol pads. Let dry.  Apply patch as indicated in monitor instructions. Patch will be placed under collarbone on left  side of chest with arrow pointing upward.  Rub patch adhesive wings for 2 minutes. Remove white label marked "1". Remove the white  label marked "2". Rub patch adhesive wings for 2 additional minutes.  While looking in a mirror, press and release button in center of patch. A small green light will  flash 3-4 times. This will be  your only indicator that the monitor has been turned on.  Do not shower for the first 24 hours. You may shower after the first 24 hours.  Press the button if you feel a symptom. You will hear a small click. Record Date, Time and  Symptom in the Patient Logbook.  When you are ready to remove the patch, follow instructions on the last 2 pages of Patient  Logbook. Stick patch monitor onto the  last page of Patient Logbook.  Place Patient Logbook in the blue and white box. Use locking tab on box and tape box closed  securely. The blue and white box has prepaid postage on it. Please place it in the mailbox as  soon as possible. Your physician should have your test results approximately 7 days after the  monitor has been mailed back to [facility].  Call [facility] Customer Care at [phone] if you have questions regarding  your ZIO XT patch monitor. Call them immediately if you see an orange light blinking on your  monitor.  If your monitor falls off in less than 4 days, contact our Monitor department at [phone].  If your monitor becomes loose or falls off after 4 days call [employer] at [phone] for  suggestions on securing your monitor    Follow-Up: At [facility], you and your health needs are our priority.  As part of our continuing mission to provide you with exceptional heart care, we have created designated Provider Care Teams.  These Care Teams include your primary Cardiologist (physician) and Advanced Practice Providers (APPs -  Physician Assistants and Nurse Practitioners) who all work together to provide you with the care you need, when you need it.  We recommend signing up for the patient portal called "MyChart".  Sign up information is provided on this After Visit Summary.  MyChart is used to connect with patients for Virtual Visits (Telemedicine).  Patients are able to view lab/test results, encounter notes, upcoming appointments, etc.  Non-urgent messages can be sent to your provider as well.   To learn more about what you can do with MyChart, go to [email or web address].    Your next appointment:   3 month(s)  The format for your next appointment:   In Person  Provider:   Luane School, MD    Other Instructions  Important Information About Sugar

## 2022-03-07 NOTE — Telephone Encounter (Signed)
-----   Message from Marjo Bicker, MD sent at 03/07/2022 12:37 PM EST ----- TSH is extremely low. Obtain T3 and T4. This might explain her symptoms of palpitations. She will need to follow up with her PCP for further evaluation of abnormal thyroid function test.

## 2022-03-07 NOTE — Telephone Encounter (Signed)
Patient notified and verbalized understanding. Patient follows up with PCP on 12/28 and will discuss abnormal thyroid. PCP copied.

## 2022-03-07 NOTE — Telephone Encounter (Signed)
-----   Message from Vishnu P Mallipeddi, MD sent at 03/07/2022 12:37 PM EST ----- TSH is extremely low. Obtain T3 and T4. This might explain her symptoms of palpitations. She will need to follow up with her PCP for further evaluation of abnormal thyroid function test. 

## 2022-03-08 ENCOUNTER — Ambulatory Visit (INDEPENDENT_AMBULATORY_CARE_PROVIDER_SITE_OTHER): Payer: Medicaid Other | Admitting: Physician Assistant

## 2022-03-08 ENCOUNTER — Encounter: Payer: Self-pay | Admitting: Physician Assistant

## 2022-03-08 VITALS — BP 125/69 | HR 103 | Temp 97.6°F | Resp 16 | Ht 65.0 in | Wt 209.0 lb

## 2022-03-08 DIAGNOSIS — I10 Essential (primary) hypertension: Secondary | ICD-10-CM

## 2022-03-08 DIAGNOSIS — R002 Palpitations: Secondary | ICD-10-CM | POA: Diagnosis not present

## 2022-03-08 DIAGNOSIS — E559 Vitamin D deficiency, unspecified: Secondary | ICD-10-CM | POA: Diagnosis not present

## 2022-03-08 DIAGNOSIS — R7989 Other specified abnormal findings of blood chemistry: Secondary | ICD-10-CM

## 2022-03-08 DIAGNOSIS — E538 Deficiency of other specified B group vitamins: Secondary | ICD-10-CM | POA: Diagnosis not present

## 2022-03-08 LAB — T3, FREE: T3, Free: 3.9 pg/mL (ref 2.0–4.4)
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
## 2022-03-12 DIAGNOSIS — Z419 Encounter for procedure for purposes other than remedying health state, unspecified: Secondary | ICD-10-CM | POA: Diagnosis not present

## 2022-03-13 ENCOUNTER — Other Ambulatory Visit (HOSPITAL_COMMUNITY)
Admission: RE | Admit: 2022-03-13 | Discharge: 2022-03-13 | Disposition: A | Discharge status: Home / Self Care | Payer: No Typology Code available for payment source | Source: Ambulatory Visit | Attending: Physician Assistant | Admitting: Physician Assistant

## 2022-03-13 DIAGNOSIS — R7989 Other specified abnormal findings of blood chemistry: Secondary | ICD-10-CM | POA: Diagnosis not present

## 2022-03-13 DIAGNOSIS — R002 Palpitations: Secondary | ICD-10-CM | POA: Insufficient documentation

## 2022-03-13 DIAGNOSIS — R946 Abnormal results of thyroid function studies: Secondary | ICD-10-CM | POA: Diagnosis not present

## 2022-03-13 LAB — CBC WITH DIFFERENTIAL/PLATELET
Abs Immature Granulocytes: 0.03 10*3/uL (ref 0.00–0.07)
Basophils Absolute: 0.1 10*3/uL (ref 0.0–0.1)
Basophils Relative: 1 %
Eosinophils Absolute: 0.4 10*3/uL (ref 0.0–0.5)
Eosinophils Relative: 5 %
HCT: 38.1 % (ref 36.0–46.0)
Hemoglobin: 12.6 g/dL (ref 12.0–15.0)
Immature Granulocytes: 0 %
Lymphocytes Relative: 39 %
Lymphs Abs: 2.8 10*3/uL (ref 0.7–4.0)
MCH: 27.5 pg (ref 26.0–34.0)
MCHC: 33.1 g/dL (ref 30.0–36.0)
MCV: 83.2 fL (ref 80.0–100.0)
Monocytes Absolute: 0.5 10*3/uL (ref 0.1–1.0)
Monocytes Relative: 6 %
Neutro Abs: 3.5 10*3/uL (ref 1.7–7.7)
Neutrophils Relative %: 49 %
Platelets: 390 10*3/uL (ref 150–400)
RBC: 4.58 MIL/uL (ref 3.87–5.11)
RDW: 12.2 % (ref 11.5–15.5)
WBC: 7.3 10*3/uL (ref 4.0–10.5)
nRBC: 0 % (ref 0.0–0.2)

## 2022-03-13 LAB — VITAMIN B12: Vitamin B-12: 307 pg/mL (ref 180–914)

## 2022-03-13 LAB — FOLATE: Folate: 16.4 ng/mL (ref >5.9)

## 2022-03-13 LAB — TSH: TSH: 0.028 u[IU]/mL — ABNORMAL LOW (ref 0.350–4.500)

## 2022-03-13 LAB — T4, FREE: Free T4: 0.97 ng/dL (ref 0.61–1.12)

## 2022-03-13 LAB — VITAMIN D 25 HYDROXY (VIT D DEFICIENCY, FRACTURES): Vit D, 25-Hydroxy: 19.91 ng/mL — ABNORMAL LOW (ref 30–100)

## 2022-03-14 LAB — T3, FREE: T3, Free: 3.6 pg/mL (ref 2.0–4.4)

## 2022-03-21 ENCOUNTER — Ambulatory Visit
Admission: RE | Admit: 2022-03-21 | Discharge: 2022-03-21 | Disposition: A | Discharge status: Home / Self Care | Payer: Medicaid Other | Source: Ambulatory Visit | Attending: Physician Assistant | Admitting: Physician Assistant

## 2022-03-21 ENCOUNTER — Ambulatory Visit
Admission: RE | Admit: 2022-03-21 | Discharge: 2022-03-21 | Disposition: A | Discharge status: Home / Self Care | Payer: Medicaid Other | Source: Ambulatory Visit | Attending: Internal Medicine | Admitting: Internal Medicine

## 2022-03-21 ENCOUNTER — Encounter: Payer: Self-pay | Admitting: Nurse Practitioner

## 2022-03-21 ENCOUNTER — Ambulatory Visit: Payer: Medicaid Other | Admitting: Nurse Practitioner

## 2022-03-21 VITALS — BP 137/79 | HR 108 | Temp 98.5°F | Resp 16 | Ht 65.0 in | Wt 211.2 lb

## 2022-03-21 DIAGNOSIS — R7989 Other specified abnormal findings of blood chemistry: Secondary | ICD-10-CM

## 2022-03-21 DIAGNOSIS — E559 Vitamin D deficiency, unspecified: Secondary | ICD-10-CM

## 2022-03-21 DIAGNOSIS — Z6835 Body mass index (BMI) 35.0-35.9, adult: Secondary | ICD-10-CM

## 2022-03-21 DIAGNOSIS — F419 Anxiety disorder, unspecified: Secondary | ICD-10-CM

## 2022-03-21 DIAGNOSIS — E0590 Thyrotoxicosis, unspecified without thyrotoxic crisis or storm: Secondary | ICD-10-CM | POA: Diagnosis not present

## 2022-03-21 DIAGNOSIS — E049 Nontoxic goiter, unspecified: Secondary | ICD-10-CM | POA: Diagnosis not present

## 2022-03-21 DIAGNOSIS — E119 Type 2 diabetes mellitus without complications: Secondary | ICD-10-CM

## 2022-03-21 DIAGNOSIS — F32A Depression, unspecified: Secondary | ICD-10-CM

## 2022-03-21 DIAGNOSIS — R002 Palpitations: Secondary | ICD-10-CM

## 2022-03-21 DIAGNOSIS — I10 Essential (primary) hypertension: Secondary | ICD-10-CM

## 2022-03-21 LAB — ECHOCARDIOGRAM COMPLETE
AR max vel: 3.07 cm2
AV Area VTI: 3.19 cm2
AV Area mean vel: 3.37 cm2
AV Mean grad: 9 mmHg
AV Peak grad: 14 mmHg
Ao pk vel: 1.87 m/s
Area-P 1/2: 2.56 cm2
S' Lateral: 2.1 cm

## 2022-03-21 MED ORDER — TIRZEPATIDE 5 MG/0.5ML ~~LOC~~ SOAJ: 5.0000 mg | SUBCUTANEOUS | 1 refills | Status: DC

## 2022-03-21 MED ORDER — VITAMIN D (ERGOCALCIFEROL) 1.25 MG (50000 UNIT) PO CAPS: 50000.0000 [IU] | ORAL_CAPSULE | ORAL | 1 refills | Status: DC

## 2022-03-21 MED ORDER — PROPRANOLOL HCL 10 MG PO TABS: 10.0000 mg | ORAL_TABLET | Freq: Three times a day (TID) | ORAL | 0 refills | Status: DC | PRN

## 2022-03-21 NOTE — Progress Notes (Signed)
*  PRELIMINARY RESULTS* Echocardiogram 2D Echocardiogram has been performed.  Monique Forbes 03/21/2022, 9:20 AM
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
## 2022-03-22 ENCOUNTER — Encounter: Payer: Self-pay | Admitting: Nurse Practitioner

## 2022-04-03 ENCOUNTER — Telehealth: Payer: Self-pay

## 2022-04-03 NOTE — Telephone Encounter (Signed)
Pt called that she think she missed her Mounjaro yesterday she was injecting as per alyssa don't take again its ok to not take again

## 2022-04-04 ENCOUNTER — Ambulatory Visit
Admission: EM | Admit: 2022-04-04 | Discharge: 2022-04-04 | Discharge status: Against Medical Advice | Payer: Medicaid Other

## 2022-04-04 DIAGNOSIS — J060 Acute laryngopharyngitis: Secondary | ICD-10-CM | POA: Diagnosis not present

## 2022-04-04 DIAGNOSIS — R030 Elevated blood-pressure reading, without diagnosis of hypertension: Secondary | ICD-10-CM | POA: Diagnosis not present

## 2022-04-12 DIAGNOSIS — Z419 Encounter for procedure for purposes other than remedying health state, unspecified: Secondary | ICD-10-CM | POA: Diagnosis not present

## 2022-04-19 ENCOUNTER — Encounter: Payer: Self-pay | Admitting: Nurse Practitioner

## 2022-04-19 ENCOUNTER — Ambulatory Visit: Payer: Medicaid Other | Admitting: Nurse Practitioner

## 2022-04-19 VITALS — BP 130/81 | HR 90 | Temp 98.3°F | Resp 16 | Ht 65.0 in | Wt 205.0 lb

## 2022-04-19 DIAGNOSIS — E1165 Type 2 diabetes mellitus with hyperglycemia: Secondary | ICD-10-CM

## 2022-04-19 DIAGNOSIS — R7989 Other specified abnormal findings of blood chemistry: Secondary | ICD-10-CM | POA: Diagnosis not present

## 2022-04-19 DIAGNOSIS — E782 Mixed hyperlipidemia: Secondary | ICD-10-CM | POA: Diagnosis not present

## 2022-04-19 MED ORDER — METFORMIN HCL ER 500 MG PO TB24: 1000.0000 mg | ORAL_TABLET | Freq: Every day | ORAL | 1 refills | Status: DC
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
## 2022-04-20 ENCOUNTER — Other Ambulatory Visit (HOSPITAL_COMMUNITY)
Admission: RE | Admit: 2022-04-20 | Discharge: 2022-04-20 | Disposition: A | Discharge status: Home / Self Care | Payer: Medicaid Other | Source: Ambulatory Visit | Attending: Nurse Practitioner | Admitting: Nurse Practitioner

## 2022-04-20 DIAGNOSIS — R7989 Other specified abnormal findings of blood chemistry: Secondary | ICD-10-CM | POA: Diagnosis not present

## 2022-04-20 LAB — LIPID PANEL
Cholesterol: 136 mg/dL (ref 0–200)
HDL: 37 mg/dL — ABNORMAL LOW (ref >40)
LDL Cholesterol: 50 mg/dL (ref 0–99)
Total CHOL/HDL Ratio: 3.7 RATIO
Triglycerides: 243 mg/dL — ABNORMAL HIGH (ref <150)
VLDL: 49 mg/dL — ABNORMAL HIGH (ref 0–40)

## 2022-04-20 LAB — T4, FREE: Free T4: 0.77 ng/dL (ref 0.61–1.12)

## 2022-04-20 LAB — TSH: TSH: 3.915 u[IU]/mL (ref 0.350–4.500)

## 2022-04-20 LAB — HEMOGLOBIN A1C
Hgb A1c MFr Bld: 5.4 % (ref 4.8–5.6)
Mean Plasma Glucose: 108.28 mg/dL

## 2022-04-22 LAB — THYROID PEROXIDASE ANTIBODY: Thyroperoxidase Ab SerPl-aCnc: <9 IU/mL (ref 0–34)

## 2022-04-22 LAB — T3 UPTAKE: T3 Uptake Ratio: 25 % (ref 24–39)

## 2022-04-24 ENCOUNTER — Telehealth: Payer: Self-pay

## 2022-04-24 NOTE — Telephone Encounter (Signed)
As per alyssa decrease metformin to 1 tab po daily with supper or lunch with any meal and continue Mounjaro as prescribed

## 2022-04-28 ENCOUNTER — Encounter: Payer: Self-pay | Admitting: Nurse Practitioner

## 2022-05-01 ENCOUNTER — Telehealth (INDEPENDENT_AMBULATORY_CARE_PROVIDER_SITE_OTHER): Payer: Medicaid Other | Admitting: Internal Medicine

## 2022-05-01 ENCOUNTER — Telehealth: Payer: Self-pay

## 2022-05-01 ENCOUNTER — Encounter: Payer: Self-pay | Admitting: Internal Medicine

## 2022-05-01 ENCOUNTER — Telehealth: Payer: Self-pay | Admitting: Internal Medicine

## 2022-05-01 VITALS — Temp 102.5°F | Ht 65.0 in | Wt 205.0 lb

## 2022-05-01 DIAGNOSIS — J111 Influenza due to unidentified influenza virus with other respiratory manifestations: Secondary | ICD-10-CM

## 2022-05-01 DIAGNOSIS — J4541 Moderate persistent asthma with (acute) exacerbation: Secondary | ICD-10-CM

## 2022-05-01 MED ORDER — AZITHROMYCIN 250 MG PO TABS: ORAL_TABLET | ORAL | 0 refills | Status: DC

## 2022-05-01 MED ORDER — FLUTICASONE-SALMETEROL 100-50 MCG/ACT IN AEPB: 1.0000 | INHALATION_SPRAY | Freq: Two times a day (BID) | RESPIRATORY_TRACT | 3 refills | Status: DC

## 2022-05-01 MED ORDER — OSELTAMIVIR PHOSPHATE 75 MG PO CAPS: 75.0000 mg | ORAL_CAPSULE | Freq: Two times a day (BID) | ORAL | 0 refills | Status: DC

## 2022-05-01 NOTE — Telephone Encounter (Signed)
[name] to call us back we can change her appt

## 2022-05-01 NOTE — Telephone Encounter (Signed)
Work note emailed to [employer]
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
## 2022-05-02 ENCOUNTER — Telehealth: Payer: Self-pay | Admitting: Nurse Practitioner

## 2022-05-02 ENCOUNTER — Telehealth: Payer: Medicaid Other | Admitting: Nurse Practitioner

## 2022-05-02 ENCOUNTER — Ambulatory Visit: Payer: Medicaid Other | Admitting: Nurse Practitioner

## 2022-05-02 NOTE — Telephone Encounter (Signed)
MR (144 pgs) mailed to [facility]: [address], [place]

## 2022-05-03 ENCOUNTER — Telehealth: Payer: Medicaid Other | Admitting: Nurse Practitioner

## 2022-05-03 ENCOUNTER — Encounter: Payer: Self-pay | Admitting: Nurse Practitioner

## 2022-05-03 VITALS — Ht 65.0 in | Wt 205.0 lb

## 2022-05-03 DIAGNOSIS — J111 Influenza due to unidentified influenza virus with other respiratory manifestations: Secondary | ICD-10-CM

## 2022-05-03 DIAGNOSIS — T753XXA Motion sickness, initial encounter: Secondary | ICD-10-CM

## 2022-05-03 DIAGNOSIS — E782 Mixed hyperlipidemia: Secondary | ICD-10-CM

## 2022-05-03 DIAGNOSIS — E119 Type 2 diabetes mellitus without complications: Secondary | ICD-10-CM | POA: Diagnosis not present

## 2022-05-03 DIAGNOSIS — R7989 Other specified abnormal findings of blood chemistry: Secondary | ICD-10-CM | POA: Diagnosis not present

## 2022-05-03 MED ORDER — MECLIZINE HCL 12.5 MG PO TABS: 12.5000 mg | ORAL_TABLET | Freq: Three times a day (TID) | ORAL | 1 refills | Status: DC | PRN

## 2022-05-03 MED ORDER — OSELTAMIVIR PHOSPHATE 75 MG PO CAPS: 75.0000 mg | ORAL_CAPSULE | Freq: Two times a day (BID) | ORAL | 0 refills | Status: DC
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
## 2022-05-11 ENCOUNTER — Telehealth: Payer: Self-pay

## 2022-05-11 ENCOUNTER — Other Ambulatory Visit: Payer: Self-pay

## 2022-05-11 DIAGNOSIS — I10 Essential (primary) hypertension: Secondary | ICD-10-CM

## 2022-05-11 DIAGNOSIS — Z419 Encounter for procedure for purposes other than remedying health state, unspecified: Secondary | ICD-10-CM | POA: Diagnosis not present

## 2022-05-11 MED ORDER — AMLODIPINE-OLMESARTAN 10-20 MG PO TABS: 1.0000 | ORAL_TABLET | Freq: Every day | ORAL | 2 refills | Status: DC

## 2022-05-11 MED ORDER — SCOPOLAMINE 1 MG/3DAYS TD PT72: 1.0000 | MEDICATED_PATCH | TRANSDERMAL | 4 refills | Status: DC

## 2022-05-11 NOTE — Telephone Encounter (Signed)
[name] that alyssa send patch to her phar stopped meclizine

## 2022-05-13 ENCOUNTER — Other Ambulatory Visit: Payer: Self-pay

## 2022-05-13 ENCOUNTER — Emergency Department (HOSPITAL_COMMUNITY)
Admission: EM | Admit: 2022-05-13 | Discharge: 2022-05-13 | Disposition: A | Discharge status: Home / Self Care | Payer: Medicaid Other | Attending: Emergency Medicine | Admitting: Emergency Medicine

## 2022-05-13 ENCOUNTER — Encounter (HOSPITAL_COMMUNITY): Payer: Self-pay

## 2022-05-13 ENCOUNTER — Emergency Department (HOSPITAL_COMMUNITY): Payer: Medicaid Other

## 2022-05-13 DIAGNOSIS — E119 Type 2 diabetes mellitus without complications: Secondary | ICD-10-CM | POA: Diagnosis not present

## 2022-05-13 DIAGNOSIS — Z7951 Long term (current) use of inhaled steroids: Secondary | ICD-10-CM | POA: Diagnosis not present

## 2022-05-13 DIAGNOSIS — I10 Essential (primary) hypertension: Secondary | ICD-10-CM | POA: Insufficient documentation

## 2022-05-13 DIAGNOSIS — W109XXA Fall (on) (from) unspecified stairs and steps, initial encounter: Secondary | ICD-10-CM | POA: Diagnosis not present

## 2022-05-13 DIAGNOSIS — Z79899 Other long term (current) drug therapy: Secondary | ICD-10-CM | POA: Insufficient documentation

## 2022-05-13 DIAGNOSIS — R200 Anesthesia of skin: Secondary | ICD-10-CM | POA: Insufficient documentation

## 2022-05-13 DIAGNOSIS — M542 Cervicalgia: Secondary | ICD-10-CM | POA: Diagnosis not present

## 2022-05-13 DIAGNOSIS — S161XXA Strain of muscle, fascia and tendon at neck level, initial encounter: Secondary | ICD-10-CM | POA: Insufficient documentation

## 2022-05-13 DIAGNOSIS — R519 Headache, unspecified: Secondary | ICD-10-CM | POA: Diagnosis not present

## 2022-05-13 DIAGNOSIS — W19XXXA Unspecified fall, initial encounter: Secondary | ICD-10-CM

## 2022-05-13 DIAGNOSIS — J45909 Unspecified asthma, uncomplicated: Secondary | ICD-10-CM | POA: Insufficient documentation

## 2022-05-13 DIAGNOSIS — S0990XA Unspecified injury of head, initial encounter: Secondary | ICD-10-CM | POA: Insufficient documentation

## 2022-05-13 LAB — CBC WITH DIFFERENTIAL/PLATELET
Abs Immature Granulocytes: 0.04 10*3/uL (ref 0.00–0.07)
Basophils Absolute: 0.1 10*3/uL (ref 0.0–0.1)
Basophils Relative: 1 %
Eosinophils Absolute: 0.5 10*3/uL (ref 0.0–0.5)
Eosinophils Relative: 6 %
HCT: 37.5 % (ref 36.0–46.0)
Hemoglobin: 12.4 g/dL (ref 12.0–15.0)
Immature Granulocytes: 1 %
Lymphocytes Relative: 42 %
Lymphs Abs: 3.4 10*3/uL (ref 0.7–4.0)
MCH: 27.9 pg (ref 26.0–34.0)
MCHC: 33.1 g/dL (ref 30.0–36.0)
MCV: 84.3 fL (ref 80.0–100.0)
Monocytes Absolute: 0.5 10*3/uL (ref 0.1–1.0)
Monocytes Relative: 6 %
Neutro Abs: 3.6 10*3/uL (ref 1.7–7.7)
Neutrophils Relative %: 44 %
Platelets: 390 10*3/uL (ref 150–400)
RBC: 4.45 MIL/uL (ref 3.87–5.11)
RDW: 12.8 % (ref 11.5–15.5)
WBC: 8.2 10*3/uL (ref 4.0–10.5)
nRBC: 0 % (ref 0.0–0.2)

## 2022-05-13 LAB — BASIC METABOLIC PANEL
Anion gap: 10 (ref 5–15)
BUN: 20 mg/dL (ref 6–20)
CO2: 26 mmol/L (ref 22–32)
Calcium: 9.1 mg/dL (ref 8.9–10.3)
Chloride: 101 mmol/L (ref 98–111)
Creatinine, Ser: 0.73 mg/dL (ref 0.44–1.00)
GFR, Estimated: >60 mL/min (ref >60)
Glucose, Bld: 124 mg/dL — ABNORMAL HIGH (ref 70–99)
Potassium: 3 mmol/L — ABNORMAL LOW (ref 3.5–5.1)
Sodium: 137 mmol/L (ref 135–145)

## 2022-05-13 NOTE — Discharge Instructions (Addendum)
ET head and neck without any acute findings.  Expect to be sore and stiff for the next few days.  Make an appointment to follow-up with your regular doctor.  Would recommend extra strength Tylenol and Motrin as needed for the pain.  Return for any new or worse symptoms.  Return if any of the numbness on the left side reoccurs or if you develop any weakness.

## 2022-05-13 NOTE — ED Provider Notes (Signed)
[facility] Provider Note   CSN: [id number] Arrival date & time: 05/13/22  [id number]     History  Chief Complaint  Patient presents with   Fall    Monique Forbes is a 32 y.o. female.  Patient just prior to arrival had a fall down the stairs.  She tripped over her dog and fell down the steps and hit a wall.  Patient with complaint of neck pain head pain but no upper back or lower back pain and has numbness and tingling to the left side left arm left lower extremity left side of body as well as left side of face.  Patient states she had a brief period of loss of consciousness.  No vision changes no nausea or vomiting.  Also no weakness.  No incontinence.  Past medical history significant for diabetes asthma polycystic ovarian syndrome hypertension posttraumatic stress disorder anxiety depression hyperlipidemia borderline personality disorder.       Home Medications Prior to Admission medications   Medication Sig Start Date End Date Taking? Authorizing Provider  albuterol (VENTOLIN HFA) 108 (90 Base) MCG/ACT inhaler Inhale 2 puffs into the lungs every 6 (six) hours as needed for wheezing or shortness of breath. 12/14/21   Leath-Warren, Alda Lea, NP  amlodipine-olmesartan (AZOR) 10-20 MG tablet Take 1 tablet by mouth daily. 05/11/22   [name], NP  azithromycin (ZITHROMAX) 250 MG tablet Take one tab a day for 10 days for uri 05/01/22   Lavera Guise, MD  EPINEPHrine (EPIPEN 2-PAK) 0.3 mg/0.3 mL IJ SOAJ injection Inject 0.3 mg into the muscle as needed for anaphylaxis. 06/14/21   [name], NP  fluticasone-salmeterol (ADVAIR) 100-50 MCG/ACT AEPB Inhale 1 puff into the lungs 2 (two) times daily. Rinse mouth afterwards 05/01/22   Lavera Guise, MD  hydrochlorothiazide (HYDRODIURIL) 12.5 MG tablet Take 1 tablet (12.5 mg total) by mouth daily. 02/22/22   [name], NP  meclizine (ANTIVERT) 12.5 MG tablet Take 1 tablet (12.5 mg  total) by mouth 3 (three) times daily as needed for dizziness. 05/03/22   [name], NP  omeprazole (PRILOSEC) 40 MG capsule Take 1 capsule (40 mg total) by mouth 2 (two) times daily. 11/09/21   Lucilla Lame, MD  oseltamivir (TAMIFLU) 75 MG capsule Take 1 capsule (75 mg total) by mouth 2 (two) times daily. 05/03/22   [name], NP  Probiotic Product (PROBIOTIC PO) Take 1 tablet by mouth daily.    [provider]  propranolol (INDERAL) 10 MG tablet Take 1 tablet (10 mg total) by mouth 3 (three) times daily as needed (palpitations/tachycardia and/or anxiety). 03/21/22   [name], NP  scopolamine (TRANSDERM-SCOP) 1 MG/3DAYS Place 1 patch (1.5 mg total) onto the skin every 3 (three) days. 05/11/22   [name], NP  sertraline (ZOLOFT) 50 MG tablet Take 1 tablet (50 mg total) by mouth daily. 10/27/21   [name], NP  tirzepatide [facility]) 5 MG/0.5ML Pen Inject 5 mg into the skin once a week. 03/21/22   [name], NP  Vitamin D, Ergocalciferol, (DRISDOL) 1.25 MG (50000 UNIT) CAPS capsule Take 1 capsule (50,000 Units total) by mouth every 7 (seven) days. 03/21/22   [name], NP      Allergies    Bee venom, Flavoring agent, and Cashew nut (anacardium occidentale) skin test    Review of Systems   Review of Systems  Constitutional:  Negative for chills and fever.  HENT:  Negative for ear pain and sore  throat.   Eyes:  Negative for photophobia, pain and visual disturbance.  Respiratory:  Negative for cough and shortness of breath.   Cardiovascular:  Negative for chest pain and palpitations.  Gastrointestinal:  Negative for abdominal pain and vomiting.  Genitourinary:  Negative for dysuria and hematuria.  Musculoskeletal:  Positive for neck pain. Negative for arthralgias and back pain.  Skin:  Negative for color change and rash.  Neurological:  Positive for numbness and headaches. Negative for tremors, seizures, syncope, speech difficulty,  weakness and light-headedness.  All other systems reviewed and are negative.   Physical Exam Updated Vital Signs BP 114/71   Pulse 84   Temp 98 F (36.7 C) (Oral)   Resp 18   Ht 1.651 m ('5\' 5"'$ )   Wt 93 kg   LMP 02/27/2022 (Exact Date)   SpO2 97%   BMI 34.11 kg/m  Physical Exam Vitals and nursing note reviewed.  Constitutional:      General: She is not in acute distress.    Appearance: Normal appearance. She is well-developed. She is not ill-appearing or toxic-appearing.  HENT:     Head: Normocephalic and atraumatic.  Eyes:     Extraocular Movements: Extraocular movements intact.     Conjunctiva/sclera: Conjunctivae normal.     Pupils: Pupils are equal, round, and reactive to light.  Neck:     Comments: Tenderness to palpation to posterior cervical spine. Cardiovascular:     Rate and Rhythm: Normal rate and regular rhythm.     Heart sounds: No murmur heard. Pulmonary:     Effort: Pulmonary effort is normal. No respiratory distress.     Breath sounds: Normal breath sounds.  Abdominal:     Palpations: Abdomen is soft.     Tenderness: There is no abdominal tenderness.  Musculoskeletal:        General: No swelling.     Cervical back: Normal range of motion. Tenderness present.  Skin:    General: Skin is warm and dry.     Capillary Refill: Capillary refill takes less than 2 seconds.  Neurological:     General: No focal deficit present.     Mental Status: She is alert and oriented to person, place, and time.     Cranial Nerves: Cranial nerve deficit present.     Sensory: Sensory deficit present.     Motor: No weakness.     Comments: Patient with subjective decrease sensation to the left side of face left upper extremity left lower extremity.  No weakness.  No speech difficulties.  No focal field deficits.  Psychiatric:        Mood and Affect: Mood normal.     ED Results / Procedures / Treatments   Labs (all labs ordered are listed, but only abnormal results are  displayed) Labs Reviewed  BASIC METABOLIC PANEL - Abnormal; Notable for the following components:      Result Value   Potassium 3.0 (*)    Glucose, Bld 124 (*)    All other components within normal limits  CBC WITH DIFFERENTIAL/PLATELET  I-STAT BETA HCG BLOOD, ED ([facility], [facility], [facility] ONLY)    EKG None  Radiology CT Head Wo Contrast  Result Date: 05/13/2022 CLINICAL DATA:  Fall.  Pain.  Sudden left-sided numbness/tingling. EXAM: CT HEAD WITHOUT CONTRAST CT CERVICAL SPINE WITHOUT CONTRAST TECHNIQUE: Multidetector CT imaging of the head and cervical spine was performed following the standard protocol without intravenous contrast. Multiplanar CT image reconstructions of the cervical spine were also generated. RADIATION  DOSE REDUCTION: This exam was performed according to the departmental dose-optimization program which includes automated exposure control, adjustment of the mA and/or kV according to patient size and/or use of iterative reconstruction technique. COMPARISON:  None Available. FINDINGS: CT HEAD FINDINGS Brain: No evidence of acute infarction, hemorrhage, hydrocephalus, extra-axial collection or mass lesion/mass effect. Vascular: No hyperdense vessel or unexpected calcification. Skull: Normal. Negative for fracture or focal lesion. Sinuses/Orbits: No acute finding. Other: None. CT CERVICAL SPINE FINDINGS Alignment: Mild reversal of normal lordosis centered at C4. No other malalignment. Skull base and vertebrae: No acute fracture. No primary bone lesion or focal pathologic process. Soft tissues and spinal canal: No prevertebral fluid or swelling. No visible canal hematoma. Disc levels:  No significant degenerative changes. Upper chest: Negative. Other: No other abnormalities. IMPRESSION: 1. No acute intracranial abnormalities. 2. No post traumatic malalignment.  No fracture. Electronically Signed   By: Dorise Bullion III M.D.   On: 05/13/2022 09:56   CT Cervical Spine Wo Contrast  Result Date:  05/13/2022 CLINICAL DATA:  Fall.  Pain.  Sudden left-sided numbness/tingling. EXAM: CT HEAD WITHOUT CONTRAST CT CERVICAL SPINE WITHOUT CONTRAST TECHNIQUE: Multidetector CT imaging of the head and cervical spine was performed following the standard protocol without intravenous contrast. Multiplanar CT image reconstructions of the cervical spine were also generated. RADIATION DOSE REDUCTION: This exam was performed according to the departmental dose-optimization program which includes automated exposure control, adjustment of the mA and/or kV according to patient size and/or use of iterative reconstruction technique. COMPARISON:  None Available. FINDINGS: CT HEAD FINDINGS Brain: No evidence of acute infarction, hemorrhage, hydrocephalus, extra-axial collection or mass lesion/mass effect. Vascular: No hyperdense vessel or unexpected calcification. Skull: Normal. Negative for fracture or focal lesion. Sinuses/Orbits: No acute finding. Other: None. CT CERVICAL SPINE FINDINGS Alignment: Mild reversal of normal lordosis centered at C4. No other malalignment. Skull base and vertebrae: No acute fracture. No primary bone lesion or focal pathologic process. Soft tissues and spinal canal: No prevertebral fluid or swelling. No visible canal hematoma. Disc levels:  No significant degenerative changes. Upper chest: Negative. Other: No other abnormalities. IMPRESSION: 1. No acute intracranial abnormalities. 2. No post traumatic malalignment.  No fracture. Electronically Signed   By: Dorise Bullion III M.D.   On: 05/13/2022 09:56    Procedures Procedures    Medications Ordered in ED Medications - No data to display  ED Course/ Medical Decision Making/ A&P                             Medical Decision Making Amount and/or Complexity of Data Reviewed Labs: ordered. Radiology: ordered.  Patient's numbness to the left side of the face and left upper extremity left lower extremity completely resolved spontaneously.  CT  head and neck without any acute findings.  CBC and basic metabolic panel just significant for potassium of 3.0 renal function normal.  No leukocytosis hemoglobin 12.4.  Point-of-care pregnancy test was negative.  Was initially concerned about in her cranial brain injury with the face being involved cervical injury would not explain the face numbness.  But could explain numbness to the left upper extremity left lower extremity.  But since everything resolved and is now completely gone do not feel that patient needs to be transferred for MRI for further evaluation.  Will have patient follow-up with primary care provider.  Final Clinical Impression(s) / ED Diagnoses Final diagnoses:  Fall, initial encounter  Injury of head, initial  encounter  Cervical strain, acute, initial encounter    Rx / DC Orders ED Discharge Orders     None         Fredia Sorrow, MD 05/13/22 1041

## 2022-05-13 NOTE — ED Triage Notes (Addendum)
Patient comes to the ED stating she tripped over the dog and fell down the steps into the wall. Patient reports hitting her head, upper neck, and all of a sudden left side numbness/ tingling. Patient states left side tingling sensation from the face to her legs. Patient states nausea and denies vision changes.

## 2022-05-13 NOTE — ED Notes (Signed)
RN does not have an ISTAT tube for order. Notified MD. CMP has already resulted.

## 2022-05-14 ENCOUNTER — Telehealth: Payer: Self-pay

## 2022-05-14 MED ORDER — METOPROLOL TARTRATE 25 MG PO TABS: 25.0000 mg | ORAL_TABLET | Freq: Two times a day (BID) | ORAL | 3 refills | Status: DC

## 2022-05-14 NOTE — Telephone Encounter (Signed)
Results discussed with patient.she will stop HCTZ and propranolol. She will start lopressor 25 mg bid

## 2022-05-14 NOTE — Telephone Encounter (Signed)
-----   Message from Chalmers Guest, MD sent at 05/14/2022  8:17 AM EST ----- Event monitor showed symptoms correlating with sinus rhythm, >90 bpm. Stop propranolol and HCTZ. Start metoprolol tartrate 25 mg twice daily for symptomatic relief.

## 2022-05-15 ENCOUNTER — Encounter: Payer: Self-pay | Admitting: Nurse Practitioner

## 2022-05-15 ENCOUNTER — Telehealth: Payer: Self-pay

## 2022-05-15 NOTE — Transitions of Care (Post Inpatient/ED Visit) (Signed)
   05/15/2022  Name: Monique Forbes MRN: [id number] DOB: [birth year]  Today's TOC FU Call Status: Today's TOC FU Call Status:: Unsuccessul Call (1st Attempt) Unsuccessful Call (1st Attempt) Date: 05/15/22  Attempted to reach the patient regarding the most recent Inpatient/ED visit.  Follow Up Plan: Additional outreach attempts will be made to reach the patient to complete the Transitions of Care (Post Inpatient/ED visit) call.   Mickel Fuchs, BSW, [place] Managed [employer] Team  [phone]

## 2022-05-17 ENCOUNTER — Other Ambulatory Visit: Payer: Self-pay | Admitting: Nurse Practitioner

## 2022-05-17 ENCOUNTER — Ambulatory Visit: Payer: Medicaid Other | Admitting: Nurse Practitioner

## 2022-05-17 ENCOUNTER — Encounter: Payer: Self-pay | Admitting: Nurse Practitioner

## 2022-05-17 MED ORDER — BISOPROLOL-HYDROCHLOROTHIAZIDE 5-6.25 MG PO TABS: 2.0000 | ORAL_TABLET | Freq: Every day | ORAL | 2 refills | Status: DC

## 2022-05-18 ENCOUNTER — Other Ambulatory Visit: Payer: Self-pay

## 2022-05-18 ENCOUNTER — Ambulatory Visit: Payer: Medicaid Other | Admitting: Nurse Practitioner

## 2022-05-18 DIAGNOSIS — E559 Vitamin D deficiency, unspecified: Secondary | ICD-10-CM

## 2022-05-18 DIAGNOSIS — I10 Essential (primary) hypertension: Secondary | ICD-10-CM

## 2022-05-18 DIAGNOSIS — Z6835 Body mass index (BMI) 35.0-35.9, adult: Secondary | ICD-10-CM

## 2022-05-18 DIAGNOSIS — E119 Type 2 diabetes mellitus without complications: Secondary | ICD-10-CM

## 2022-05-18 MED ORDER — TIRZEPATIDE 5 MG/0.5ML ~~LOC~~ SOAJ: 5.0000 mg | SUBCUTANEOUS | 1 refills | Status: DC

## 2022-05-18 MED ORDER — HYDROCHLOROTHIAZIDE 12.5 MG PO TABS: 12.5000 mg | ORAL_TABLET | Freq: Every day | ORAL | 0 refills | Status: DC

## 2022-05-18 MED ORDER — AMLODIPINE-OLMESARTAN 10-20 MG PO TABS: 1.0000 | ORAL_TABLET | Freq: Every day | ORAL | 2 refills | Status: DC

## 2022-05-18 MED ORDER — VITAMIN D (ERGOCALCIFEROL) 1.25 MG (50000 UNIT) PO CAPS: 50000.0000 [IU] | ORAL_CAPSULE | ORAL | 1 refills | Status: DC

## 2022-05-22 ENCOUNTER — Telehealth: Payer: Self-pay

## 2022-05-22 NOTE — Telephone Encounter (Signed)
Patient called to report BP of 104/53. Per Alyssa, this reading okay. Advised patient to call us if she begins to experience any dizziness.

## 2022-05-25 DIAGNOSIS — I10 Essential (primary) hypertension: Secondary | ICD-10-CM | POA: Diagnosis not present

## 2022-05-25 DIAGNOSIS — Z7984 Long term (current) use of oral hypoglycemic drugs: Secondary | ICD-10-CM | POA: Diagnosis not present

## 2022-05-25 DIAGNOSIS — E119 Type 2 diabetes mellitus without complications: Secondary | ICD-10-CM | POA: Diagnosis not present

## 2022-05-25 DIAGNOSIS — R079 Chest pain, unspecified: Secondary | ICD-10-CM | POA: Diagnosis not present

## 2022-05-25 DIAGNOSIS — R531 Weakness: Secondary | ICD-10-CM | POA: Diagnosis not present

## 2022-05-25 DIAGNOSIS — Z79899 Other long term (current) drug therapy: Secondary | ICD-10-CM | POA: Diagnosis not present

## 2022-05-30 ENCOUNTER — Telehealth: Payer: Self-pay | Admitting: Nurse Practitioner

## 2022-05-30 NOTE — Telephone Encounter (Signed)
59 pages of MR faxed to [facility]; [phone]

## 2022-06-05 ENCOUNTER — Telehealth: Payer: Self-pay

## 2022-06-11 ENCOUNTER — Ambulatory Visit: Payer: Self-pay | Admitting: Internal Medicine

## 2022-06-12 ENCOUNTER — Encounter: Payer: Medicaid Other | Admitting: Obstetrics & Gynecology

## 2022-06-14 ENCOUNTER — Ambulatory Visit: Payer: Self-pay | Admitting: Nurse Practitioner

## 2022-06-14 NOTE — Telephone Encounter (Signed)
Done

## 2022-06-18 ENCOUNTER — Emergency Department (HOSPITAL_COMMUNITY): Admission: EM | Admit: 2022-06-18 | Discharge: 2022-06-18 | Discharge status: Against Medical Advice | Payer: Self-pay

## 2022-06-18 NOTE — ED Notes (Signed)
Not in waiting area x 2 

## 2022-06-19 ENCOUNTER — Other Ambulatory Visit: Payer: Self-pay

## 2022-06-19 ENCOUNTER — Telehealth: Payer: Self-pay

## 2022-06-19 ENCOUNTER — Other Ambulatory Visit: Payer: Self-pay | Admitting: Nurse Practitioner

## 2022-06-19 NOTE — Telephone Encounter (Signed)
[name] to call us back 

## 2022-06-22 MED ORDER — LEVETIRACETAM 500 MG PO TABS: 500.0000 mg | ORAL_TABLET | Freq: Two times a day (BID) | ORAL | 5 refills | Status: DC

## 2022-06-24 ENCOUNTER — Other Ambulatory Visit: Payer: Self-pay | Admitting: Nurse Practitioner

## 2022-06-24 MED ORDER — TOPIRAMATE 25 MG PO TABS: 25.0000 mg | ORAL_TABLET | Freq: Two times a day (BID) | ORAL | 3 refills | Status: DC

## 2022-06-24 NOTE — Progress Notes (Signed)
Added topiramate for adjunct therapy for seizure prevention.

## 2022-06-25 NOTE — Telephone Encounter (Signed)
Spoke with pt she read [employer]

## 2022-06-30 IMAGING — DX DG CHEST 2V
2 series · 2 of 2 positions shown · non-contrast
Comparison: 02/26/2019

CLINICAL DATA: Chest pain

EXAM:
CHEST - 2 VIEW

[chest pa]
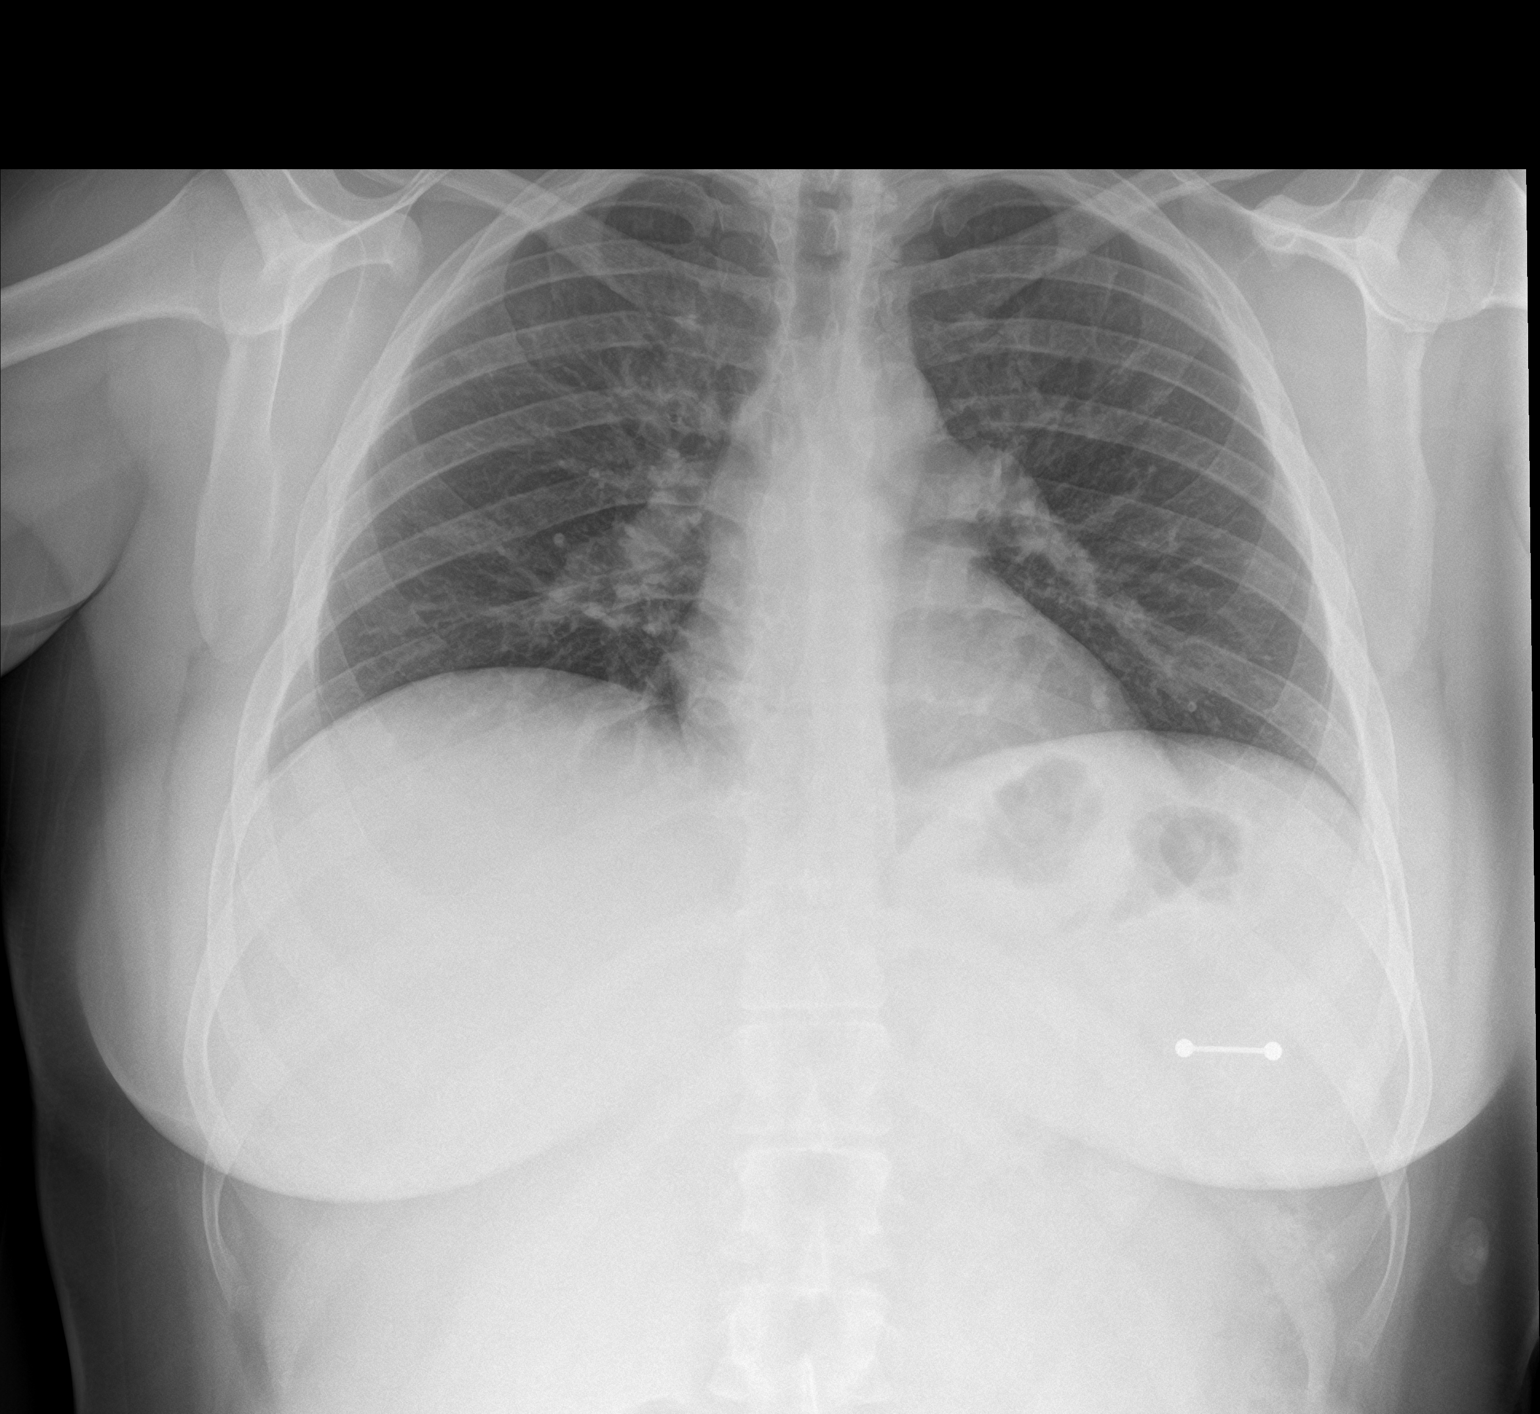

[chest lat]
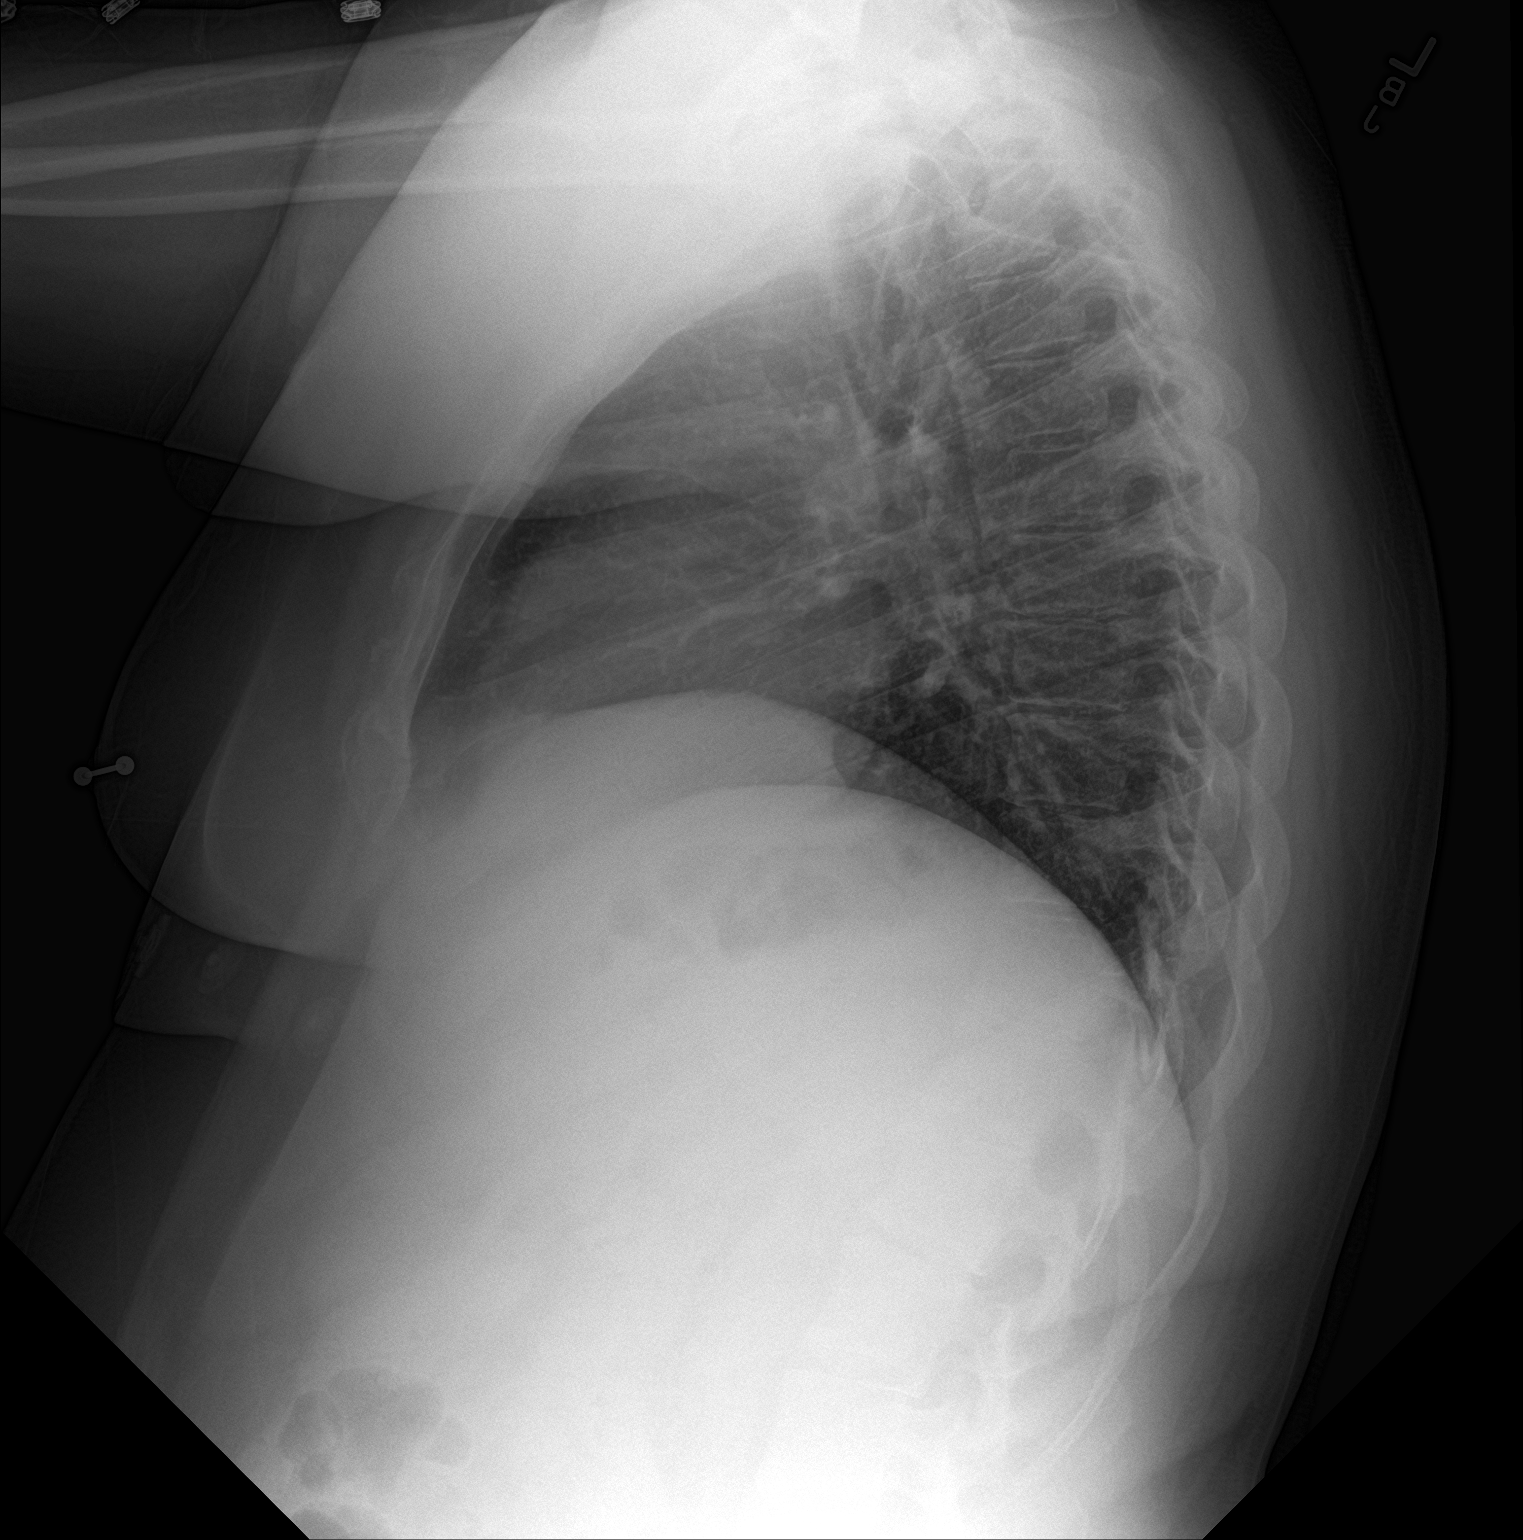

[2 of 2 positions shown; findings below may reference images not displayed]

FINDINGS: The heart size and mediastinal contours are within normal limits.
Both lungs are clear. The visualized skeletal structures are
unremarkable. Low lung volumes.
IMPRESSION: No active cardiopulmonary disease.

## 2022-07-02 ENCOUNTER — Telehealth: Payer: Self-pay | Admitting: Nurse Practitioner

## 2022-07-02 NOTE — Telephone Encounter (Signed)
Per patient's request. Last office note and hemaglobin printed. At front desk to pick up-Toni

## 2022-07-03 ENCOUNTER — Telehealth: Payer: Self-pay

## 2022-07-03 NOTE — Telephone Encounter (Signed)
Pt walk in today said no insurance and she out with insulin as per alyssa  gave her samples for Ozempic as per alyssa advised her 0.5 mg once a week

## 2022-07-05 ENCOUNTER — Encounter: Payer: Medicaid Other | Admitting: Obstetrics & Gynecology

## 2022-07-10 ENCOUNTER — Other Ambulatory Visit: Payer: Self-pay

## 2022-07-10 DIAGNOSIS — K219 Gastro-esophageal reflux disease without esophagitis: Secondary | ICD-10-CM

## 2022-07-10 DIAGNOSIS — I10 Essential (primary) hypertension: Secondary | ICD-10-CM

## 2022-07-10 MED ORDER — OMEPRAZOLE 40 MG PO CPDR: 40.0000 mg | DELAYED_RELEASE_CAPSULE | Freq: Two times a day (BID) | ORAL | 2 refills | Status: DC

## 2022-07-10 MED ORDER — HYDROCHLOROTHIAZIDE 12.5 MG PO TABS: 12.5000 mg | ORAL_TABLET | Freq: Every day | ORAL | 0 refills | Status: DC

## 2022-07-10 MED ORDER — AMLODIPINE-OLMESARTAN 10-20 MG PO TABS: 1.0000 | ORAL_TABLET | Freq: Every day | ORAL | 2 refills | Status: DC

## 2022-07-18 ENCOUNTER — Ambulatory Visit: Payer: Medicaid Other | Admitting: Nurse Practitioner

## 2022-07-26 ENCOUNTER — Ambulatory Visit (INDEPENDENT_AMBULATORY_CARE_PROVIDER_SITE_OTHER): Payer: Medicaid Other | Admitting: Nurse Practitioner

## 2022-07-26 ENCOUNTER — Encounter: Payer: Self-pay | Admitting: Nurse Practitioner

## 2022-07-26 VITALS — BP 120/76 | HR 82 | Temp 98.3°F | Resp 16 | Ht 65.0 in | Wt 206.4 lb

## 2022-07-26 DIAGNOSIS — I10 Essential (primary) hypertension: Secondary | ICD-10-CM | POA: Diagnosis not present

## 2022-07-26 DIAGNOSIS — F332 Major depressive disorder, recurrent severe without psychotic features: Secondary | ICD-10-CM | POA: Diagnosis not present

## 2022-07-26 DIAGNOSIS — F411 Generalized anxiety disorder: Secondary | ICD-10-CM

## 2022-07-26 DIAGNOSIS — E119 Type 2 diabetes mellitus without complications: Secondary | ICD-10-CM

## 2022-07-26 DIAGNOSIS — Z3200 Encounter for pregnancy test, result unknown: Secondary | ICD-10-CM | POA: Diagnosis not present

## 2022-07-26 MED ORDER — SEMAGLUTIDE (1 MG/DOSE) 4 MG/3ML ~~LOC~~ SOPN: 1.0000 mg | PEN_INJECTOR | SUBCUTANEOUS | 5 refills | Status: DC

## 2022-07-26 MED ORDER — DULOXETINE HCL 30 MG PO CPEP: 30.0000 mg | ORAL_CAPSULE | Freq: Every day | ORAL | 3 refills | Status: DC
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
## 2022-07-27 ENCOUNTER — Encounter: Payer: Self-pay | Admitting: Nurse Practitioner

## 2022-07-27 LAB — POCT CBG (FASTING - GLUCOSE)-MANUAL ENTRY: Glucose Fasting, POC: 133 mg/dL — AB (ref 70–99)

## 2022-08-02 ENCOUNTER — Telehealth: Payer: Self-pay

## 2022-08-07 NOTE — Telephone Encounter (Signed)
Send message to alyssa 

## 2022-08-19 ENCOUNTER — Other Ambulatory Visit: Payer: Self-pay | Admitting: Nurse Practitioner

## 2022-08-23 ENCOUNTER — Ambulatory Visit: Payer: Medicaid Other | Admitting: Nurse Practitioner

## 2022-08-29 ENCOUNTER — Encounter: Payer: Self-pay | Admitting: Emergency Medicine

## 2022-08-29 ENCOUNTER — Ambulatory Visit
Admission: EM | Admit: 2022-08-29 | Discharge: 2022-08-29 | Disposition: A | Discharge status: Home / Self Care | Payer: Medicaid Other | Attending: Family Medicine | Admitting: Family Medicine

## 2022-08-29 ENCOUNTER — Other Ambulatory Visit: Payer: Self-pay

## 2022-08-29 DIAGNOSIS — W57XXXA Bitten or stung by nonvenomous insect and other nonvenomous arthropods, initial encounter: Secondary | ICD-10-CM

## 2022-08-29 DIAGNOSIS — R6889 Other general symptoms and signs: Secondary | ICD-10-CM | POA: Diagnosis not present

## 2022-08-29 DIAGNOSIS — S80261A Insect bite (nonvenomous), right knee, initial encounter: Secondary | ICD-10-CM

## 2022-08-29 MED ORDER — DOXYCYCLINE HYCLATE 100 MG PO CAPS: 100.0000 mg | ORAL_CAPSULE | Freq: Two times a day (BID) | ORAL | 0 refills | Status: DC

## 2022-08-29 NOTE — ED Triage Notes (Signed)
Pt reports tick to RLE since this am. Pt reports did not feel comfortable removing tick at home. Reports general malaise for last few days.
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
## 2022-08-30 ENCOUNTER — Telehealth: Payer: Self-pay | Admitting: Internal Medicine

## 2022-08-30 DIAGNOSIS — R600 Localized edema: Secondary | ICD-10-CM

## 2022-08-30 NOTE — Telephone Encounter (Signed)
Patient states when she presses down on her leg it has an intention mark. She states she takes a diuretic and doesn't have any visible swelling at all, but she assumes it may be due to fluid retention.

## 2022-08-30 NOTE — Telephone Encounter (Signed)
Spoke with pt who c/o pitting edema for 1 week to the left leg. Denies redness and warn to the touch. She states that she has been SOB when walking for 2 weeks. Pt did start elevating legs today. Reports that she takes diuretics as prescribed. Please advise.

## 2022-08-31 ENCOUNTER — Other Ambulatory Visit (HOSPITAL_COMMUNITY)
Admission: RE | Admit: 2022-08-31 | Discharge: 2022-08-31 | Disposition: A | Discharge status: Home / Self Care | Payer: Medicaid Other | Source: Ambulatory Visit | Attending: Internal Medicine | Admitting: Internal Medicine

## 2022-08-31 DIAGNOSIS — R600 Localized edema: Secondary | ICD-10-CM | POA: Insufficient documentation

## 2022-08-31 DIAGNOSIS — R7989 Other specified abnormal findings of blood chemistry: Secondary | ICD-10-CM | POA: Insufficient documentation

## 2022-08-31 LAB — BRAIN NATRIURETIC PEPTIDE: B Natriuretic Peptide: 7 pg/mL (ref 0.0–100.0)

## 2022-08-31 NOTE — Telephone Encounter (Signed)
Patient notified and verbalized agreement to lab work for further evaluation.   Patient had no further questions or concerns at this time.

## 2022-09-02 LAB — T4: T4, Total: 8.6 ug/dL (ref 4.5–12.0)

## 2022-09-03 ENCOUNTER — Telehealth: Payer: Self-pay

## 2022-09-03 NOTE — Telephone Encounter (Signed)
Faxed  to [facility] for dexcom

## 2022-09-10 ENCOUNTER — Other Ambulatory Visit: Payer: Self-pay

## 2022-09-10 MED ORDER — ALBUTEROL SULFATE HFA 108 (90 BASE) MCG/ACT IN AERS: 2.0000 | INHALATION_SPRAY | Freq: Four times a day (QID) | RESPIRATORY_TRACT | 0 refills | Status: DC | PRN

## 2022-09-12 ENCOUNTER — Telehealth: Payer: Self-pay | Admitting: Nurse Practitioner

## 2022-09-12 NOTE — Telephone Encounter (Signed)
Received call from Chelsy with social services needing to verify is patient is pregnant. Per AA, patient is not-Monique Forbes

## 2022-09-25 ENCOUNTER — Ambulatory Visit
Admission: EM | Admit: 2022-09-25 | Discharge: 2022-09-25 | Disposition: A | Discharge status: Home / Self Care | Payer: Medicaid Other | Attending: Nurse Practitioner | Admitting: Nurse Practitioner

## 2022-09-25 DIAGNOSIS — H6992 Unspecified Eustachian tube disorder, left ear: Secondary | ICD-10-CM | POA: Diagnosis not present

## 2022-09-25 DIAGNOSIS — H6592 Unspecified nonsuppurative otitis media, left ear: Secondary | ICD-10-CM | POA: Diagnosis not present

## 2022-09-25 MED ORDER — PREDNISONE 20 MG PO TABS: 20.0000 mg | ORAL_TABLET | Freq: Every day | ORAL | 0 refills | Status: AC

## 2022-09-25 MED ORDER — FLUTICASONE PROPIONATE 50 MCG/ACT NA SUSP: 2.0000 | Freq: Every day | NASAL | 0 refills | Status: DC
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
## 2022-09-25 NOTE — ED Triage Notes (Signed)
Pt c/o left ear pain x 3 days causing dizziness.

## 2022-09-25 NOTE — Discharge Instructions (Addendum)
Take medication as prescribed. Increase fluids and allow for plenty of rest. May take over-the-counter Tylenol as needed for pain, fever, or general discomfort. Warm compresses to the left ear to help with pain or discomfort. As discussed, please double check with your obstetrician to ensure prednisone is safe for your pregnancy. If symptoms do not improve, please follow-up with your primary care physician/obstetrician for further evaluation. Follow-up as needed.

## 2022-11-02 ENCOUNTER — Encounter: Payer: Medicaid Other | Admitting: Nurse Practitioner

## 2022-11-06 ENCOUNTER — Encounter: Payer: Self-pay | Admitting: Neurology

## 2022-11-06 ENCOUNTER — Ambulatory Visit: Payer: Medicaid Other | Admitting: Neurology

## 2022-11-06 VITALS — BP 122/80 | HR 81 | Ht 65.0 in | Wt 213.5 lb

## 2022-11-06 DIAGNOSIS — R569 Unspecified convulsions: Secondary | ICD-10-CM | POA: Diagnosis not present

## 2022-11-06 NOTE — Patient Instructions (Signed)
Continue current medications  Start prenatal multivitamin  Return in 6 months, at that time, will obtain EEG and additional test if needed.  Please contact me if you do have an event concerning for seizure
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
## 2022-12-19 ENCOUNTER — Emergency Department (HOSPITAL_COMMUNITY)
Admission: EM | Admit: 2022-12-19 | Discharge: 2022-12-19 | Disposition: A | Discharge status: Home / Self Care | Payer: Medicaid Other | Attending: Emergency Medicine | Admitting: Emergency Medicine

## 2022-12-19 ENCOUNTER — Emergency Department (HOSPITAL_COMMUNITY): Payer: Medicaid Other

## 2022-12-19 ENCOUNTER — Other Ambulatory Visit: Payer: Self-pay

## 2022-12-19 DIAGNOSIS — E119 Type 2 diabetes mellitus without complications: Secondary | ICD-10-CM | POA: Diagnosis not present

## 2022-12-19 DIAGNOSIS — R5381 Other malaise: Secondary | ICD-10-CM | POA: Diagnosis present

## 2022-12-19 DIAGNOSIS — R000 Tachycardia, unspecified: Secondary | ICD-10-CM | POA: Diagnosis not present

## 2022-12-19 DIAGNOSIS — I10 Essential (primary) hypertension: Secondary | ICD-10-CM | POA: Diagnosis not present

## 2022-12-19 DIAGNOSIS — J45909 Unspecified asthma, uncomplicated: Secondary | ICD-10-CM | POA: Diagnosis not present

## 2022-12-19 DIAGNOSIS — R0789 Other chest pain: Secondary | ICD-10-CM | POA: Diagnosis not present

## 2022-12-19 LAB — CBC
HCT: 38.9 % (ref 36.0–46.0)
Hemoglobin: 13.1 g/dL (ref 12.0–15.0)
MCH: 27.7 pg (ref 26.0–34.0)
MCHC: 33.7 g/dL (ref 30.0–36.0)
MCV: 82.2 fL (ref 80.0–100.0)
Platelets: 406 10*3/uL — ABNORMAL HIGH (ref 150–400)
RBC: 4.73 MIL/uL (ref 3.87–5.11)
RDW: 13.1 % (ref 11.5–15.5)
WBC: 11.3 10*3/uL — ABNORMAL HIGH (ref 4.0–10.5)
nRBC: 0 % (ref 0.0–0.2)

## 2022-12-19 LAB — URINALYSIS, ROUTINE W REFLEX MICROSCOPIC
Bilirubin Urine: NEGATIVE
Glucose, UA: NEGATIVE mg/dL
Hgb urine dipstick: NEGATIVE
Ketones, ur: NEGATIVE mg/dL
Leukocytes,Ua: NEGATIVE
Nitrite: NEGATIVE
Protein, ur: 30 mg/dL — AB
Specific Gravity, Urine: 1.011 (ref 1.005–1.030)
pH: 7 (ref 5.0–8.0)

## 2022-12-19 LAB — BASIC METABOLIC PANEL
Anion gap: 13 (ref 5–15)
BUN: 11 mg/dL (ref 6–20)
CO2: 22 mmol/L (ref 22–32)
Calcium: 9.2 mg/dL (ref 8.9–10.3)
Chloride: 104 mmol/L (ref 98–111)
Creatinine, Ser: 0.72 mg/dL (ref 0.44–1.00)
GFR, Estimated: >60 mL/min (ref >60)
Glucose, Bld: 144 mg/dL — ABNORMAL HIGH (ref 70–99)
Potassium: 3.7 mmol/L (ref 3.5–5.1)
Sodium: 139 mmol/L (ref 135–145)

## 2022-12-19 LAB — POC URINE PREG, ED: Preg Test, Ur: NEGATIVE

## 2022-12-19 LAB — PROTIME-INR
INR: 0.9 (ref 0.8–1.2)
Prothrombin Time: 12.6 s (ref 11.4–15.2)

## 2022-12-19 LAB — TROPONIN I (HIGH SENSITIVITY)
Troponin I (High Sensitivity): 4 ng/L (ref <18)
Troponin I (High Sensitivity): 4 ng/L (ref <18)

## 2022-12-19 MED ORDER — ONDANSETRON HCL 4 MG/2ML IJ SOLN
4.0000 mg | Freq: Once | INTRAMUSCULAR | Status: DC
  Filled 2022-12-19: qty 2
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
## 2022-12-19 NOTE — Discharge Instructions (Signed)
You were evaluated in the Emergency Department and after careful evaluation, we did not find any emergent condition requiring admission or further testing in the hospital.  Your exam/testing today is overall reassuring.  Symptoms may be due to an early viral illness.  Recommend plenty of fluids and rest and follow-up with your primary care doctor.  Please return to the Emergency Department if you experience any worsening of your condition.   Thank you for allowing [place] to be a part of your care.

## 2022-12-19 NOTE — Progress Notes (Signed)
Provided cup of water to patient and adv we need a urine sample. Pt verbalized understanding. Pt refused zofran.

## 2022-12-19 NOTE — ED Triage Notes (Signed)
Pt c/o chest pain that comes and goes, but worse with exertion. Reports her BP has been elevated today, has been compliant with medication. Denies SOB. also c/o frequentant urination

## 2023-02-20 ENCOUNTER — Ambulatory Visit (HOSPITAL_COMMUNITY)
Admission: EM | Admit: 2023-02-20 | Discharge: 2023-02-20 | Disposition: A | Discharge status: Home / Self Care | Payer: Medicaid Other | Attending: Nurse Practitioner | Admitting: Nurse Practitioner

## 2023-02-20 DIAGNOSIS — F331 Major depressive disorder, recurrent, moderate: Secondary | ICD-10-CM | POA: Insufficient documentation

## 2023-02-20 DIAGNOSIS — F411 Generalized anxiety disorder: Secondary | ICD-10-CM | POA: Insufficient documentation

## 2023-02-20 DIAGNOSIS — Z560 Unemployment, unspecified: Secondary | ICD-10-CM | POA: Insufficient documentation

## 2023-02-20 DIAGNOSIS — Z62810 Personal history of physical and sexual abuse in childhood: Secondary | ICD-10-CM | POA: Insufficient documentation

## 2023-02-20 DIAGNOSIS — F4310 Post-traumatic stress disorder, unspecified: Secondary | ICD-10-CM

## 2023-02-20 DIAGNOSIS — Z9151 Personal history of suicidal behavior: Secondary | ICD-10-CM | POA: Insufficient documentation
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
## 2023-02-20 NOTE — ED Notes (Addendum)
Pt discharged by provider with AVS. AVS reviewed prior to discharge. Pt alert, oriented, and ambulatory. Safety maintained.

## 2023-02-20 NOTE — Discharge Instructions (Signed)
  Discharge recommendations:  Patient is to take medications as prescribed. Please see information for follow-up appointment with psychiatry and therapy. Please follow up with your primary care provider for all medical related needs.  Please follow up at [facility] outpatient clinic during walk in hours starting at 7:15 am to establish medication management and outpatient therapy services.   Therapy: We recommend that patient participate in individual therapy to address mental health concerns.  Medications: The patient or guardian is to contact a medical professional and/or outpatient provider to address any new side effects that develop. The patient or guardian should update outpatient providers of any new medications and/or medication changes.   Atypical antipsychotics: If you are prescribed an atypical antipsychotic, it is recommended that your height, weight, BMI, blood pressure, fasting lipid panel, and fasting blood sugar be monitored by your outpatient providers.  Safety:  The patient should abstain from use of illicit substances/drugs and abuse of any medications. If symptoms worsen or do not continue to improve or if the patient becomes actively suicidal or homicidal then it is recommended that the patient return to the closest hospital emergency department, the [facility], or call 911 for further evaluation and treatment. National Suicide Prevention Lifeline 1-800-SUICIDE or 951-489-7433.  About 988 988 offers 24/7 access to trained crisis counselors who can help people experiencing mental health-related distress. People can call or text 988 or chat 988lifeline.org for themselves or if they are worried about a loved one who may need crisis support.  Crisis Mobile: [facility] [facility]:                     857 308 9948 (for crisis response 24 hours a day) [facility] Hotline:                                            (224)235-1610
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
## 2023-03-16 ENCOUNTER — Other Ambulatory Visit: Payer: Self-pay

## 2023-03-16 ENCOUNTER — Emergency Department (HOSPITAL_COMMUNITY)
Admission: EM | Admit: 2023-03-16 | Discharge: 2023-03-16 | Disposition: A | Discharge status: Home / Self Care | Payer: Medicaid Other | Attending: Emergency Medicine | Admitting: Emergency Medicine

## 2023-03-16 ENCOUNTER — Encounter (HOSPITAL_COMMUNITY): Payer: Self-pay

## 2023-03-16 DIAGNOSIS — T363X5A Adverse effect of macrolides, initial encounter: Secondary | ICD-10-CM | POA: Diagnosis not present

## 2023-03-16 DIAGNOSIS — T50901A Poisoning by unspecified drugs, medicaments and biological substances, accidental (unintentional), initial encounter: Secondary | ICD-10-CM | POA: Diagnosis present

## 2023-03-16 NOTE — ED Notes (Signed)
 Ambulatory to great distance throughout ED hallways. Denies dizziness. Patient appears stable and displays no signs of distress.

## 2023-03-16 NOTE — ED Triage Notes (Signed)
 Pov from home. Cc of accidentally taking an extra dose of her bp meds. Took it at 10 and 11.  Azor 10-20mg 

## 2023-03-16 NOTE — ED Notes (Signed)
 Discharge instructions reviewed with patient and support person.   Medications techniques for administration reviewed.   Pt encouraged to always call poison control like today for accidental ingestions.   Alert, oriented and ambulatory. Displays no signs of distress.   Opportunity for questions and concerns provided.

## 2023-03-16 NOTE — ED Provider Notes (Signed)
  [place] EMERGENCY DEPARTMENT AT [facility] Provider Note   CSN: [id number] Arrival date & time: 03/16/23  0116     History  No chief complaint on file.   Monique Forbes is a 33 y.o. female.  Patient accidentally took two doses of azor  tonight. Called poison control. Told her to come here for evaluation. Takes azor  10-20. Had an episode of light headedness and nausea earlier. Took a dose at 2200 and second at 2300. No syncope. No fatigue. Husband with her. Both deny any self harm intent or behavior recently. Asymptomatic at this time wanting to go home.         Home Medications Prior to Admission medications   Medication Sig Start Date End Date Taking? Authorizing Provider  amlodipine -olmesartan  (AZOR ) 10-20 MG tablet Take 1 tablet by mouth daily. 07/10/22   Liana Fish, NP  metFORMIN  (GLUCOPHAGE -XR) 750 MG 24 hr tablet Take 750 mg by mouth daily with breakfast. 10/30/22 10/30/23  [provider]  omeprazole  (PRILOSEC) 40 MG capsule Take 1 capsule (40 mg total) by mouth 2 (two) times daily. 07/10/22   Liana Fish, NP      Allergies    Bee venom, Cashew nut oil, Flavoring agent, and Cashew nut (anacardium occidentale) skin test    Review of Systems   Review of Systems  Physical Exam Updated Vital Signs BP 121/82   Pulse 86   Temp 98.3 F (36.8 C)   Resp 18   Ht 5' 5 (1.651 m)   Wt 93 kg   LMP 02/18/2023 (Approximate)   SpO2 95%   Breastfeeding Unknown   BMI 34.12 kg/m  Physical Exam Vitals and nursing note reviewed.  Constitutional:      Appearance: She is well-developed.  HENT:     Head: Normocephalic and atraumatic.  Cardiovascular:     Rate and Rhythm: Normal rate and regular rhythm.     Pulses: Normal pulses.  Pulmonary:     Effort: No respiratory distress.     Breath sounds: No stridor.  Abdominal:     General: There is no distension.  Musculoskeletal:        General: No swelling. Normal range of motion.     Cervical  back: Normal range of motion.  Neurological:     Mental Status: She is alert.     ED Results / Procedures / Treatments   Labs (all labs ordered are listed, but only abnormal results are displayed) Labs Reviewed - No data to display  EKG None  Radiology No results found.  Procedures Procedures    Medications Ordered in ED Medications - No data to display  ED Course/ Medical Decision Making/ A&P                                 Medical Decision Making Amount and/or Complexity of Data Reviewed ECG/medicine tests: ordered.   Overall patient appears well. Peak plasma time of amlodipine  around 6 hours so was observed until 6 hours after last dose with no symptoms and normal vitals. Suspect she will be fine. Will take her medications as prescribed Saturday evening, no need to hold them.    Final Clinical Impression(s) / ED Diagnoses Final diagnoses:  Accidental medication error, initial encounter    Rx / DC Orders ED Discharge Orders     None         Redell Nazir, Selinda, MD 03/16/23 2300

## 2023-04-03 ENCOUNTER — Emergency Department (HOSPITAL_COMMUNITY)
Admission: EM | Admit: 2023-04-03 | Discharge: 2023-04-03 | Disposition: A | Discharge status: Home / Self Care | Payer: Medicaid Other | Attending: Emergency Medicine | Admitting: Emergency Medicine

## 2023-04-03 ENCOUNTER — Encounter (HOSPITAL_COMMUNITY): Payer: Self-pay

## 2023-04-03 ENCOUNTER — Emergency Department (HOSPITAL_COMMUNITY): Payer: Medicaid Other

## 2023-04-03 ENCOUNTER — Other Ambulatory Visit: Payer: Self-pay

## 2023-04-03 DIAGNOSIS — J45909 Unspecified asthma, uncomplicated: Secondary | ICD-10-CM | POA: Diagnosis not present

## 2023-04-03 DIAGNOSIS — J121 Respiratory syncytial virus pneumonia: Secondary | ICD-10-CM | POA: Insufficient documentation

## 2023-04-03 DIAGNOSIS — Z7984 Long term (current) use of oral hypoglycemic drugs: Secondary | ICD-10-CM | POA: Insufficient documentation

## 2023-04-03 DIAGNOSIS — I10 Essential (primary) hypertension: Secondary | ICD-10-CM | POA: Insufficient documentation

## 2023-04-03 DIAGNOSIS — Z79899 Other long term (current) drug therapy: Secondary | ICD-10-CM | POA: Diagnosis not present

## 2023-04-03 DIAGNOSIS — R059 Cough, unspecified: Secondary | ICD-10-CM | POA: Diagnosis present

## 2023-04-03 DIAGNOSIS — E119 Type 2 diabetes mellitus without complications: Secondary | ICD-10-CM | POA: Insufficient documentation

## 2023-04-03 DIAGNOSIS — B338 Other specified viral diseases: Secondary | ICD-10-CM

## 2023-04-03 DIAGNOSIS — Z20822 Contact with and (suspected) exposure to covid-19: Secondary | ICD-10-CM | POA: Insufficient documentation

## 2023-04-03 DIAGNOSIS — Z7951 Long term (current) use of inhaled steroids: Secondary | ICD-10-CM | POA: Insufficient documentation

## 2023-04-03 LAB — GROUP A STREP BY PCR: Group A Strep by PCR: NOT DETECTED

## 2023-04-03 LAB — RESP PANEL BY RT-PCR (RSV, FLU A&B, COVID)  RVPGX2
Influenza A by PCR: NEGATIVE
Influenza B by PCR: NEGATIVE
Resp Syncytial Virus by PCR: POSITIVE — AB
SARS Coronavirus 2 by RT PCR: NEGATIVE

## 2023-04-03 MED ORDER — BENZONATATE 100 MG PO CAPS: 100.0000 mg | ORAL_CAPSULE | Freq: Three times a day (TID) | ORAL | 0 refills | Status: DC

## 2023-04-03 MED ORDER — ALBUTEROL SULFATE HFA 108 (90 BASE) MCG/ACT IN AERS: 1.0000 | INHALATION_SPRAY | Freq: Four times a day (QID) | RESPIRATORY_TRACT | 0 refills | Status: AC | PRN

## 2023-04-03 NOTE — ED Triage Notes (Signed)
Pt arrived via POV c/o body aches, sore throat, congestion X 3 days.

## 2023-04-03 NOTE — ED Provider Notes (Signed)
[place] EMERGENCY DEPARTMENT AT [facility] Provider Note   CSN: [id number] Arrival date & time: 04/03/23  1318     History  Chief Complaint  Patient presents with   Cough    Chairty Forbes is a 33 y.o. female.  She presents for cough, congestion, sore throat x 3 days.  She has history of PCOS, type 2 diabetes, hypothyroidism. Her daughter is sick with the same type of symptoms.  She been taking Tylenol with some relief but states she feels like she is congested in her chest and is worried she may have pneumonia.  She does vape but has not vape for the the past several days since she has been sick.   Cough      Home Medications Prior to Admission medications   Medication Sig Start Date End Date Taking? Authorizing Provider  albuterol (VENTOLIN HFA) 108 (90 Base) MCG/ACT inhaler Inhale 1-2 puffs into the lungs every 6 (six) hours as needed for wheezing or shortness of breath. 04/03/23  Yes Wynee Matarazzo A, PA-C  benzonatate (TESSALON) 100 MG capsule Take 1 capsule (100 mg total) by mouth every 8 (eight) hours. 04/03/23  Yes Avneet Ashmore A, PA-C  amlodipine-olmesartan (AZOR) 10-20 MG tablet Take 1 tablet by mouth daily. 07/10/22   Sallyanne Kuster, NP  metFORMIN (GLUCOPHAGE-XR) 750 MG 24 hr tablet Take 750 mg by mouth daily with breakfast. 10/30/22 10/30/23  [provider]  omeprazole (PRILOSEC) 40 MG capsule Take 1 capsule (40 mg total) by mouth 2 (two) times daily. 07/10/22   Sallyanne Kuster, NP      Allergies    Bee venom, Cashew nut oil, Flavoring agent (non-screening), and Cashew nut (anacardium occidentale) skin test    Review of Systems   Review of Systems  Respiratory:  Positive for cough.     Physical Exam Updated Vital Signs BP 134/76 (BP Location: Right Arm)   Pulse (!) 103   Temp 98.3 F (36.8 C) (Oral)   Resp 18   Ht 5\' 5"  (1.651 m)   Wt 93 kg   LMP 02/18/2023 (Approximate)   SpO2 100%   BMI 34.12 kg/m  Physical Exam Vitals  and nursing note reviewed.  Constitutional:      General: She is not in acute distress.    Appearance: She is well-developed.  HENT:     Head: Normocephalic and atraumatic.     Right Ear: Tympanic membrane and ear canal normal.     Left Ear: Tympanic membrane and ear canal normal.     Mouth/Throat:     Mouth: Mucous membranes are moist.     Pharynx: No oropharyngeal exudate or posterior oropharyngeal erythema.  Eyes:     Extraocular Movements: Extraocular movements intact.     Conjunctiva/sclera: Conjunctivae normal.     Pupils: Pupils are equal, round, and reactive to light.  Cardiovascular:     Rate and Rhythm: Normal rate and regular rhythm.     Heart sounds: No murmur heard. Pulmonary:     Effort: Pulmonary effort is normal. No respiratory distress.     Breath sounds: Normal breath sounds.  Abdominal:     Palpations: Abdomen is soft.     Tenderness: There is no abdominal tenderness.  Musculoskeletal:        General: No swelling.     Cervical back: Neck supple.     Right lower leg: No edema.     Left lower leg: No edema.  Skin:    General: Skin  is warm and dry.     Capillary Refill: Capillary refill takes less than 2 seconds.  Neurological:     General: No focal deficit present.     Mental Status: She is alert and oriented to person, place, and time.  Psychiatric:        Mood and Affect: Mood normal.     ED Results / Procedures / Treatments   Labs (all labs ordered are listed, but only abnormal results are displayed) Labs Reviewed  RESP PANEL BY RT-PCR (RSV, FLU A&B, COVID)  RVPGX2 - Abnormal; Notable for the following components:      Result Value   Resp Syncytial Virus by PCR POSITIVE (*)    All other components within normal limits  GROUP A STREP BY PCR    EKG None  Radiology DG Chest 2 View Result Date: 04/03/2023 CLINICAL DATA:  Cough, body aches, congestion, and sore throat. EXAM: CHEST - 2 VIEW COMPARISON:  Chest x-ray dated December 19, 2022. FINDINGS:  The heart size and mediastinal contours are within normal limits. Both lungs are clear. The visualized skeletal structures are unremarkable. IMPRESSION: No active cardiopulmonary disease. Electronically Signed   By: Obie Dredge M.D.   On: 04/03/2023 14:59    Procedures Procedures    Medications Ordered in ED Medications - No data to display  ED Course/ Medical Decision Making/ A&P                                 Medical Decision Making This patient presents to the ED for concern of cough and sore throat, this involves an extensive number of treatment options, and is a complaint that carries with it a high risk of complications and morbidity.  The differential diagnosis includes viral illness, pneumonia, postnasal drip, GERD, allergic rhinitis, other   Co morbidities that complicate the patient evaluation  Diabetes, asthma, hypertension   Additional history obtained:  Additional history obtained from EMR External records from outside source obtained and reviewed including prior notes   Lab Tests:  I Ordered, and personally interpreted labs.  The pertinent results include: Negative for strep, RSV positive   Imaging Studies ordered:  I ordered imaging studies including chest x-ray I independently visualized and interpreted imaging which showed no pulmonary edema or infiltrate I agree with the radiologist interpretation     Problem List / ED Course / Critical interventions / Medication management  Cough and sore throat for 3 days, on exam patient's saturations are 97% on room air, heart rate 91 bpm.  Exam is overall reassuring.  Patient was concerned about possible pneumonia so chest x-ray was ordered and shows no acute findings.  Advised that she has RSV, she requested albuterol inhaler as she does not have 1 at home and does have history of asthma.  Advised on avoidance of decongestants due to her history of hypertension but she can take over-the-counter medication as  needed, advised to drink plenty of fluids, follow-up and return precautions given.  I have reviewed the patients home medicines and have made adjustments as needed     Amount and/or Complexity of Data Reviewed Labs: ordered. Decision-making details documented in ED Course. Radiology: ordered.  Risk Prescription drug management.           Final Clinical Impression(s) / ED Diagnoses Final diagnoses:  RSV (respiratory syncytial virus infection)    Rx / DC Orders ED Discharge Orders  Ordered    albuterol (VENTOLIN HFA) 108 (90 Base) MCG/ACT inhaler  Every 6 hours PRN        04/03/23 1507    benzonatate (TESSALON) 100 MG capsule  Every 8 hours        04/03/23 1509              Ma Rings, PA-C 04/03/23 1516    Gloris Manchester, MD 04/03/23 1708

## 2023-04-03 NOTE — Discharge Instructions (Signed)
It was a pleasure taking care of you today.  You were evaluated in the ER for cough and congestion.  Your results showed you are positive for respiratory syncytial virus.  Your strep test was negative, your chest x-ray did not show pneumonia.  You were given a prescription for Tessalon Perles for cough and an albuterol inhaler to use as needed.  Drink plenty of fluids and rest.  It is important to follow-up closely with your primary care doctor and/or any specialist as discussed.  Come back to the ER if you have any new or worsening symptoms.

## 2023-04-03 NOTE — ED Triage Notes (Signed)
Pt arrived with possible influenze. Reports body aches chill and congestion with sore throat.

## 2023-04-22 ENCOUNTER — Other Ambulatory Visit: Payer: Self-pay

## 2023-04-22 ENCOUNTER — Encounter (HOSPITAL_COMMUNITY): Payer: Self-pay | Admitting: Emergency Medicine

## 2023-04-22 ENCOUNTER — Emergency Department (HOSPITAL_COMMUNITY)
Admission: EM | Admit: 2023-04-22 | Discharge: 2023-04-23 | Discharge status: Against Medical Advice | Payer: Medicaid Other | Attending: Emergency Medicine | Admitting: Emergency Medicine

## 2023-04-22 DIAGNOSIS — Z5321 Procedure and treatment not carried out due to patient leaving prior to being seen by health care provider: Secondary | ICD-10-CM | POA: Diagnosis not present

## 2023-04-22 DIAGNOSIS — R5383 Other fatigue: Secondary | ICD-10-CM | POA: Diagnosis present

## 2023-04-22 DIAGNOSIS — R1110 Vomiting, unspecified: Secondary | ICD-10-CM | POA: Diagnosis not present

## 2023-04-22 LAB — CBC
HCT: 40.1 % (ref 36.0–46.0)
Hemoglobin: 13.1 g/dL (ref 12.0–15.0)
MCH: 27.5 pg (ref 26.0–34.0)
MCHC: 32.7 g/dL (ref 30.0–36.0)
MCV: 84.1 fL (ref 80.0–100.0)
Platelets: 444 10*3/uL — ABNORMAL HIGH (ref 150–400)
RBC: 4.77 MIL/uL (ref 3.87–5.11)
RDW: 12.6 % (ref 11.5–15.5)
WBC: 12.5 10*3/uL — ABNORMAL HIGH (ref 4.0–10.5)
nRBC: 0 % (ref 0.0–0.2)

## 2023-04-22 LAB — BASIC METABOLIC PANEL
Anion gap: 14 (ref 5–15)
BUN: 13 mg/dL (ref 6–20)
CO2: 23 mmol/L (ref 22–32)
Calcium: 9.1 mg/dL (ref 8.9–10.3)
Chloride: 101 mmol/L (ref 98–111)
Creatinine, Ser: 0.75 mg/dL (ref 0.44–1.00)
GFR, Estimated: >60 mL/min (ref >60)
Glucose, Bld: 143 mg/dL — ABNORMAL HIGH (ref 70–99)
Potassium: 3.6 mmol/L (ref 3.5–5.1)
Sodium: 138 mmol/L (ref 135–145)

## 2023-04-22 LAB — CBG MONITORING, ED: Glucose-Capillary: 133 mg/dL — ABNORMAL HIGH (ref 70–99)

## 2023-04-22 NOTE — ED Triage Notes (Signed)
 Pt c/o fatigue and states "My body just feels off". Pt states she had 1 episode of vomiting earlier today.

## 2023-04-23 LAB — URINALYSIS, ROUTINE W REFLEX MICROSCOPIC
Bacteria, UA: NONE SEEN
Bilirubin Urine: NEGATIVE
Glucose, UA: NEGATIVE mg/dL
Hgb urine dipstick: NEGATIVE
Ketones, ur: NEGATIVE mg/dL
Leukocytes,Ua: NEGATIVE
Nitrite: NEGATIVE
Protein, ur: 100 mg/dL — AB
Specific Gravity, Urine: 1.02 (ref 1.005–1.030)
pH: 6 (ref 5.0–8.0)

## 2023-04-23 LAB — PREGNANCY, URINE: Preg Test, Ur: NEGATIVE

## 2023-04-23 NOTE — ED Notes (Signed)
Called, no answer.

## 2023-05-11 IMAGING — DX DG ANKLE COMPLETE 3+V*R*
3 series · 3 of 3 positions shown · non-contrast
Comparison: None.

CLINICAL DATA: Right ankle pain and swelling after trip last night

EXAM:
RIGHT ANKLE - COMPLETE 3+ VIEW

[ankle ap]
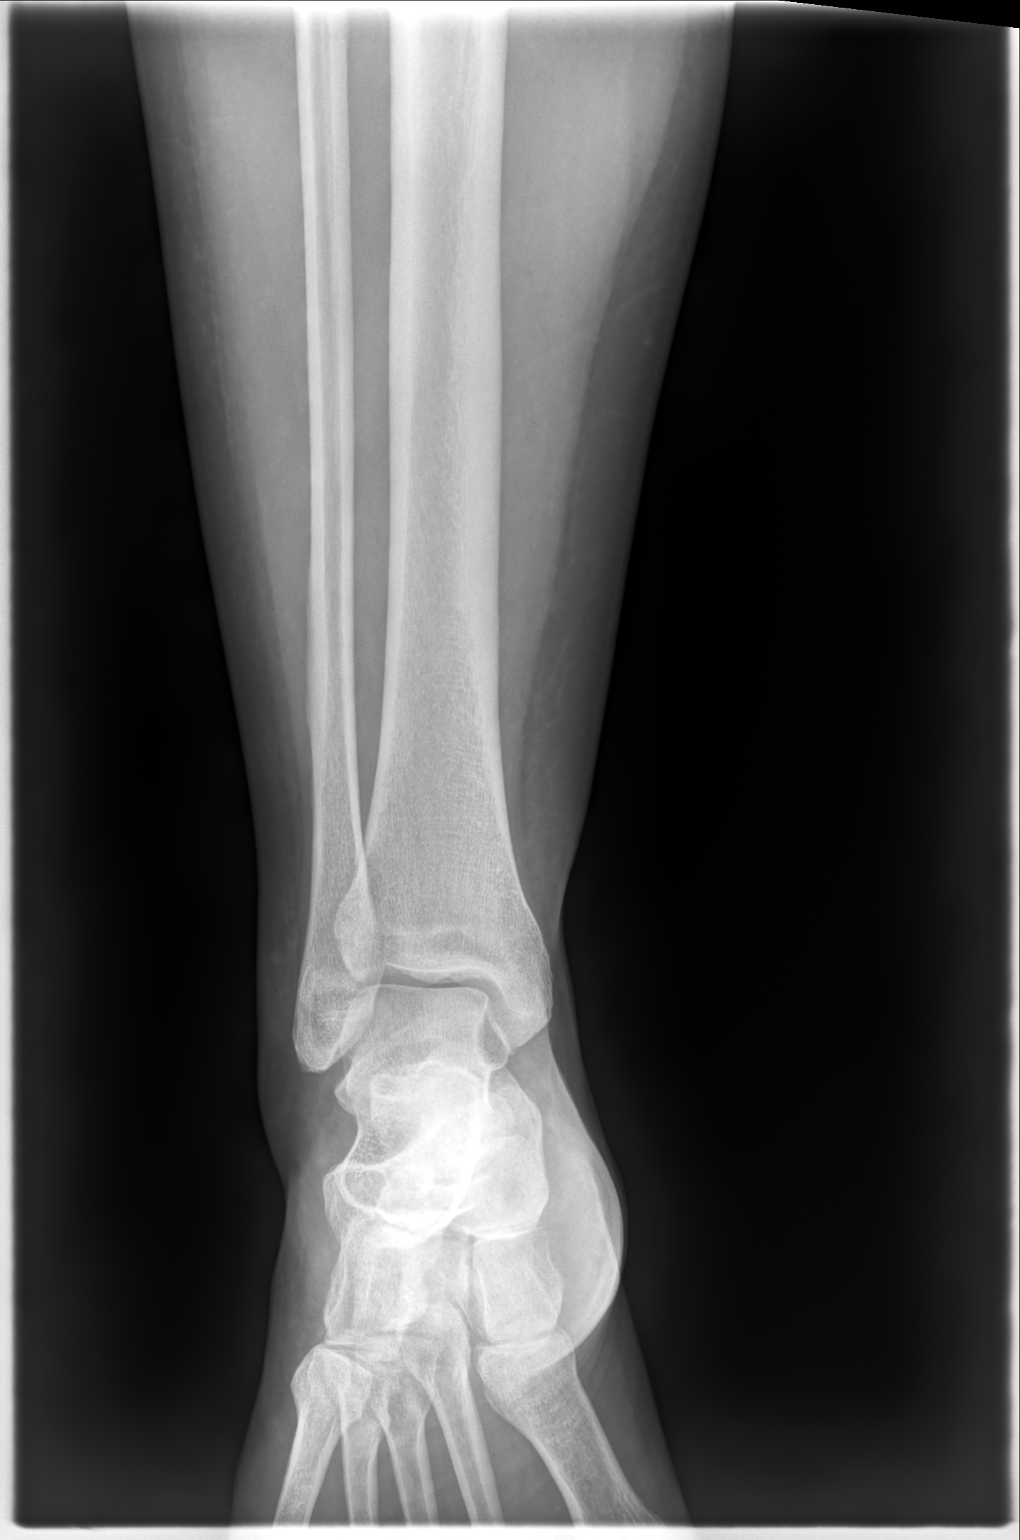

[ankle mlo]
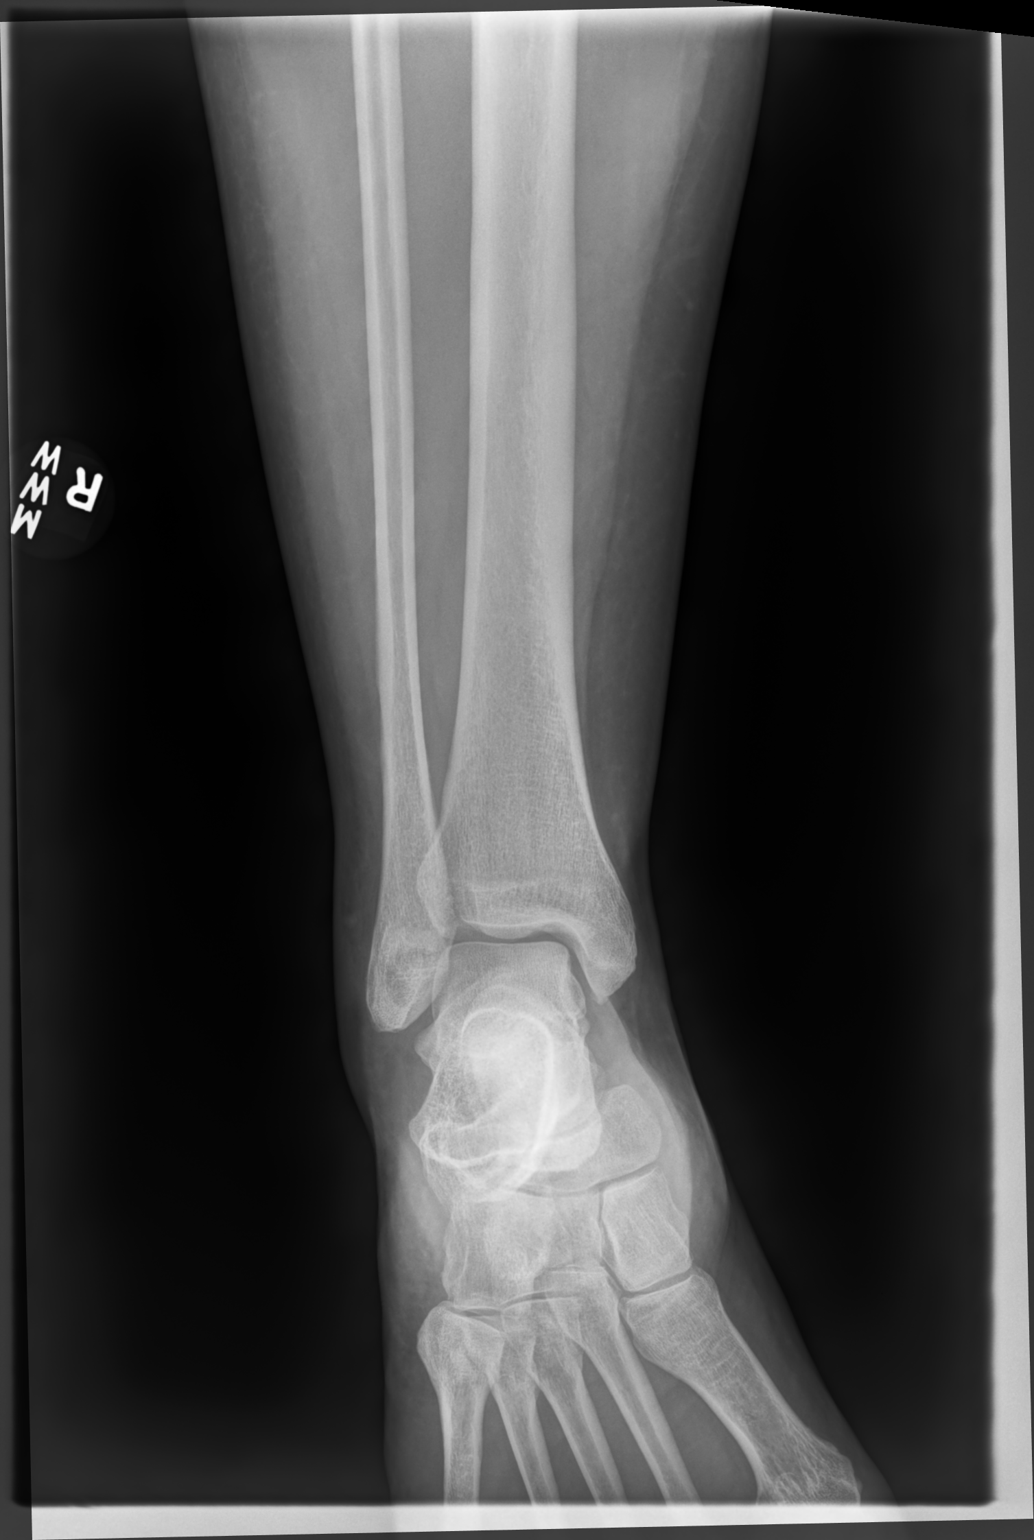

[ankle lat]
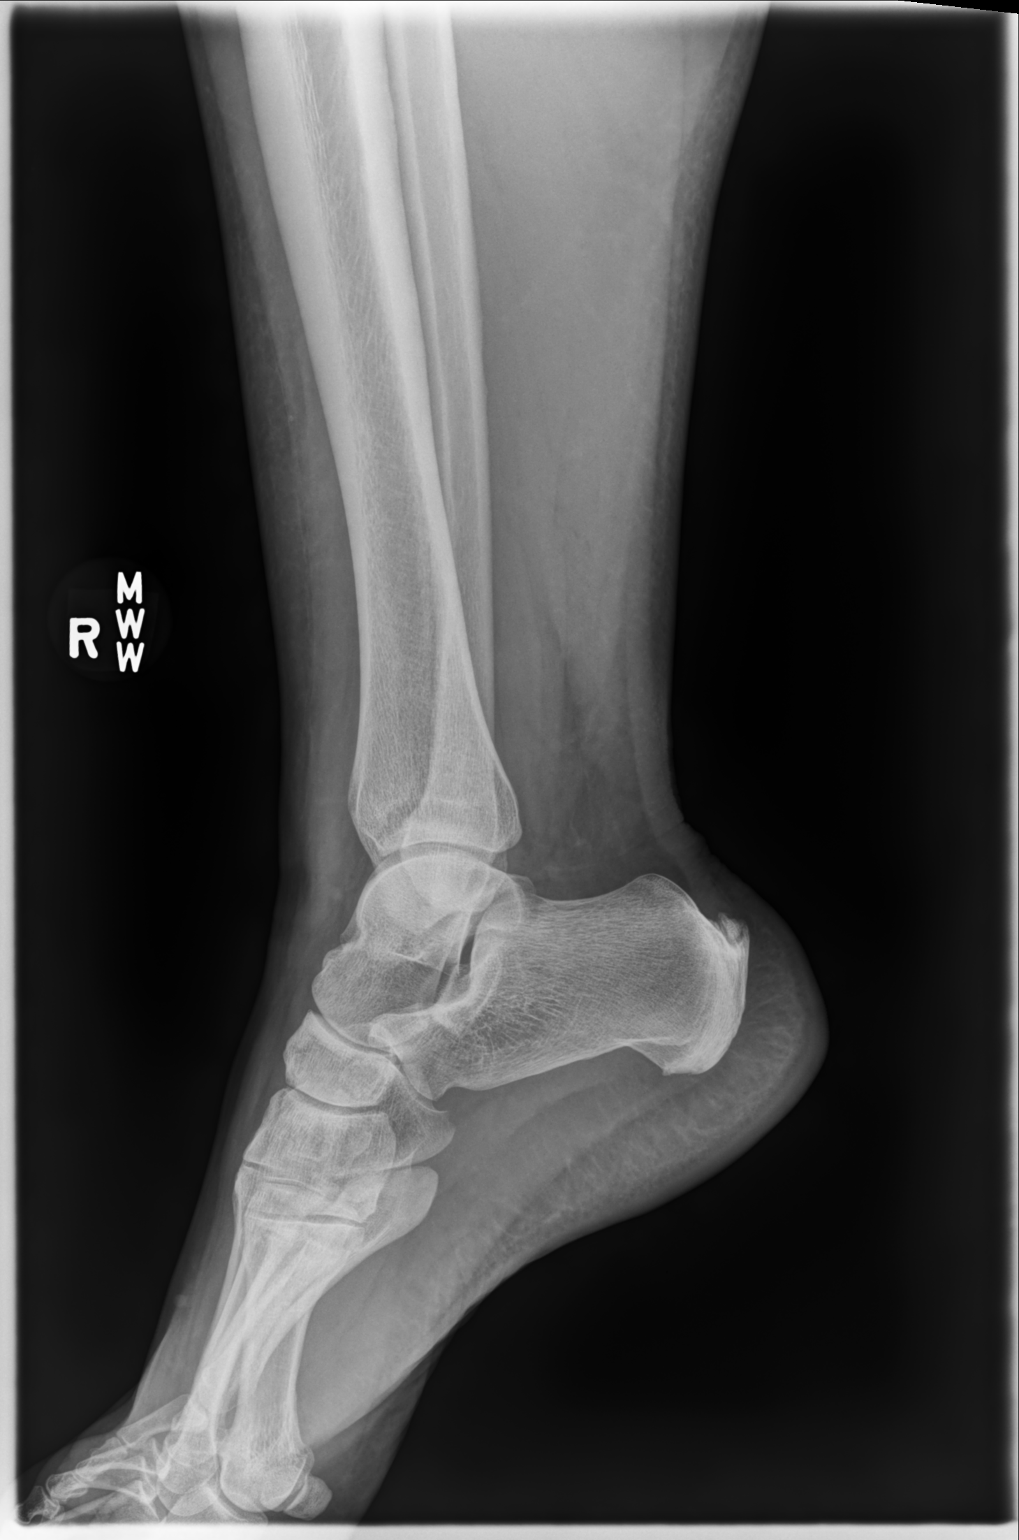

[3 of 3 positions shown; findings below may reference images not displayed]

FINDINGS: No fracture or subluxation. Small Achilles right calcaneal spur. No
suspicious focal osseous lesions. No radiopaque foreign bodies.
IMPRESSION: No right ankle fracture or subluxation. Small Achilles right
calcaneal spur.

## 2023-05-16 ENCOUNTER — Ambulatory Visit: Payer: Medicaid Other | Admitting: Neurology

## 2023-06-18 ENCOUNTER — Ambulatory Visit: Admitting: Obstetrics & Gynecology

## 2023-06-22 ENCOUNTER — Emergency Department (HOSPITAL_COMMUNITY)

## 2023-06-22 ENCOUNTER — Encounter (HOSPITAL_COMMUNITY): Payer: Self-pay

## 2023-06-22 ENCOUNTER — Emergency Department (HOSPITAL_COMMUNITY)
Admission: EM | Admit: 2023-06-22 | Discharge: 2023-06-22 | Disposition: A | Discharge status: Home / Self Care | Attending: Emergency Medicine | Admitting: Emergency Medicine

## 2023-06-22 ENCOUNTER — Other Ambulatory Visit: Payer: Self-pay

## 2023-06-22 DIAGNOSIS — R059 Cough, unspecified: Secondary | ICD-10-CM | POA: Diagnosis present

## 2023-06-22 DIAGNOSIS — J189 Pneumonia, unspecified organism: Secondary | ICD-10-CM

## 2023-06-22 DIAGNOSIS — J181 Lobar pneumonia, unspecified organism: Secondary | ICD-10-CM | POA: Insufficient documentation

## 2023-06-22 LAB — RESP PANEL BY RT-PCR (RSV, FLU A&B, COVID)  RVPGX2
Influenza A by PCR: NEGATIVE
Influenza B by PCR: NEGATIVE
Resp Syncytial Virus by PCR: NEGATIVE
SARS Coronavirus 2 by RT PCR: NEGATIVE

## 2023-06-22 MED ORDER — ALBUTEROL SULFATE HFA 108 (90 BASE) MCG/ACT IN AERS
2.0000 | INHALATION_SPRAY | Freq: Once | RESPIRATORY_TRACT | Status: AC
  Administered 2023-06-22: 2 via RESPIRATORY_TRACT

## 2023-06-22 MED ORDER — AMOXICILLIN 500 MG PO CAPS: 1000.0000 mg | ORAL_CAPSULE | Freq: Three times a day (TID) | ORAL | 0 refills | Status: DC

## 2023-06-22 MED ORDER — ALBUTEROL SULFATE HFA 108 (90 BASE) MCG/ACT IN AERS
INHALATION_SPRAY | RESPIRATORY_TRACT | Status: DC
Start: 2023-06-22 — End: 2023-06-22
  Filled 2023-06-22: qty 6.7

## 2023-06-22 MED ORDER — AMOXICILLIN 250 MG PO CAPS
1000.0000 mg | ORAL_CAPSULE | Freq: Once | ORAL | Status: AC
  Administered 2023-06-22: 1000 mg via ORAL
  Filled 2023-06-22: qty 4

## 2023-06-22 MED ORDER — AZITHROMYCIN 250 MG PO TABS: 250.0000 mg | ORAL_TABLET | Freq: Every day | ORAL | 0 refills | Status: DC
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
## 2023-06-22 NOTE — ED Triage Notes (Signed)
 Pt c/o flu-like symptoms since yesterday. Cough and weakness. Tylenol 2 am.

## 2023-06-22 NOTE — Discharge Instructions (Signed)
 You were seen today for cough and fever.  Your viral studies are negative.  However your x-ray is concerning for pneumonia.  You will be started on antibiotics.  Make sure that you are staying hydrated.

## 2023-08-31 ENCOUNTER — Emergency Department (HOSPITAL_COMMUNITY)

## 2023-08-31 ENCOUNTER — Other Ambulatory Visit: Payer: Self-pay

## 2023-08-31 ENCOUNTER — Emergency Department (HOSPITAL_COMMUNITY)
Admission: EM | Admit: 2023-08-31 | Discharge: 2023-08-31 | Disposition: A | Discharge status: Home / Self Care | Attending: Emergency Medicine | Admitting: Emergency Medicine

## 2023-08-31 ENCOUNTER — Encounter (HOSPITAL_COMMUNITY): Payer: Self-pay

## 2023-08-31 DIAGNOSIS — I10 Essential (primary) hypertension: Secondary | ICD-10-CM | POA: Diagnosis not present

## 2023-08-31 DIAGNOSIS — Z7984 Long term (current) use of oral hypoglycemic drugs: Secondary | ICD-10-CM | POA: Insufficient documentation

## 2023-08-31 DIAGNOSIS — Z3A01 Less than 8 weeks gestation of pregnancy: Secondary | ICD-10-CM | POA: Insufficient documentation

## 2023-08-31 DIAGNOSIS — Z3490 Encounter for supervision of normal pregnancy, unspecified, unspecified trimester: Secondary | ICD-10-CM

## 2023-08-31 DIAGNOSIS — J45909 Unspecified asthma, uncomplicated: Secondary | ICD-10-CM | POA: Diagnosis not present

## 2023-08-31 DIAGNOSIS — R1030 Lower abdominal pain, unspecified: Secondary | ICD-10-CM | POA: Insufficient documentation

## 2023-08-31 DIAGNOSIS — R112 Nausea with vomiting, unspecified: Secondary | ICD-10-CM

## 2023-08-31 DIAGNOSIS — O219 Vomiting of pregnancy, unspecified: Secondary | ICD-10-CM | POA: Diagnosis present

## 2023-08-31 DIAGNOSIS — E119 Type 2 diabetes mellitus without complications: Secondary | ICD-10-CM | POA: Diagnosis not present

## 2023-08-31 DIAGNOSIS — Z79899 Other long term (current) drug therapy: Secondary | ICD-10-CM | POA: Diagnosis not present

## 2023-08-31 DIAGNOSIS — E860 Dehydration: Secondary | ICD-10-CM | POA: Insufficient documentation

## 2023-08-31 LAB — COMPREHENSIVE METABOLIC PANEL WITH GFR
ALT: 23 U/L (ref 0–44)
AST: 24 U/L (ref 15–41)
Albumin: 3.6 g/dL (ref 3.5–5.0)
Alkaline Phosphatase: 26 U/L — ABNORMAL LOW (ref 38–126)
Anion gap: 13 (ref 5–15)
BUN: 9 mg/dL (ref 6–20)
CO2: 19 mmol/L — ABNORMAL LOW (ref 22–32)
Calcium: 9.1 mg/dL (ref 8.9–10.3)
Chloride: 104 mmol/L (ref 98–111)
Creatinine, Ser: 0.54 mg/dL (ref 0.44–1.00)
GFR, Estimated: >60 mL/min (ref >60)
Glucose, Bld: 125 mg/dL — ABNORMAL HIGH (ref 70–99)
Potassium: 3.9 mmol/L (ref 3.5–5.1)
Sodium: 136 mmol/L (ref 135–145)
Total Bilirubin: 0.4 mg/dL (ref 0.0–1.2)
Total Protein: 7.3 g/dL (ref 6.5–8.1)

## 2023-08-31 LAB — HCG, QUANTITATIVE, PREGNANCY: hCG, Beta Chain, Quant, S: 58596 m[IU]/mL — ABNORMAL HIGH (ref <5)

## 2023-08-31 LAB — CBC
HCT: 36.9 % (ref 36.0–46.0)
Hemoglobin: 12.4 g/dL (ref 12.0–15.0)
MCH: 28.1 pg (ref 26.0–34.0)
MCHC: 33.6 g/dL (ref 30.0–36.0)
MCV: 83.7 fL (ref 80.0–100.0)
Platelets: 387 10*3/uL (ref 150–400)
RBC: 4.41 MIL/uL (ref 3.87–5.11)
RDW: 13.3 % (ref 11.5–15.5)
WBC: 13.2 10*3/uL — ABNORMAL HIGH (ref 4.0–10.5)
nRBC: 0 % (ref 0.0–0.2)

## 2023-08-31 LAB — URINALYSIS, ROUTINE W REFLEX MICROSCOPIC
Bacteria, UA: NONE SEEN
Bilirubin Urine: NEGATIVE
Glucose, UA: NEGATIVE mg/dL
Hgb urine dipstick: NEGATIVE
Ketones, ur: NEGATIVE mg/dL
Leukocytes,Ua: NEGATIVE
Nitrite: NEGATIVE
Protein, ur: 30 mg/dL — AB
Specific Gravity, Urine: 1.015 (ref 1.005–1.030)
pH: 8 (ref 5.0–8.0)

## 2023-08-31 LAB — PREGNANCY, URINE: Preg Test, Ur: POSITIVE — AB

## 2023-08-31 LAB — WET PREP, GENITAL
Clue Cells Wet Prep HPF POC: NONE SEEN
Sperm: NONE SEEN
Trich, Wet Prep: NONE SEEN
WBC, Wet Prep HPF POC: <10 (ref <10)
Yeast Wet Prep HPF POC: NONE SEEN

## 2023-08-31 LAB — LIPASE, BLOOD: Lipase: 65 U/L — ABNORMAL HIGH (ref 11–51)

## 2023-08-31 MED ORDER — PRENATAL VITAMIN 27-0.8 MG PO TABS: 1.0000 | ORAL_TABLET | Freq: Every day | ORAL | 0 refills | Status: AC

## 2023-08-31 MED ORDER — SODIUM CHLORIDE 0.9 % IV BOLUS
1000.0000 mL | Freq: Once | INTRAVENOUS | Status: AC
  Administered 2023-08-31: 1000 mL via INTRAVENOUS

## 2023-08-31 MED ORDER — ONDANSETRON HCL 4 MG/2ML IJ SOLN
4.0000 mg | Freq: Once | INTRAMUSCULAR | Status: AC
  Administered 2023-08-31: 4 mg via INTRAVENOUS
  Filled 2023-08-31: qty 2

## 2023-08-31 MED ORDER — ONDANSETRON 4 MG PO TBDP: 4.0000 mg | ORAL_TABLET | Freq: Three times a day (TID) | ORAL | 0 refills | Status: DC | PRN

## 2023-08-31 MED ORDER — LABETALOL HCL 100 MG PO TABS: 100.0000 mg | ORAL_TABLET | Freq: Two times a day (BID) | ORAL | 0 refills | Status: DC

## 2023-08-31 NOTE — Discharge Instructions (Addendum)
 Due date:  04/20/2024.  Stop your amlodipine -olmesartan  and spironolactone  and Start labelalol for your high blood pressure.

## 2023-08-31 NOTE — ED Notes (Signed)
 [facility] has been called. ETA 

## 2023-08-31 NOTE — ED Triage Notes (Signed)
 Pt stated that she has been having lower abd pain and back pain that started a few days ago and can't keep anything down.

## 2023-08-31 NOTE — ED Provider Notes (Signed)
 [place] EMERGENCY DEPARTMENT AT [facility] Provider Note   CSN: [id number] Arrival date & time: 08/31/23  8371     Patient presents with: Abdominal Pain   Monique Forbes is a 33 y.o. female.   Pt is a 33 yo female with pmhx significant for asthma, dm2, PCOS, anxiety, depression, PTSD, borderline personality d/o, and seizures.  Pt presents to the ED today with low abd pain and back pain.  She's not been able to keep down anything.  LMP was in April, but periods are irregular.  She did have some spotting a few days ago. Pt did eat a suspect chicken dish.  + dysuria.  Blood sugars have been a little elevated.       Prior to Admission medications   Medication Sig Start Date End Date Taking? Authorizing Provider  labetalol (NORMODYNE) 100 MG tablet Take 1 tablet (100 mg total) by mouth 2 (two) times daily. 08/31/23  Yes Dean Clarity, MD  ondansetron  (ZOFRAN -ODT) 4 MG disintegrating tablet Take 1 tablet (4 mg total) by mouth every 8 (eight) hours as needed. 08/31/23  Yes Dean Clarity, MD  Prenatal Vit-Fe Fumarate-FA (PRENATAL VITAMIN) 27-0.8 MG TABS Take 1 tablet by mouth daily. 08/31/23  Yes Dean Clarity, MD  albuterol  (VENTOLIN  HFA) 108 (90 Base) MCG/ACT inhaler Inhale 1-2 puffs into the lungs every 6 (six) hours as needed for wheezing or shortness of breath. 04/03/23   Suellen, Celeste A, PA-C  amlodipine -olmesartan  (AZOR ) 10-20 MG tablet Take 1 tablet by mouth daily. 07/10/22   Liana Fish, NP  amoxicillin  (AMOXIL ) 500 MG capsule Take 2 capsules (1,000 mg total) by mouth 3 (three) times daily. 06/22/23   Horton, Charmaine FALCON, MD  azithromycin  (ZITHROMAX ) 250 MG tablet Take 1 tablet (250 mg total) by mouth daily. Take first 2 tablets together, then 1 every day until finished. 06/22/23   Horton, Charmaine FALCON, MD  benzonatate  (TESSALON ) 100 MG capsule Take 1 capsule (100 mg total) by mouth every 8 (eight) hours. 04/03/23   Suellen Cantor A, PA-C  metFORMIN  (GLUCOPHAGE -XR)  750 MG 24 hr tablet Take 750 mg by mouth daily with breakfast. 10/30/22 10/30/23  [provider]  omeprazole  (PRILOSEC) 40 MG capsule Take 1 capsule (40 mg total) by mouth 2 (two) times daily. 07/10/22   Liana Fish, NP    Allergies: Bee venom, Cashew nut oil, Flavoring agent (non-screening), and Cashew nut (anacardium occidentale) skin test    Review of Systems  Gastrointestinal:  Positive for abdominal pain, nausea and vomiting.  Genitourinary:  Positive for dysuria.    Updated Vital Signs BP 129/76   Pulse 94   Temp 98 F (36.7 C) (Temporal)   Resp 16   Ht 5' 5 (1.651 m)   Wt 95.3 kg   SpO2 95%   BMI 34.95 kg/m   Physical Exam Vitals and nursing note reviewed.  Constitutional:      Appearance: She is well-developed. She is obese.  HENT:     Head: Normocephalic and atraumatic.     Mouth/Throat:     Mouth: Mucous membranes are dry.     Pharynx: Oropharynx is clear.   Eyes:     Extraocular Movements: Extraocular movements intact.     Pupils: Pupils are equal, round, and reactive to light.    Cardiovascular:     Rate and Rhythm: Regular rhythm. Tachycardia present.     Heart sounds: Normal heart sounds.  Pulmonary:     Effort: Pulmonary effort is normal.  Breath sounds: Normal breath sounds.  Abdominal:     General: Abdomen is flat. Bowel sounds are normal.     Palpations: Abdomen is soft.     Tenderness: There is abdominal tenderness in the suprapubic area.  Genitourinary:    Vagina: Normal.     Cervix: Normal.     Uterus: Normal.      Adnexa: Right adnexa normal and left adnexa normal.   Skin:    General: Skin is warm.     Capillary Refill: Capillary refill takes less than 2 seconds.   Neurological:     General: No focal deficit present.     Mental Status: She is alert and oriented to person, place, and time.   Psychiatric:        Mood and Affect: Mood normal.        Behavior: Behavior normal.     (all labs ordered are listed, but  only abnormal results are displayed) Labs Reviewed  LIPASE, BLOOD - Abnormal; Notable for the following components:      Result Value   Lipase 65 (*)    All other components within normal limits  COMPREHENSIVE METABOLIC PANEL WITH GFR - Abnormal; Notable for the following components:   CO2 19 (*)    Glucose, Bld 125 (*)    Alkaline Phosphatase 26 (*)    All other components within normal limits  CBC - Abnormal; Notable for the following components:   WBC 13.2 (*)    All other components within normal limits  URINALYSIS, ROUTINE W REFLEX MICROSCOPIC - Abnormal; Notable for the following components:   Protein, ur 30 (*)    All other components within normal limits  PREGNANCY, URINE - Abnormal; Notable for the following components:   Preg Test, Ur POSITIVE (*)    All other components within normal limits  HCG, QUANTITATIVE, PREGNANCY - Abnormal; Notable for the following components:   hCG, Beta Chain, Quant, S 58,596 (*)    All other components within normal limits  WET PREP, GENITAL  HIV ANTIBODY (ROUTINE TESTING W REFLEX)  GC/CHLAMYDIA PROBE AMP ([place]) NOT AT [facility]    EKG: None  Radiology: US  OB Comp < 14 Wks Result Date: 08/31/2023 CLINICAL DATA:  pregnant bleeding. Pelvic pain. Gestational age by last menstrual 7 weeks and 6 days. Estimated due date by last menstrual period 04/12/2024. Last menstrual period 07/07/2023 EXAM: OBSTETRIC <14 WK US  AND TRANSVAGINAL OB US  TECHNIQUE: Both transabdominal and transvaginal ultrasound examinations were performed for complete evaluation of the gestation as well as the maternal uterus, adnexal regions, and pelvic cul-de-sac. Transvaginal technique was performed to assess early pregnancy. COMPARISON:  None Available. FINDINGS: Intrauterine gestational sac: Single Yolk sac:  Visualized. Embryo:  Visualized. Cardiac Activity: Visualized. Heart Rate: 121  bpm CRL:  177 mm   6 w   5 d                  US  EDC: 04/20/2024 Subchorionic hemorrhage:   Small volume. Maternal uterus/adnexae: Bilateral ovaries and adnexa are unremarkable. Other: No free fluid. IMPRESSION: Single live intrauterine pregnancy with gestational age by ultrasound of 6 weeks and 5 days which is non-concordant with gestational age by last menstrual period of 7 weeks and 6 days. Electronically Signed   By: Morgane  Naveau M.D.   On: 08/31/2023 19:38   US  OB Transvaginal Result Date: 08/31/2023 CLINICAL DATA:  pregnant bleeding. Pelvic pain. Gestational age by last menstrual 7 weeks and 6 days. Estimated due date  by last menstrual period 04/12/2024. Last menstrual period 07/07/2023 EXAM: OBSTETRIC <14 WK US  AND TRANSVAGINAL OB US  TECHNIQUE: Both transabdominal and transvaginal ultrasound examinations were performed for complete evaluation of the gestation as well as the maternal uterus, adnexal regions, and pelvic cul-de-sac. Transvaginal technique was performed to assess early pregnancy. COMPARISON:  None Available. FINDINGS: Intrauterine gestational sac: Single Yolk sac:  Visualized. Embryo:  Visualized. Cardiac Activity: Visualized. Heart Rate: 121  bpm CRL:  177 mm   6 w   5 d                  US  EDC: 04/20/2024 Subchorionic hemorrhage:  Small volume. Maternal uterus/adnexae: Bilateral ovaries and adnexa are unremarkable. Other: No free fluid. IMPRESSION: Single live intrauterine pregnancy with gestational age by ultrasound of 6 weeks and 5 days which is non-concordant with gestational age by last menstrual period of 7 weeks and 6 days. Electronically Signed   By: Morgane  Naveau M.D.   On: 08/31/2023 19:38     Procedures   Medications Ordered in the ED  sodium chloride  0.9 % bolus 1,000 mL (0 mLs Intravenous Stopped 08/31/23 1906)  ondansetron  (ZOFRAN ) injection 4 mg (4 mg Intravenous Given 08/31/23 1657)                                    Medical Decision Making Amount and/or Complexity of Data Reviewed Labs: ordered. Radiology: ordered.  Risk OTC  drugs. Prescription drug management.   This patient presents to the ED for concern of abd pain and n/v, this involves an extensive number of treatment options, and is a complaint that carries with it a high risk of complications and morbidity.  The differential diagnosis includes kidney stone, pregnancy, pyelo, uti, viral syndrome   Co morbidities that complicate the patient evaluation  asthma, dm2, PCOS, anxiety, depression, PTSD, borderline personality d/o, and seizures.   Additional history obtained:  Additional history obtained from epic chart review    Lab Tests:  I Ordered, and personally interpreted labs.  The pertinent results include:  cbc with wbc elevated at 13.2; cmp nl; lip 65; urine preg nl; quant 58,596; wet prep neg; ua neg   Imaging Studies ordered:  I ordered imaging studies including US   I independently visualized and interpreted imaging which showed  Single live intrauterine pregnancy with gestational age by  ultrasound of 6 weeks and 5 days which is non-concordant with  gestational age by last menstrual period of 7 weeks and 6 days.   I agree with the radiologist interpretation   Medicines ordered and prescription drug management:  I ordered medication including ivfs/zofran   for sx  Reevaluation of the patient after these medicines showed that the patient improved I have reviewed the patients home medicines and have made adjustments as needed   Test Considered:  US    Critical Interventions:  US    Problem List / ED Course:  Early pregnancy:  pt is overjoyed as she said she's been trying to get pregnant for 15 years.  Pt is to f/u with obgyn. N/v and dehydration:  improved after fluids and meds.  She is tolerating fluids. Chronic HTN:  pt's bp med changed to labetalol Chronic DM and Depression and GERD:  according to literature review, metformin  and prilosec and celexa ok for now.   Reevaluation:  After the interventions noted above, I  reevaluated the patient and found that they have :improved  Social Determinants of Health:  Lives at home   Dispostion:  After consideration of the diagnostic results and the patients response to treatment, I feel that the patent would benefit from discharge with outpatient f/u.       Final diagnoses:  Dehydration  Early stage of pregnancy  Nausea and vomiting, unspecified vomiting type    ED Discharge Orders          Ordered    ondansetron  (ZOFRAN -ODT) 4 MG disintegrating tablet  Every 8 hours PRN        08/31/23 1933    Prenatal Vit-Fe Fumarate-FA (PRENATAL VITAMIN) 27-0.8 MG TABS  Daily        08/31/23 1933    labetalol (NORMODYNE) 100 MG tablet  2 times daily        08/31/23 1933               Dean Clarity, MD 08/31/23 2025

## 2023-08-31 NOTE — ED Notes (Signed)
 Patient transported to Ultrasound

## 2023-08-31 NOTE — ED Notes (Signed)
 ED Provider at bedside.

## 2023-08-31 NOTE — ED Notes (Signed)
 Pt returned from ultrasound

## 2023-08-31 NOTE — ED Notes (Signed)
 Pt drinking ginger ale. Stated, I feel a little nauseous but I haven't thrown up.

## 2023-09-02 LAB — GC/CHLAMYDIA PROBE AMP (~~LOC~~) NOT AT ARMC
Chlamydia: NEGATIVE
Comment: NEGATIVE
Comment: NORMAL
Neisseria Gonorrhea: NEGATIVE

## 2023-09-16 ENCOUNTER — Telehealth

## 2023-09-16 DIAGNOSIS — O0990 Supervision of high risk pregnancy, unspecified, unspecified trimester: Secondary | ICD-10-CM | POA: Insufficient documentation

## 2023-09-16 DIAGNOSIS — Z3689 Encounter for other specified antenatal screening: Secondary | ICD-10-CM

## 2023-09-16 NOTE — Progress Notes (Signed)
 New OB Intake  I connected with  Monique Forbes on 09/16/23 at  8:15 AM EDT by MyChart Video Visit and verified that I am speaking with the correct person using two identifiers. Nurse is located at [facility] and pt is located at home.  I discussed the limitations, risks, security and privacy concerns of performing an evaluation and management service by telephone and the availability of in person appointments. I also discussed with the patient that there may be a patient responsible charge related to this service. The patient expressed understanding and agreed to proceed.  I explained I am completing New OB Intake today. We discussed her EDD of 04/20/2024 that is based vagina US  she had at [facility] Pt is G7/P1. I reviewed her allergies, medications, Medical/Surgical/OB history, and appropriate screenings. There are cats in the home: yes. If yes: Indoor. Based on history, this is a/an pregnancy complicated by hypertension and multiple SAB's Uncontrolled type 2 diabetes mellitus with hyperglycemia (HCC),History of PCOS Patient Active Problem List   Diagnosis Date Noted   Supervision of high risk pregnancy, antepartum 09/16/2023   Palpitations 03/07/2022   Hyperthyroidism 03/07/2022   Class 2 severe obesity due to excess calories with serious comorbidity and body mass index (BMI) of 35.0 to 35.9 in adult [facility]) 07/30/2021   Uncontrolled type 2 diabetes mellitus with hyperglycemia (HCC) 11/22/2020   Dizziness 06/06/2019   Personal history of COVID-19 03/29/19   Essential hypertension 29-Mar-2019   Shortness of breath 29-Mar-2019   Family history of sudden cardiac death in mother 29-Mar-2019   Heart murmur 29-Mar-2019   Screening for hyperlipidemia March 29, 2019   Diabetes mellitus type 2, controlled, without complications (HCC) 08/12/2018   Neck swelling 08/12/2018   Pain, dental 08/12/2018   PCOS (polycystic ovarian syndrome) 08/12/2018   Sepsis (HCC) 08/12/2018   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  Anatomy US  Anatomy US  will be scheduled around [redacted] weeks gestational age.  Labs Discussed genetic screening with patient. Patient would like genetic testing to be drawn at new OB visit. Discussed possible labs to be drawn at new OB appointment.  COVID Vaccine Patient has had COVID vaccine.   Social Determinants of Health Food Insecurity: denies food insecurity WIC Referral: Patient is interested in referral to [facility] [place].  Transportation: Patient denies transportation needs. Childcare: Discussed no children allowed at ultrasound appointments.   First visit review I reviewed new OB appt with pt. I explained she will have blood work and pap smear/pelvic exam if indicated. Explained pt will be seen by Jinnie Cookey  at first visit; encounter routed to appropriate provider.   [name], CMA 09/16/2023  8:41 AM

## 2023-09-20 ENCOUNTER — Telehealth: Payer: Self-pay | Admitting: Adult Health

## 2023-09-20 NOTE — Telephone Encounter (Signed)
 Patient called stating that she is high risk, hasn't been seen yet, complains of nassua and dizziness, missing work and needs work note. Placed her on hold to get nurse no one avible informed pt  that will send msg to call her back

## 2023-09-23 ENCOUNTER — Telehealth: Payer: Self-pay

## 2023-09-23 NOTE — Telephone Encounter (Signed)
 Patient would like for a nurse to call her; she is concerned about her HCG levels dropping.

## 2023-09-23 NOTE — ED Provider Notes (Signed)
 Emergency Department Provider Note    ED Clinical Impression   Final diagnoses:  Abdominal pain during pregnancy, antepartum (HHS-HCC) (Primary)  Rash    ED Assessment/Plan    Condition: Stable Disposition: Discharge  This chart has been completed using Dragon Medical Dictation software, and while attempts have been made to ensure accuracy, certain words and phrases may not be transcribed as intended.   History   Chief Complaint  Patient presents with  . Abdominal Pain    Abdominal pain x 1 hour.  . Rash    Possible shingles on right and left flank.    HPI  Monique Forbes is a 33 y.o. female  who presents today to the  emergency department complaining of a rash in both the right and the left flank which she noticed 2 days ago.  She describes the rash as itching.  Patient also describes slight cramping pelvic discomfort.  She is G1 P0, about [redacted] weeks pregnant.  She has no documented IUP.  Ultrasound done on June 21 confirmed that.  She denies any vaginal bleeding.    Allergies: is allergic to berry flavor, cashew nut, other, venom-honey bee, and trazodone. Medications: has a current medication list which includes the following long-term medication(s): albuterol , epinephrine , labetalol , metformin , and spironolactone . PMHx:  has a past medical history of Diabetes mellitus, Hypertension, PCOS (polycystic ovarian syndrome), and Seizures. PSHx:  has no past surgical history on file. SocHx:  reports that she has never smoked. She has never been exposed to tobacco smoke. She has never used smokeless tobacco. She reports that she does not drink alcohol and does not use drugs. Allergies, Medications, Medical, Surgical, and Social History were reviewed as documented above.   Social Drivers of Health with Concerns   Utilities: At Risk (09/12/2023)   Received from [facility] Util[facility]   . In  the past 12 months has the electric, gas, oil, or water company threatened to shut off services in your home?: Yes  Social Connections: Moderately Isolated (09/16/2023)   Received from [facility]   Social Connection and Isolation Panel   . In a typical week, how many times do you talk on the phone with family, friends, or neighbors?: Once a week   . How often do you get together with friends or relatives?: Never   . How often do you attend church or religious services?: 1 to 4 times per year   . Do you belong to any clubs or organizations such as church groups, unions, fraternal or athletic groups, or school groups?: No   . How often do you attend meetings of the clubs or organizations you belong to?: Never   . Are you married, widowed, divorced, separated, never married, or living with a partner?: Married  Insurance account manager: Not on file     Review Of Systems  Review of Systems  Constitutional:  Negative for fever.  HENT:  Negative for congestion.   Respiratory:  Negative for chest tightness and shortness of breath.   Cardiovascular:  Negative for chest pain.  Gastrointestinal:  Negative for abdominal pain.  Genitourinary:  Positive for pelvic pain. Negative for vaginal bleeding and vaginal discharge.  Skin:  Positive for rash. Negative for color change.  Psychiatric/Behavioral:  Negative for behavioral problems.   All other  systems reviewed and are negative.   Physical Exam   BP 136/84   Pulse 92   Temp 36.2 C (97.1 F) (Temporal)   Resp 18   Ht 165.1 cm (5' 5)   Wt 100.4 kg (221 lb 6.4 oz)   LMP 07/07/2023 (Exact Date)   SpO2 97%   BMI 36.84 kg/m   Physical Exam Vitals and nursing note reviewed.  Constitutional:      General: She is not in acute distress. HENT:     Head: Normocephalic.  Eyes:     Conjunctiva/sclera: Conjunctivae normal.  Cardiovascular:     Rate and Rhythm: Regular rhythm.     Pulses: Normal pulses.     Heart sounds: Normal heart sounds.   Pulmonary:     Effort: No respiratory distress.     Breath sounds: Normal breath sounds.  Abdominal:     General: There is no distension.     Tenderness: There is no abdominal tenderness. There is no right CVA tenderness or guarding.     Comments: Abdomen is soft, nontender, normal active bowel sounds.  Musculoskeletal:        General: No deformity.  Skin:    General: Skin is warm.     Capillary Refill: Capillary refill takes 2 to 3 seconds.     Findings: Rash present.     Comments: Normal cap refill.  Patient has 1 area for macular blanching rash on the left posterior flank and 1 area on the right side also.  I do not appreciate any vesicles or dermatomal pattern to suggest zoster.  No petechia or purpura.  Neurological:     General: No focal deficit present.     Mental Status: She is oriented to person, place, and time.     Cranial Nerves: No cranial nerve deficit.     Sensory: No sensory deficit.  Psychiatric:        Mood and Affect: Mood normal.     ED Course  Medical Decision Making Clinically patient is discomfort of pregnancy.  Will check basic labs. 2.  Possible contact dermatitis.  No clinical suspicion for zoster especially given that rash is on both sides, and there are no vesicular lesions.  Patient has known, documented IUP.  Fetal heart tones states in the 180s to 190s.  Consequently, there is no indication for emergent/urgent ultrasound.  No clinical suspicion for ectopic pregnancy or threatened AB.  4:12 AM Patient is doing well.  She is asymptomatic.  She feels fine.  She is stable for discharge.  I have reviewed my clinical findings and studies and my clinical impression with the patient. The patient has expressed understanding that at this time there is no evidence for a more malignant underlying process, but the patient also understands that early in the process of a condition such as this, an initial workup can be falsely reassuring. I have counseled the  patient and discussed follow-up with the patient, stressing the importance of appropriate follow-up. I have also counseled the patient to return if worse or any concerns. Routine discharge counseling was given to the patient and the patient understands that worsening, changing or persistent symptoms should prompt an immediate call or follow up with their primary physician or return to the emergency department for reevaluation. Patient has expressed understanding.     Problems Addressed: Abdominal pain during pregnancy, antepartum (HHS-HCC): acute illness or injury that poses a threat to life or bodily functions Rash: acute illness or injury  Amount and/or  Complexity of Data Reviewed Labs: ordered. Decision-making details documented in ED Course.  Risk OTC drugs. Decision regarding hospitalization.     Procedures   No results found for this visit on 09/23/23 (from the past 4464 hours).   ED Results Results for orders placed or performed during the hospital encounter of 09/23/23  Comprehensive Metabolic Panel  Result Value Ref Range   Sodium 135 135 - 145 mmol/L   Potassium 3.6 3.5 - 5.0 mmol/L   Chloride 101 98 - 107 mmol/L   CO2 22.0 21.0 - 32.0 mmol/L   Anion Gap 12 (H) 3 - 11 mmol/L   BUN 7 (L) 8 - 20 mg/dL   Creatinine 9.56 (L) 9.39 - 1.10 mg/dL   BUN/Creatinine Ratio 16    eGFR CKD-EPI (2021) Female >90 >=60 mL/min/1.59m2   Glucose 119 70 - 179 mg/dL   Calcium 9.2 8.5 - 89.8 mg/dL   Albumin 3.2 (L) 3.5 - 5.0 g/dL   Total Protein 7.1 6.0 - 8.0 g/dL   Total Bilirubin 0.4 0.3 - 1.2 mg/dL   AST 9 (L) 15 - 40 U/L   ALT 16 12 - 78 U/L   Alkaline Phosphatase 24 (L) 46 - 116 U/L  Lipase  Result Value Ref Range   Lipase 89 (H) 16 - 77 U/L  hCG QUANTitative, Blood  Result Value Ref Range   hCG Quantitative 85,572.0 mIU/mL  Urinalysis with Microscopy with Culture Reflex  Result Value Ref Range   Color, UA Light Yellow    Clarity, UA Clear Clear   Specific Gravity, UA  1.017 1.010 - 1.025   pH, UA 5.5 5.0 - 8.0   Leukocyte Esterase, UA Negative Negative   Nitrite, UA Negative Negative   Protein, UA Trace (A) Negative   Glucose, UA Negative Negative, Trace   Ketones, UA Negative Negative   Urobilinogen, UA <2.0 mg/dL <7.9 mg/dL   Bilirubin, UA Negative Negative   Blood, UA Negative Negative   RBC, UA 0 0 - 3 /HPF   WBC, UA <1 0 - 3 /HPF   Squam Epithel, UA 1 0 - 10 /HPF   Bacteria, UA Rare (A) None Seen /HPF   WBC Clumps None Seen None Seen /HPF   Hyphal Yeast None Seen None Seen /HPF   Yeast, UA None Seen None Seen /HPF  CBC w/ Differential  Result Value Ref Range   WBC 13.1 (H) 4.0 - 10.5 10*9/L   RBC 4.21 3.80 - 5.10 10*12/L   HGB 11.2 (L) 11.5 - 15.0 g/dL   HCT 66.1 (L) 65.9 - 55.9 %   MCV 80.3 80.0 - 98.0 fL   MCH 26.6 (L) 27.0 - 34.0 pg   MCHC 33.1 32.0 - 36.0 g/dL   RDW 86.6 88.4 - 85.4 %   MPV 9.0 7.4 - 10.4 fL   Platelet 344 140 - 415 10*9/L   Neutrophils % 56.3 %   Lymphocytes % 29.6 %   Monocytes % 6.0 %   Eosinophils % 6.5 %   Basophils % 0.8 %   Absolute Neutrophils 7.4 1.8 - 7.8 10*9/L   Absolute Lymphocytes 3.9 0.7 - 4.5 10*9/L   Absolute Monocytes 0.8 0.1 - 1.0 10*9/L   Absolute Eosinophils 0.9 (H) 0.0 - 0.4 10*9/L   Absolute Basophils 0.1 0.0 - 0.2 10*9/L   No results found.  Medications Administered: Medications - No data to display  Discharge Medications (Medications Prescribed during this  ED visit and Patient's Home Medications) :  Your Medication List     ASK your doctor about these medications    acetaminophen 325 MG tablet Commonly known as: Tylenol Take 2 tablets (650 mg total) by mouth every six (6) hours as needed for pain.   albuterol  90 mcg/actuation inhaler Commonly known as: PROVENTIL  HFA;VENTOLIN  HFA Inhale 1-2 puffs every four (4) hours as needed.   EPINEPHrine  0.3 mg/0.3 mL injection Commonly known as: EPIPEN  Inject 0.3 mL (0.3 mg total) into the muscle once for 1 dose.   labetalol   100 MG tablet Commonly known as: NORMODYNE  Take 1 tablet (100 mg total) by mouth two (2) times a day.   metFORMIN  750 MG 24 hr tablet Commonly known as: GLUCOPHAGE -XR Take 1 tablet (750 mg total) by mouth daily before breakfast.   omeprazole  40 MG capsule Commonly known as: PriLOSEC Take 1 capsule (40 mg total) by mouth daily.   PRENATAL VITAMIN PLUS LOW IRON 27 mg iron- 1 mg Tab Generic drug: multivitamin, prenatal (folic acid -iron) Take 1 tablet by mouth daily.   spironolactone  25 MG tablet Commonly known as: ALDACTONE  Take 1 tablet (25 mg total) by mouth daily.          Cherie Ardeen Hanger, MD 09/23/23 [phone]

## 2023-09-23 NOTE — Telephone Encounter (Signed)
 Pt calling with concerns about her hcg levels. It was 96,388 on 6/28  and 85,572 today at [facility]. She wants to make sure that this is okay. She has had a confirmed IUP on u/s 6/21. She comes for her new ob next Monday. Anything that she should be concerned about with the hcg levels?

## 2023-09-23 NOTE — ED Triage Notes (Addendum)
 Patient states she had a rash appear on side/flank areas x 2 days ago, patient believes it could be shingles. Patient states she has lower abdominal pan x 1 hour today. Patient states she is [redacted] weeks pregnant and denies any vaginal bleeding.

## 2023-09-30 ENCOUNTER — Ambulatory Visit: Admitting: Women's Health

## 2023-09-30 ENCOUNTER — Other Ambulatory Visit (HOSPITAL_COMMUNITY)
Admission: RE | Admit: 2023-09-30 | Discharge: 2023-09-30 | Disposition: A | Discharge status: Home / Self Care | Source: Ambulatory Visit | Attending: Women's Health | Admitting: Women's Health

## 2023-09-30 ENCOUNTER — Ambulatory Visit

## 2023-09-30 ENCOUNTER — Encounter: Payer: Self-pay | Admitting: Women's Health

## 2023-09-30 ENCOUNTER — Encounter: Admitting: *Deleted

## 2023-09-30 VITALS — BP 135/91 | HR 92 | Wt 220.0 lb

## 2023-09-30 DIAGNOSIS — Z3481 Encounter for supervision of other normal pregnancy, first trimester: Secondary | ICD-10-CM

## 2023-09-30 DIAGNOSIS — Z124 Encounter for screening for malignant neoplasm of cervix: Secondary | ICD-10-CM | POA: Diagnosis present

## 2023-09-30 DIAGNOSIS — E119 Type 2 diabetes mellitus without complications: Secondary | ICD-10-CM | POA: Diagnosis not present

## 2023-09-30 DIAGNOSIS — Z3401 Encounter for supervision of normal first pregnancy, first trimester: Secondary | ICD-10-CM

## 2023-09-30 DIAGNOSIS — O0990 Supervision of high risk pregnancy, unspecified, unspecified trimester: Secondary | ICD-10-CM

## 2023-09-30 DIAGNOSIS — Z1332 Encounter for screening for maternal depression: Secondary | ICD-10-CM | POA: Diagnosis not present

## 2023-09-30 DIAGNOSIS — F32A Depression, unspecified: Secondary | ICD-10-CM

## 2023-09-30 DIAGNOSIS — B009 Herpesviral infection, unspecified: Secondary | ICD-10-CM

## 2023-09-30 DIAGNOSIS — Z62810 Personal history of physical and sexual abuse in childhood: Secondary | ICD-10-CM

## 2023-09-30 DIAGNOSIS — O0991 Supervision of high risk pregnancy, unspecified, first trimester: Secondary | ICD-10-CM

## 2023-09-30 DIAGNOSIS — Z8639 Personal history of other endocrine, nutritional and metabolic disease: Secondary | ICD-10-CM

## 2023-09-30 DIAGNOSIS — Z3A11 11 weeks gestation of pregnancy: Secondary | ICD-10-CM | POA: Insufficient documentation

## 2023-09-30 DIAGNOSIS — F419 Anxiety disorder, unspecified: Secondary | ICD-10-CM

## 2023-09-30 DIAGNOSIS — O10919 Unspecified pre-existing hypertension complicating pregnancy, unspecified trimester: Secondary | ICD-10-CM

## 2023-09-30 DIAGNOSIS — O10911 Unspecified pre-existing hypertension complicating pregnancy, first trimester: Secondary | ICD-10-CM

## 2023-09-30 DIAGNOSIS — Z31430 Encounter of female for testing for genetic disease carrier status for procreative management: Secondary | ICD-10-CM

## 2023-09-30 LAB — POCT URINALYSIS DIPSTICK OB
Glucose, UA: NEGATIVE
Ketones, UA: NEGATIVE
Leukocytes, UA: NEGATIVE
Nitrite, UA: NEGATIVE

## 2023-09-30 MED ORDER — DEXCOM G7 SENSOR MISC: 1.0000 | Freq: Every day | 3 refills | Status: DC

## 2023-09-30 MED ORDER — ASPIRIN 81 MG PO TBEC: 162.0000 mg | DELAYED_RELEASE_TABLET | Freq: Every day | ORAL | 2 refills | Status: DC
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
## 2023-09-30 NOTE — Patient Instructions (Addendum)
 Monique Forbes, thank you for choosing our office today! We appreciate the opportunity to meet your healthcare needs. You may receive a short survey by mail, e-mail, or through [employer]. If you are happy with your care we would appreciate if you could take just a few minutes to complete the survey questions. We read all of your comments and take your feedback very seriously. Thank you again for choosing our office.  [facility] [employer] Team at [facility]  [facility] at [facility] ([address] [place], [place] [address]) Entrance C, located off of [address] Free 24/7 valet parking   [place] BLOOD SUGARS Check blood sugars 4 times a day: in the morning before eating/drinking anything (goal is <95) and 2 hours after your first bite of breakfast, lunch, and supper (goal is <120). Please keep a log (Example 08/08/20: 95, 120, 120, 120) and bring to each appointment.   Nausea & Vomiting Have saltine crackers or pretzels by your bed and eat a few bites before you raise your head out of bed in the morning Eat small frequent meals throughout the day instead of large meals Drink plenty of fluids throughout the day to stay hydrated, just don't drink a lot of fluids with your meals.  This can make your stomach fill up faster making you feel sick Do not brush your teeth right after you eat Products with real ginger are good for nausea, like ginger ale and ginger hard candy Make sure it says made with real ginger! Sucking on sour candy like lemon heads is also good for nausea If your prenatal vitamins make you nauseated, take them at night so you will sleep through the nausea Sea Bands If you feel like you need medicine for the nausea & vomiting please let us  know If you are unable to keep any fluids or food down please let us  know   Constipation Drink plenty of fluid, preferably water, throughout the day Eat foods high in fiber such as fruits, vegetables, and grains Exercise, such  as walking, is a good way to keep your bowels regular Drink warm fluids, especially warm prune juice, or decaf coffee Eat a 1/2 cup of real oatmeal (not instant), 1/2 cup applesauce, and 1/2-1 cup warm prune juice every day If needed, you may take Colace (docusate sodium) stool softener once or twice a day to help keep the stool soft.  If you still are having problems with constipation, you may take Miralax once daily as needed to help keep your bowels regular.   Home Blood Pressure Monitoring for Patients   Your provider has recommended that you check your blood pressure (BP) at least once a week at home. If you do not have a blood pressure cuff at home, one will be provided for you. Contact your provider if you have not received your monitor within 1 week.   Helpful Tips for Accurate Home Blood Pressure Checks  Don't smoke, exercise, or drink caffeine 30 minutes before checking your BP Use the restroom before checking your BP (a full bladder can raise your pressure) Relax in a comfortable upright chair Feet on the ground Left arm resting comfortably on a flat surface at the level of your heart Legs uncrossed Back supported Sit quietly and don't talk Place the cuff on your bare arm Adjust snuggly, so that only two fingertips can fit between your skin and the top of the cuff Check 2 readings separated by at least one minute Keep  a log of your BP readings For a visual, please reference this diagram: http://ccnc.care/bpdiagram  Provider Name: [facility]     Phone: [phone]  Zone 1: ALL CLEAR  Continue to monitor your symptoms:  BP reading is less than 140 (top number) or less than 90 (bottom number)  No right upper stomach pain No headaches or seeing spots No feeling nauseated or throwing up No swelling in face and hands  Zone 2: CAUTION Call your doctor's office for any of the following:  BP reading is greater than 140 (top number) or greater than 90 (bottom number)   Stomach pain under your ribs in the middle or right side Headaches or seeing spots Feeling nauseated or throwing up Swelling in face and hands  Zone 3: EMERGENCY  Seek immediate medical care if you have any of the following:  BP reading is greater than160 (top number) or greater than 110 (bottom number) Severe headaches not improving with Tylenol Serious difficulty catching your breath Any worsening symptoms from Zone 2    First Trimester of Pregnancy The first trimester of pregnancy is from week 1 until the end of week 12 (months 1 through 3). A week after a sperm fertilizes an egg, the egg will implant on the wall of the uterus. This embryo will begin to develop into a baby. Genes from you and your partner are forming the baby. The female genes determine whether the baby is a boy or a girl. At 6-8 weeks, the eyes and face are formed, and the heartbeat can be seen on ultrasound. At the end of 12 weeks, all the baby's organs are formed.  Now that you are pregnant, you will want to do everything you can to have a healthy baby. Two of the most important things are to get good prenatal care and to follow your health care provider's instructions. Prenatal care is all the medical care you receive before the baby's birth. This care will help prevent, find, and treat any problems during the pregnancy and childbirth. BODY CHANGES Your body goes through many changes during pregnancy. The changes vary from woman to woman.  You may gain or lose a couple of pounds at first. You may feel sick to your stomach (nauseous) and throw up (vomit). If the vomiting is uncontrollable, call your health care provider. You may tire easily. You may develop headaches that can be relieved by medicines approved by your health care provider. You may urinate more often. Painful urination may mean you have a bladder infection. You may develop heartburn as a result of your pregnancy. You may develop constipation because  certain hormones are causing the muscles that push waste through your intestines to slow down. You may develop hemorrhoids or swollen, bulging veins (varicose veins). Your breasts may begin to grow larger and become tender. Your nipples may stick out more, and the tissue that surrounds them (areola) may become darker. Your gums may bleed and may be sensitive to brushing and flossing. Dark spots or blotches (chloasma, mask of pregnancy) may develop on your face. This will likely fade after the baby is born. Your menstrual periods will stop. You may have a loss of appetite. You may develop cravings for certain kinds of food. You may have changes in your emotions from day to day, such as being excited to be pregnant or being concerned that something may go wrong with the pregnancy and baby. You may have more vivid and strange dreams. You may have changes in  your hair. These can include thickening of your hair, rapid growth, and changes in texture. Some women also have hair loss during or after pregnancy, or hair that feels dry or thin. Your hair will most likely return to normal after your baby is born. WHAT TO EXPECT AT YOUR PRENATAL VISITS During a routine prenatal visit: You will be weighed to make sure you and the baby are growing normally. Your blood pressure will be taken. Your abdomen will be measured to track your baby's growth. The fetal heartbeat will be listened to starting around week 10 or 12 of your pregnancy. Test results from any previous visits will be discussed. Your health care provider may ask you: How you are feeling. If you are feeling the baby move. If you have had any abnormal symptoms, such as leaking fluid, bleeding, severe headaches, or abdominal cramping. If you have any questions. Other tests that may be performed during your first trimester include: Blood tests to find your blood type and to check for the presence of any previous infections. They will also be used  to check for low iron levels (anemia) and Rh antibodies. Later in the pregnancy, blood tests for diabetes will be done along with other tests if problems develop. Urine tests to check for infections, diabetes, or protein in the urine. An ultrasound to confirm the proper growth and development of the baby. An amniocentesis to check for possible genetic problems. Fetal screens for spina bifida and Down syndrome. You may need other tests to make sure you and the baby are doing well. HOME CARE INSTRUCTIONS  Medicines Follow your health care provider's instructions regarding medicine use. Specific medicines may be either safe or unsafe to take during pregnancy. Take your prenatal vitamins as directed. If you develop constipation, try taking a stool softener if your health care provider approves. Diet Eat regular, well-balanced meals. Choose a variety of foods, such as meat or vegetable-based protein, fish, milk and low-fat dairy products, vegetables, fruits, and whole grain breads and cereals. Your health care provider will help you determine the amount of weight gain that is right for you. Avoid raw meat and uncooked cheese. These carry germs that can cause birth defects in the baby. Eating four or five small meals rather than three large meals a day may help relieve nausea and vomiting. If you start to feel nauseous, eating a few soda crackers can be helpful. Drinking liquids between meals instead of during meals also seems to help nausea and vomiting. If you develop constipation, eat more high-fiber foods, such as fresh vegetables or fruit and whole grains. Drink enough fluids to keep your urine clear or pale yellow. Activity and Exercise Exercise only as directed by your health care provider. Exercising will help you: Control your weight. Stay in shape. Be prepared for labor and delivery. Experiencing pain or cramping in the lower abdomen or low back is a good sign that you should stop  exercising. Check with your health care provider before continuing normal exercises. Try to avoid standing for long periods of time. Move your legs often if you must stand in one place for a long time. Avoid heavy lifting. Wear low-heeled shoes, and practice good posture. You may continue to have sex unless your health care provider directs you otherwise. Relief of Pain or Discomfort Wear a good support bra for breast tenderness.   Take warm sitz baths to soothe any pain or discomfort caused by hemorrhoids. Use hemorrhoid cream if your health care provider  approves.   Rest with your legs elevated if you have leg cramps or low back pain. If you develop varicose veins in your legs, wear support hose. Elevate your feet for 15 minutes, 3-4 times a day. Limit salt in your diet. Prenatal Care Schedule your prenatal visits by the twelfth week of pregnancy. They are usually scheduled monthly at first, then more often in the last 2 months before delivery. Write down your questions. Take them to your prenatal visits. Keep all your prenatal visits as directed by your health care provider. Safety Wear your seat belt at all times when driving. Make a list of emergency phone numbers, including numbers for family, friends, the hospital, and police and fire departments. General Tips Ask your health care provider for a referral to a local prenatal education class. Begin classes no later than at the beginning of month 6 of your pregnancy. Ask for help if you have counseling or nutritional needs during pregnancy. Your health care provider can offer advice or refer you to specialists for help with various needs. Do not use hot tubs, steam rooms, or saunas. Do not douche or use tampons or scented sanitary pads. Do not cross your legs for long periods of time. Avoid cat litter boxes and soil used by cats. These carry germs that can cause birth defects in the baby and possibly loss of the fetus by miscarriage or  stillbirth. Avoid all smoking, herbs, alcohol, and medicines not prescribed by your health care provider. Chemicals in these affect the formation and growth of the baby. Schedule a dentist appointment. At home, brush your teeth with a soft toothbrush and be gentle when you floss. SEEK MEDICAL CARE IF:  You have dizziness. You have mild pelvic cramps, pelvic pressure, or nagging pain in the abdominal area. You have persistent nausea, vomiting, or diarrhea. You have a bad smelling vaginal discharge. You have pain with urination. You notice increased swelling in your face, hands, legs, or ankles. SEEK IMMEDIATE MEDICAL CARE IF:  You have a fever. You are leaking fluid from your vagina. You have spotting or bleeding from your vagina. You have severe abdominal cramping or pain. You have rapid weight gain or loss. You vomit blood or material that looks like coffee grounds. You are exposed to [place] measles and have never had them. You are exposed to fifth disease or chickenpox. You develop a severe headache. You have shortness of breath. You have any kind of trauma, such as from a fall or a car accident. Document Released: 02/20/2001 Document Revised: 07/13/2013 Document Reviewed: 01/06/2013 [facility] Patient Information 2015 [place], [place]. This information is not intended to replace advice given to you by your health care provider. Make sure you discuss any questions you have with your health care provider.

## 2023-09-30 NOTE — Progress Notes (Signed)
 poc

## 2023-09-30 NOTE — Addendum Note (Signed)
 Addended by: BERNARDO ALAN CROME on: 09/30/2023 05:01 PM   Modules accepted: Orders

## 2023-10-01 ENCOUNTER — Encounter: Admitting: Licensed Practical Nurse

## 2023-10-01 ENCOUNTER — Other Ambulatory Visit: Payer: Self-pay | Admitting: Obstetrics and Gynecology

## 2023-10-01 MED ORDER — FREESTYLE LIBRE 3 SENSOR MISC
1.0000 | Freq: Every day | 3 refills | Status: DC
Start: 2023-10-01 — End: 2023-11-01

## 2023-10-02 ENCOUNTER — Ambulatory Visit: Payer: Self-pay | Admitting: Women's Health

## 2023-10-02 DIAGNOSIS — O0990 Supervision of high risk pregnancy, unspecified, unspecified trimester: Secondary | ICD-10-CM

## 2023-10-02 DIAGNOSIS — O10919 Unspecified pre-existing hypertension complicating pregnancy, unspecified trimester: Secondary | ICD-10-CM

## 2023-10-02 LAB — CBC/D/PLT+RPR+RH+ABO+RUBIGG...
Antibody Screen: NEGATIVE
Basophils Absolute: 0.1 x10E3/uL (ref 0.0–0.2)
Basos: 0 %
EOS (ABSOLUTE): 0.6 x10E3/uL — ABNORMAL HIGH (ref 0.0–0.4)
Eos: 5 %
HCV Ab: NONREACTIVE
HIV Screen 4th Generation wRfx: NONREACTIVE
Hematocrit: 36.5 % (ref 34.0–46.6)
Hemoglobin: 11.8 g/dL (ref 11.1–15.9)
Hepatitis B Surface Ag: NEGATIVE
Immature Grans (Abs): 0.1 x10E3/uL (ref 0.0–0.1)
Immature Granulocytes: 1 %
Lymphocytes Absolute: 2.9 x10E3/uL (ref 0.7–3.1)
Lymphs: 24 %
MCH: 27.4 pg (ref 26.6–33.0)
MCHC: 32.3 g/dL (ref 31.5–35.7)
MCV: 85 fL (ref 79–97)
Monocytes Absolute: 0.6 x10E3/uL (ref 0.1–0.9)
Monocytes: 5 %
Neutrophils Absolute: 7.8 x10E3/uL — ABNORMAL HIGH (ref 1.4–7.0)
Neutrophils: 65 %
Platelets: 347 x10E3/uL (ref 150–450)
RBC: 4.3 x10E6/uL (ref 3.77–5.28)
RDW: 13.2 % (ref 11.7–15.4)
RPR Ser Ql: NONREACTIVE
Rh Factor: POSITIVE
Rubella Antibodies, IGG: 1.8 {index} (ref >0.99)
WBC: 12 x10E3/uL — ABNORMAL HIGH (ref 3.4–10.8)

## 2023-10-02 LAB — SPECIMEN STATUS REPORT

## 2023-10-02 LAB — COMPREHENSIVE METABOLIC PANEL WITH GFR
ALT: 9 IU/L (ref 0–32)
AST: 12 IU/L (ref 0–40)
Albumin: 4 g/dL (ref 3.9–4.9)
Alkaline Phosphatase: 29 IU/L — ABNORMAL LOW (ref 44–121)
BUN/Creatinine Ratio: 15 (ref 9–23)
BUN: 7 mg/dL (ref 6–20)
Bilirubin Total: <0.2 mg/dL (ref 0.0–1.2)
CO2: 16 mmol/L — ABNORMAL LOW (ref 20–29)
Calcium: 9.3 mg/dL (ref 8.7–10.2)
Chloride: 101 mmol/L (ref 96–106)
Creatinine, Ser: 0.48 mg/dL — ABNORMAL LOW (ref 0.57–1.00)
Globulin, Total: 2.8 g/dL (ref 1.5–4.5)
Glucose: 116 mg/dL — ABNORMAL HIGH (ref 70–99)
Potassium: 4.1 mmol/L (ref 3.5–5.2)
Sodium: 135 mmol/L (ref 134–144)
Total Protein: 6.8 g/dL (ref 6.0–8.5)
eGFR: 127 mL/min/1.73 (ref >59)

## 2023-10-02 LAB — HCV INTERPRETATION

## 2023-10-02 LAB — HEMOGLOBIN A1C
Est. average glucose Bld gHb Est-mCnc: 126 mg/dL
Hgb A1c MFr Bld: 6 % — ABNORMAL HIGH (ref 4.8–5.6)

## 2023-10-02 LAB — URINE CULTURE

## 2023-10-02 LAB — PROTEIN / CREATININE RATIO, URINE
Creatinine, Urine: 168.1 mg/dL
Protein, Ur: 36.5 mg/dL
Protein/Creat Ratio: 217 mg/g{creat} — AB (ref 0–200)

## 2023-10-03 LAB — SPECIMEN STATUS REPORT

## 2023-10-03 LAB — CYTOLOGY - PAP
Chlamydia: NEGATIVE
Comment: NEGATIVE
Comment: NEGATIVE
Comment: NORMAL
Diagnosis: NEGATIVE
High risk HPV: NEGATIVE
Neisseria Gonorrhea: NEGATIVE

## 2023-10-03 LAB — TSH: TSH: 1.1 u[IU]/mL (ref 0.450–4.500)

## 2023-10-04 ENCOUNTER — Ambulatory Visit

## 2023-10-07 ENCOUNTER — Telehealth: Admitting: *Deleted

## 2023-10-07 ENCOUNTER — Ambulatory Visit

## 2023-10-07 VITALS — BP 136/82 | HR 87

## 2023-10-07 DIAGNOSIS — O10919 Unspecified pre-existing hypertension complicating pregnancy, unspecified trimester: Secondary | ICD-10-CM

## 2023-10-07 DIAGNOSIS — Z0130 Encounter for examination of blood pressure without abnormal findings: Secondary | ICD-10-CM

## 2023-10-07 DIAGNOSIS — O0990 Supervision of high risk pregnancy, unspecified, unspecified trimester: Secondary | ICD-10-CM

## 2023-10-07 LAB — PANORAMA PRENATAL TEST FULL PANEL:PANORAMA TEST PLUS 5 ADDITIONAL MICRODELETIONS: FETAL FRACTION: 7.9

## 2023-10-07 NOTE — Progress Notes (Signed)
   NURSE VISIT- BLOOD PRESSURE CHECK  I connected with Monique Forbes on 10/07/2023 by and verified that I am speaking with the correct person using two identifiers.   I discussed the limitations of evaluation and management by telemedicine. The patient expressed understanding and agreed to proceed.  Nurse is at the office, and patient is at home.  SUBJECTIVE:  Monique Forbes is a 33 y.o. [id number] female here for BP check.    HYPERTENSION ROS:  Pregnant/postpartum:  Severe headaches that don't go away with tylenol/other medicines: No  Visual changes (seeing spots/double/blurred vision) No  Severe pain under right breast breast or in center of upper chest No  Severe nausea/vomiting Yes  Taking medicines as instructed yes   OBJECTIVE:  BP 136/82   Pulse 87   LMP 02/18/2023 (Approximate)   Appearance alert, well appearing, and in no distress.  ASSESSMENT: Pregnancy [redacted]w[redacted]d  blood pressure check  PLAN: Discussed with Dr. Ozan. Pt reports left arm pain, off and on. Can try Bengay.   Recommendations: no changes needed   Follow-up: as scheduled.   Monique Forbes  10/07/2023 3:54 PM

## 2023-10-12 LAB — HORIZON CUSTOM: REPORT SUMMARY: NEGATIVE

## 2023-10-23 ENCOUNTER — Encounter: Payer: Self-pay | Admitting: Women's Health

## 2023-10-25 ENCOUNTER — Encounter (HOSPITAL_COMMUNITY): Payer: Self-pay | Admitting: Emergency Medicine

## 2023-10-25 ENCOUNTER — Emergency Department (HOSPITAL_COMMUNITY): Admission: EM | Admit: 2023-10-25 | Discharge: 2023-10-25 | Disposition: A | Discharge status: Home / Self Care

## 2023-10-25 ENCOUNTER — Other Ambulatory Visit: Payer: Self-pay

## 2023-10-25 DIAGNOSIS — Z3A14 14 weeks gestation of pregnancy: Secondary | ICD-10-CM | POA: Insufficient documentation

## 2023-10-25 DIAGNOSIS — R112 Nausea with vomiting, unspecified: Secondary | ICD-10-CM

## 2023-10-25 DIAGNOSIS — O24112 Pre-existing diabetes mellitus, type 2, in pregnancy, second trimester: Secondary | ICD-10-CM | POA: Insufficient documentation

## 2023-10-25 DIAGNOSIS — Z79899 Other long term (current) drug therapy: Secondary | ICD-10-CM | POA: Diagnosis not present

## 2023-10-25 DIAGNOSIS — Z7982 Long term (current) use of aspirin: Secondary | ICD-10-CM | POA: Diagnosis not present

## 2023-10-25 DIAGNOSIS — O10012 Pre-existing essential hypertension complicating pregnancy, second trimester: Secondary | ICD-10-CM | POA: Diagnosis not present

## 2023-10-25 DIAGNOSIS — O219 Vomiting of pregnancy, unspecified: Secondary | ICD-10-CM | POA: Insufficient documentation

## 2023-10-25 DIAGNOSIS — Z7984 Long term (current) use of oral hypoglycemic drugs: Secondary | ICD-10-CM | POA: Insufficient documentation

## 2023-10-25 LAB — URINALYSIS, ROUTINE W REFLEX MICROSCOPIC
Bilirubin Urine: NEGATIVE
Glucose, UA: NEGATIVE mg/dL
Ketones, ur: 5 mg/dL — AB
Nitrite: NEGATIVE
Protein, ur: 30 mg/dL — AB
Specific Gravity, Urine: 1.015 (ref 1.005–1.030)
pH: 5 (ref 5.0–8.0)

## 2023-10-25 LAB — CBC
HCT: 34 % — ABNORMAL LOW (ref 36.0–46.0)
Hemoglobin: 11.2 g/dL — ABNORMAL LOW (ref 12.0–15.0)
MCH: 27 pg (ref 26.0–34.0)
MCHC: 32.9 g/dL (ref 30.0–36.0)
MCV: 81.9 fL (ref 80.0–100.0)
Platelets: 342 K/uL (ref 150–400)
RBC: 4.15 MIL/uL (ref 3.87–5.11)
RDW: 13.5 % (ref 11.5–15.5)
WBC: 12.9 K/uL — ABNORMAL HIGH (ref 4.0–10.5)
nRBC: 0 % (ref 0.0–0.2)

## 2023-10-25 LAB — COMPREHENSIVE METABOLIC PANEL WITH GFR
ALT: 10 U/L (ref 0–44)
AST: 17 U/L (ref 15–41)
Albumin: 3 g/dL — ABNORMAL LOW (ref 3.5–5.0)
Alkaline Phosphatase: 23 U/L — ABNORMAL LOW (ref 38–126)
Anion gap: 9 (ref 5–15)
BUN: 5 mg/dL — ABNORMAL LOW (ref 6–20)
CO2: 18 mmol/L — ABNORMAL LOW (ref 22–32)
Calcium: 8.9 mg/dL (ref 8.9–10.3)
Chloride: 107 mmol/L (ref 98–111)
Creatinine, Ser: 0.51 mg/dL (ref 0.44–1.00)
GFR, Estimated: >60 mL/min (ref >60)
Glucose, Bld: 124 mg/dL — ABNORMAL HIGH (ref 70–99)
Potassium: 3.5 mmol/L (ref 3.5–5.1)
Sodium: 134 mmol/L — ABNORMAL LOW (ref 135–145)
Total Bilirubin: 0.4 mg/dL (ref 0.0–1.2)
Total Protein: 6.7 g/dL (ref 6.5–8.1)

## 2023-10-25 LAB — LIPASE, BLOOD: Lipase: 53 U/L — ABNORMAL HIGH (ref 11–51)

## 2023-10-25 LAB — HCG, SERUM, QUALITATIVE: Preg, Serum: POSITIVE — AB

## 2023-10-25 MED ORDER — CEPHALEXIN 500 MG PO CAPS
500.0000 mg | ORAL_CAPSULE | Freq: Four times a day (QID) | ORAL | 0 refills | Status: AC
Start: 2023-10-25 — End: 2023-10-30

## 2023-10-25 MED ORDER — ONDANSETRON 4 MG PO TBDP
4.0000 mg | ORAL_TABLET | Freq: Once | ORAL | Status: AC
  Administered 2023-10-25: 4 mg via ORAL
  Filled 2023-10-25: qty 1

## 2023-10-25 NOTE — ED Provider Triage Note (Signed)
 Emergency Medicine Provider Triage Evaluation Note  Tariya Garcon , a 33 y.o. female  was evaluated in triage.  Pt complains of nausea vomiting and malaise.  Review of Systems  Positive: Malaise, mild chills, nausea, vomiting Negative: Abdominal pain, vaginal bleeding, vaginal discharge, constipation, diarrhea, sick contacts  Physical Exam  BP (!) 141/83   Pulse 89   Temp 97.7 F (36.5 C)   Resp 16   Ht 5' 5 (1.651 m)   Wt 100.2 kg   LMP 02/18/2023 (Approximate)   SpO2 99%   BMI 36.78 kg/m  Gen:   Awake, no distress   Resp:  Normal effort  MSK:   Moves extremities without difficulty  Other:  Mildly dry mucous membranes.  No abdominal tenderness.  Good bowel sounds.  Medical Decision Making  Medically screening exam initiated at 8:10 AM.  Appropriate orders placed.  Yamila Sandlin was informed that the remainder of the evaluation will be completed by another provider, this initial triage assessment does not replace that evaluation, and the importance of remaining in the ED until their evaluation is complete.  Antonette Albin is a 33 y.o. female who is 14 weeks 4 days pregnant who presents with nausea and vomiting and malaise.  Patient reports that she was doing well until she had cookout last night and afterwards think she may have had some food poisoning.  She reports she ate meat for the first time and the pregnancy and had a corn dog, puppies, and onion rings.  She says that shortly thereafter she started having nausea that led to vomiting and she has not been taking much oral intake and today.  She denies any abdominal pain or cramping but had some mild malaise and fatigue like she was having something viral.  Otherwise she denies any cough, congestion, runny nose, headache, or neck pain.  Denies any dysuria or urinary changes.  Denies any bowel changes.  Denies any trauma.  Denies any flank or back pain either.  No pain.  She reports that she had taken Zofran  to her proxy earlier  but then did not use it after she did some reading about it.  On my exam, lungs clear.  Chest nontender.  Abdomen nontender.  Patient very well-appearing but has slightly dry mucous membranes.  Vital signs did not reveal tachycardia or tachypnea and she is not febrile.  Patient otherwise well-appearing.  Clinically I suspect she has a viral gastroenteritis that is very early given her chills and malaise and achiness versus a food poisoning or food intolerance after eating a greasy fried cookout meal when she did not had meat for some time.  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  Low suspicion for cholecystitis at this time.  We also discussed her going to the [facility] but patient went for stay in the regular Emergency Department she does not think this is pregnancy related.  This is reasonable.  She had some screening labs drawn initially in triage and we discussed that when her labs return, they will likely try p.o. challenge.  If she is able to drink she may not need IV fluids but if not she may need IV fluids or other medications.  Patient will go to the waiting room and await further evaluation and workup results.    Galvin Aversa, Lonni PARAS, MD 10/25/23 [phone]

## 2023-10-25 NOTE — Discharge Instructions (Signed)
 Thank you for allowing us  to care for you today.  Please start taking Keflex  for bacteria in your urine.  Please be sure to follow-up with your OB/GYN and primary care provider.  Please return to the emergency department if you experience further episodes of vomiting with inability to tolerate anything by mouth, abdominal pain, chest pain, shortness of breath, passout or believe you need emergent medical care

## 2023-10-25 NOTE — ED Triage Notes (Signed)
 Pt arrived via POV c/o 0500 pt started vomiting yellow. Pt states she is [redacted] weeks pregnant. Pt states she ate cookout last night.

## 2023-10-25 NOTE — ED Provider Notes (Signed)
 [facility] Provider Note   CSN: [id number] Arrival date & time: 10/25/23  0710     Patient presents with: Emesis   Monique Forbes is a G7 P1-0-5-1 33 y.o. female with past medical history significant for type 2 diabetes, hypertension, PCOS, history of seizures who presents emergency department for nausea and vomiting.  Patient states that this morning she awoke and had 2 episodes of nonbloody and nonbilious emesis associated with nausea.  Patient states that she ate cookout last night and is concerned that she could have food poisoning.  Patient states that prior to today she has been tolerating p.o. well without need for antiemetics.  Patient currently denies abdominal pain however does endorse nausea and denies any recurrent episodes of emesis.  Patient denies associated fever, chest pain, shortness of breath, syncope.  She denies vaginal pain, vaginal bleeding or discharge.  She states she has an upcoming OB/GYN appointment.    Emesis      Prior to Admission medications   Medication Sig Start Date End Date Taking? Authorizing Provider  cephALEXin  (KEFLEX ) 500 MG capsule Take 1 capsule (500 mg total) by mouth 4 (four) times daily for 5 days. 10/25/23 10/30/23 Yes Nada Chroman, DO  albuterol  (VENTOLIN  HFA) 108 (90 Base) MCG/ACT inhaler Inhale 1-2 puffs into the lungs every 6 (six) hours as needed for wheezing or shortness of breath. 04/03/23   Suellen Cantor A, PA-C  aspirin  EC 81 MG tablet Take 2 tablets (162 mg total) by mouth daily. Swallow whole. Patient not taking: Reported on 10/07/2023 09/30/23   Kizzie Suzen SAUNDERS, CNM  Continuous Glucose Sensor (FREESTYLE LIBRE 3 SENSOR) MISC 1 each by Does not apply route daily at 6 (six) AM. Place 1 sensor on the skin every 14 days. Use to check glucose continuously. Please give 30 day supply Patient not taking: Reported on 10/07/2023 10/01/23   Rasch, Delon I, NP  labetalol  (NORMODYNE ) 100 MG  tablet Take 1 tablet (100 mg total) by mouth 2 (two) times daily. 08/31/23   Dean Clarity, MD  metFORMIN  (GLUCOPHAGE -XR) 750 MG 24 hr tablet Take 750 mg by mouth daily with breakfast. 10/30/22 10/30/23  [provider]  omeprazole  (PRILOSEC) 40 MG capsule Take 1 capsule (40 mg total) by mouth 2 (two) times daily. 07/10/22   Liana Fish, NP  ondansetron  (ZOFRAN -ODT) 4 MG disintegrating tablet Take 1 tablet (4 mg total) by mouth every 8 (eight) hours as needed. 08/31/23   Dean Clarity, MD  Prenatal Vit-Fe Fumarate-FA (PRENATAL VITAMIN) 27-0.8 MG TABS Take 1 tablet by mouth daily. 08/31/23   Dean Clarity, MD    Allergies: Bee venom, Cashew nut oil, Flavoring agent (non-screening), Cashew nut (anacardium occidentale) skin test, and Trazodone    Review of Systems  Gastrointestinal:  Positive for vomiting.    Updated Vital Signs BP 110/61   Pulse 82   Temp 97.7 F (36.5 C)   Resp 18   Ht 5' 5 (1.651 m)   Wt 100.2 kg   LMP 02/18/2023 (Approximate)   SpO2 100%   BMI 36.78 kg/m   Physical Exam Vitals reviewed.  Constitutional:      General: She is not in acute distress.    Appearance: She is not ill-appearing.  Cardiovascular:     Rate and Rhythm: Normal rate and regular rhythm.     Pulses:          Radial pulses are 2+ on the right side and 2+ on the left  side.     Heart sounds: Normal heart sounds. No murmur heard. Pulmonary:     Effort: Pulmonary effort is normal. No tachypnea.  Abdominal:     General: There is no distension.     Palpations: Abdomen is soft.     Tenderness: There is no abdominal tenderness. There is no right CVA tenderness or left CVA tenderness.     Comments: Uterine fundus palpated between pubic symphysis and umbilicus consistent with 14-week gestation  Musculoskeletal:     Right lower leg: No edema.     Left lower leg: No edema.     Comments: Spontaneous movement of bilateral upper and lower extremities  Skin:    Findings: No rash.   Neurological:     Mental Status: She is alert.  Psychiatric:        Behavior: Behavior is cooperative.     (all labs ordered are listed, but only abnormal results are displayed) Labs Reviewed  LIPASE, BLOOD - Abnormal; Notable for the following components:      Result Value   Lipase 53 (*)    All other components within normal limits  COMPREHENSIVE METABOLIC PANEL WITH GFR - Abnormal; Notable for the following components:   Sodium 134 (*)    CO2 18 (*)    Glucose, Bld 124 (*)    BUN 5 (*)    Albumin 3.0 (*)    Alkaline Phosphatase 23 (*)    All other components within normal limits  CBC - Abnormal; Notable for the following components:   WBC 12.9 (*)    Hemoglobin 11.2 (*)    HCT 34.0 (*)    All other components within normal limits  URINALYSIS, ROUTINE W REFLEX MICROSCOPIC - Abnormal; Notable for the following components:   APPearance CLOUDY (*)    Hgb urine dipstick SMALL (*)    Ketones, ur 5 (*)    Protein, ur 30 (*)    Leukocytes,Ua SMALL (*)    Bacteria, UA MANY (*)    All other components within normal limits  HCG, SERUM, QUALITATIVE - Abnormal; Notable for the following components:   Preg, Serum POSITIVE (*)    All other components within normal limits    EKG: None  Radiology: No results found.   Procedures   Medications Ordered in the ED  ondansetron  (ZOFRAN -ODT) disintegrating tablet 4 mg (4 mg Oral Given 10/25/23 0841)    Clinical Course as of 10/25/23 0949  Fri Oct 25, 2023  0920 Evaluated.  Reassuring vitals and labs.  Benign abdomen.  Feeling much improved after Zofran .  Plan for p.o. challenge and likely discharge with OB/GYN follow-up.  Does have some bacteria in her urine, will need antibiotics [TY]    Clinical Course User Index [TY] Neysa Caron PARAS, DO                                 Medical Decision Making Amount and/or Complexity of Data Reviewed Labs: ordered.  Risk Prescription drug management.   On initial evaluation patient  is hemodynamically stable, afebrile and not in acute distress.  Based on patient's history differential diagnosis includes gastroenteritis, gastritis, pancreatitis, acute viral illness, hyperemesis gravidarum.  Will obtain laboratory studies to assess for dehydration or electrolyte abnormalities as well as lipase.  Laboratory studies without significant leukocytosis, anemia, severe electrolyte abnormalities, renal impairment.  Lipase within normal limits.  Urinalysis with evidence of asymptomatic bacteriuria.  Will give Keflex   at discharge for asymptomatic bacteriuria in pregnancy and recommend OB/GYN follow-up.  Patient's presentation less likely pancreatitis as lipase within normal limits, less likely gastroenteritis as patient does not have associated diarrhea and less likely hyperemesis gravidarum as patient has had no recurrent episodes of nausea or vomiting.  Less likely abdominal pathology as patient denies any abdominal pain at this time.  Will give patient Zofran .  Upon reevaluation patient states her symptoms have significantly improved with one-time dose of Zofran  as she is tolerating p.o.  At this time believe patient is safe to be discharged home with strict return precautions, PCP and OB/GYN follow-up.  Patient was agreeable with plan and had no further questions upon discharge     Final diagnoses:  Nausea and vomiting, unspecified vomiting type    ED Discharge Orders          Ordered    cephALEXin  (KEFLEX ) 500 MG capsule  4 times daily        10/25/23 0924            Lavanda Bolster DO Emergency Medicine PGY2    Bolster Lavanda, DO 10/25/23 9050    Neysa Caron PARAS, DO 10/25/23 [phone]

## 2023-11-01 ENCOUNTER — Other Ambulatory Visit

## 2023-11-01 ENCOUNTER — Ambulatory Visit (INDEPENDENT_AMBULATORY_CARE_PROVIDER_SITE_OTHER): Admitting: Obstetrics & Gynecology

## 2023-11-01 VITALS — BP 127/84 | HR 82 | Wt 224.0 lb

## 2023-11-01 DIAGNOSIS — O0990 Supervision of high risk pregnancy, unspecified, unspecified trimester: Secondary | ICD-10-CM

## 2023-11-01 DIAGNOSIS — Z3A15 15 weeks gestation of pregnancy: Secondary | ICD-10-CM

## 2023-11-01 DIAGNOSIS — O0992 Supervision of high risk pregnancy, unspecified, second trimester: Secondary | ICD-10-CM

## 2023-11-01 DIAGNOSIS — Z87440 Personal history of urinary (tract) infections: Secondary | ICD-10-CM | POA: Diagnosis not present

## 2023-11-01 DIAGNOSIS — E119 Type 2 diabetes mellitus without complications: Secondary | ICD-10-CM | POA: Diagnosis not present

## 2023-11-01 MED ORDER — DEXCOM G7 SENSOR MISC: 1.0000 | Freq: Every day | 3 refills | Status: AC

## 2023-11-01 NOTE — Progress Notes (Signed)
 HIGH-RISK PREGNANCY VISIT Patient name: Monique Forbes MRN [id number]  Date of birth: [birth year] Chief Complaint:   Routine Prenatal Visit  History of Present Illness:   Monique Forbes is a 33 y.o. G10P1051 female at [redacted]w[redacted]d with an Estimated Date of Delivery: 04/20/24 being seen today for ongoing management of a high-risk pregnancy complicated by     ICD-10-CM   1. Supervision of high risk pregnancy, antepartum  O09.90 AFP, Serum, Open Spina Bifida    2. [redacted] weeks gestation of pregnancy  Z3A.15 AFP, Serum, Open Spina Bifida    Continuous Glucose Sensor (DEXCOM G7 SENSOR) MISC    3. History of UTI  Z87.440 Urine Culture    4. Type 2 diabetes mellitus without complication, without long-term current use of insulin (HCC)  E11.9 Continuous Glucose Sensor (DEXCOM G7 SENSOR) MISC     .    Today she reports no complaints. Contractions: Not present. Vag. Bleeding: None.   . denies leaking of fluid.      09/30/2023    9:26 AM 03/20/2021   10:24 AM 01/10/2021   10:46 AM 10/24/2020   10:14 AM 05/30/2020   11:33 AM  Depression screen PHQ 2/9  Decreased Interest 0 0 2 0 0  Down, Depressed, Hopeless 1 0 2 0 0  PHQ - 2 Score 1 0 4 0 0  Altered sleeping 1  2    Tired, decreased energy 1  3    Change in appetite 1  0    Feeling bad or failure about yourself  1  1    Trouble concentrating 0  0    Moving slowly or fidgety/restless 0  0    Suicidal thoughts 0  0    PHQ-9 Score 5  10    Difficult doing work/chores   Somewhat difficult          09/30/2023    9:27 AM  GAD 7 : Generalized Anxiety Score  Nervous, Anxious, on Edge 0  Control/stop worrying 0  Worry too much - different things 0  Trouble relaxing 0  Restless 0  Easily annoyed or irritable 0  Afraid - awful might happen 0  Total GAD 7 Score 0     Review of Systems:   Pertinent items are noted in HPI Denies abnormal vaginal discharge w/ itching/odor/irritation, headaches, visual changes, shortness of breath, chest pain,  abdominal pain, severe nausea/vomiting, or problems with urination or bowel movements unless otherwise stated above. Pertinent History Reviewed:  Reviewed past medical,surgical, social, obstetrical and family history.  Reviewed problem list, medications and allergies. Physical Assessment:   Vitals:   11/01/23 1011 11/01/23 1020  BP: (!) 142/85 127/84  Pulse: 82   Weight: 224 lb (101.6 kg)   Body mass index is 37.28 kg/m.           Physical Examination:   General appearance: alert, well appearing, and in no distress  Mental status: alert, oriented to person, place, and time  Skin: warm & dry   Extremities:      Cardiovascular: normal heart rate noted  Respiratory: normal respiratory effort, no distress  Abdomen: gravid, soft, non-tender  Pelvic: Cervical exam deferred         Fetal Status:          Fetal Surveillance Testing today:    Chaperone:     No results found for this or any previous visit (from the past 24 hours).  Assessment & Plan:  High-risk pregnancy: H2E8948 at [redacted]w[redacted]d  with an Estimated Date of Delivery: 04/20/24      ICD-10-CM   1. Supervision of high risk pregnancy, antepartum  O09.90 AFP, Serum, Open Spina Bifida    2. [redacted] weeks gestation of pregnancy  Z3A.15 AFP, Serum, Open Spina Bifida    Continuous Glucose Sensor (DEXCOM G7 SENSOR) MISC    3. History of UTI  Z87.440 Urine Culture    4. Type 2 diabetes mellitus without complication, without long-term current use of insulin (HCC)  E11.9 Continuous Glucose Sensor (DEXCOM G7 SENSOR) MISC         Meds:  Meds ordered this encounter  Medications   Continuous Glucose Sensor (DEXCOM G7 SENSOR) MISC    Sig: 1 each by Does not apply route daily. Change sensor every 10 days    Dispense:  3 each    Refill:  3    Please give 30 days supply    Orders:  Orders Placed This Encounter  Procedures   Urine Culture   AFP, Serum, Open Spina Bifida     Labs/procedures today:   Treatment Plan:  scan 4  weeks    Follow-up: Return in about 2 weeks (around 11/15/2023) for with Dr Jayne.   Future Appointments  Date Time Provider Department Center  12/02/2023 10:00 AM [facility] US  [facility] None  12/02/2023 10:50 AM Dorian Renfro, Vonn DEL, MD [facility]    Orders Placed This Encounter  Procedures   Urine Culture   AFP, Serum, Open Spina Bifida   Vonn DEL Jayne  Attending Physician for the Center for [facility] 11/17/2023 10:31 PM

## 2023-11-03 LAB — AFP, SERUM, OPEN SPINA BIFIDA
AFP MoM: 1.01
AFP Value: 23 ng/mL
Gest. Age on Collection Date: 15 wk
Maternal Age At EDD: 35.5 a
OSBR Risk 1 IN: 10000
Test Results:: NEGATIVE
Weight: 224 [lb_av]

## 2023-11-03 LAB — URINE CULTURE

## 2023-11-05 ENCOUNTER — Telehealth: Payer: Self-pay | Admitting: *Deleted

## 2023-11-05 NOTE — Telephone Encounter (Signed)
 Angelic with [employer] called for peer to peer for Dexcom.  Dexcom approved. Can reach her if any additional questions. [phone]

## 2023-11-06 ENCOUNTER — Encounter: Payer: Self-pay | Admitting: *Deleted

## 2023-11-15 ENCOUNTER — Encounter: Admitting: Obstetrics & Gynecology

## 2023-11-29 ENCOUNTER — Other Ambulatory Visit: Payer: Self-pay | Admitting: Obstetrics & Gynecology

## 2023-11-29 DIAGNOSIS — Z363 Encounter for antenatal screening for malformations: Secondary | ICD-10-CM

## 2023-12-02 ENCOUNTER — Encounter: Payer: Self-pay | Admitting: Obstetrics & Gynecology

## 2023-12-02 ENCOUNTER — Ambulatory Visit: Admitting: Obstetrics & Gynecology

## 2023-12-02 ENCOUNTER — Ambulatory Visit (INDEPENDENT_AMBULATORY_CARE_PROVIDER_SITE_OTHER)

## 2023-12-02 VITALS — BP 132/84 | HR 90 | Wt 224.0 lb

## 2023-12-02 DIAGNOSIS — O10913 Unspecified pre-existing hypertension complicating pregnancy, third trimester: Secondary | ICD-10-CM | POA: Diagnosis not present

## 2023-12-02 DIAGNOSIS — Z363 Encounter for antenatal screening for malformations: Secondary | ICD-10-CM

## 2023-12-02 DIAGNOSIS — O24312 Unspecified pre-existing diabetes mellitus in pregnancy, second trimester: Secondary | ICD-10-CM | POA: Diagnosis not present

## 2023-12-02 DIAGNOSIS — O0992 Supervision of high risk pregnancy, unspecified, second trimester: Secondary | ICD-10-CM | POA: Diagnosis not present

## 2023-12-02 DIAGNOSIS — O10912 Unspecified pre-existing hypertension complicating pregnancy, second trimester: Secondary | ICD-10-CM

## 2023-12-02 DIAGNOSIS — O10919 Unspecified pre-existing hypertension complicating pregnancy, unspecified trimester: Secondary | ICD-10-CM

## 2023-12-02 DIAGNOSIS — O0990 Supervision of high risk pregnancy, unspecified, unspecified trimester: Secondary | ICD-10-CM

## 2023-12-02 DIAGNOSIS — O24319 Unspecified pre-existing diabetes mellitus in pregnancy, unspecified trimester: Secondary | ICD-10-CM | POA: Insufficient documentation

## 2023-12-02 DIAGNOSIS — O09522 Supervision of elderly multigravida, second trimester: Secondary | ICD-10-CM | POA: Diagnosis not present

## 2023-12-02 DIAGNOSIS — O24112 Pre-existing diabetes mellitus, type 2, in pregnancy, second trimester: Secondary | ICD-10-CM | POA: Diagnosis not present

## 2023-12-02 DIAGNOSIS — G40909 Epilepsy, unspecified, not intractable, without status epilepticus: Secondary | ICD-10-CM | POA: Diagnosis not present

## 2023-12-02 DIAGNOSIS — Z3A20 20 weeks gestation of pregnancy: Secondary | ICD-10-CM

## 2023-12-02 MED ORDER — LEVETIRACETAM 500 MG PO TABS: 500.0000 mg | ORAL_TABLET | Freq: Two times a day (BID) | ORAL | 2 refills | Status: AC

## 2023-12-02 MED ORDER — METFORMIN HCL 500 MG PO TABS: ORAL_TABLET | ORAL | 11 refills | Status: DC

## 2023-12-02 NOTE — Progress Notes (Signed)
 HIGH-RISK PREGNANCY VISIT Patient name: Monique Forbes MRN [id number]  Date of birth: 1990-11-24 Chief Complaint:   Routine Prenatal Visit  History of Present Illness:   Monique Forbes is a 33 y.o. G11P1051 female at [redacted]w[redacted]d with an Estimated Date of Delivery: 04/20/24 being seen today for ongoing management of a high-risk pregnancy complicated by     ICD-10-CM   1. Supervision of high risk pregnancy, antepartum  O09.90     2. Class B DM: metformin  500 qAM and 1000 mg qhs  O24.319     3. Chronic hypertension affecting pregnancy: Labetalol  100 mg BID  O10.919     4. Seizure disorder: will start keppra  500 mg BID  G40.909      .    Today she reports no complaints.  . Vag. Bleeding: None.  Movement: Present. denies leaking of fluid.      09/30/2023    9:26 AM 03/20/2021   10:24 AM 01/10/2021   10:46 AM 10/24/2020   10:14 AM 05/30/2020   11:33 AM  Depression screen PHQ 2/9  Decreased Interest 0 0 2 0 0  Down, Depressed, Hopeless 1 0 2 0 0  PHQ - 2 Score 1 0 4 0 0  Altered sleeping 1  2    Tired, decreased energy 1  3    Change in appetite 1  0    Feeling bad or failure about yourself  1  1    Trouble concentrating 0  0    Moving slowly or fidgety/restless 0  0    Suicidal thoughts 0  0    PHQ-9 Score 5  10    Difficult doing work/chores   Somewhat difficult          09/30/2023    9:27 AM  GAD 7 : Generalized Anxiety Score  Nervous, Anxious, on Edge 0  Control/stop worrying 0  Worry too much - different things 0  Trouble relaxing 0  Restless 0  Easily annoyed or irritable 0  Afraid - awful might happen 0  Total GAD 7 Score 0     Review of Systems:   Pertinent items are noted in HPI Denies abnormal vaginal discharge w/ itching/odor/irritation, headaches, visual changes, shortness of breath, chest pain, abdominal pain, severe nausea/vomiting, or problems with urination or bowel movements unless otherwise stated above. Pertinent History Reviewed:  Reviewed past  medical,surgical, social, obstetrical and family history.  Reviewed problem list, medications and allergies. Physical Assessment:   Vitals:   12/02/23 1050  BP: 132/84  Pulse: 90  Weight: 224 lb (101.6 kg)  Body mass index is 37.28 kg/m.           Physical Examination:   General appearance: alert, well appearing, and in no distress  Mental status: alert, oriented to person, place, and time  Skin: warm & dry   Extremities:      Cardiovascular: normal heart rate noted  Respiratory: normal respiratory effort, no distress  Abdomen: gravid, soft, non-tender  Pelvic: Cervical exam deferred         Fetal Status:     Movement: Present    Fetal Surveillance Testing today: sonogram   Chaperone:     No results found for this or any previous visit (from the past 24 hours).  Assessment & Plan:  High-risk pregnancy: H2E8948 at [redacted]w[redacted]d with an Estimated Date of Delivery: 04/20/24      ICD-10-CM   1. Supervision of high risk pregnancy, antepartum  O09.90  2. Class B DM: metformin  500 qAM and 1000 mg qhs  O24.319     3. Chronic hypertension affecting pregnancy: Labetalol  100 mg BID  O10.919     4. Seizure disorder: will start keppra  500 mg BID  G40.909          Meds:  Meds ordered this encounter  Medications   metFORMIN  (GLUCOPHAGE ) 500 MG tablet    Sig: 1 tablet in am and 2 at bedtime    Dispense:  90 tablet    Refill:  11   levETIRAcetam  (KEPPRA ) 500 MG tablet    Sig: Take 1 tablet (500 mg total) by mouth 2 (two) times daily.    Dispense:  60 tablet    Refill:  2    Orders: No orders of the defined types were placed in this encounter.    Labs/procedures today: U/S  Treatment Plan:  growth scan 4 weeks    Follow-up: Return in about 4 weeks (around 12/30/2023) for sonogram + OB visit.   No future appointments.  No orders of the defined types were placed in this encounter.  Vonn VEAR Inch  Attending Physician for the Center for [facility] 12/02/2023 11:30 AM

## 2023-12-02 NOTE — Progress Notes (Signed)
 US  20 wks,cephalic/breech,posterior placenta gr 0,SVP of fluid 5 cm,FHR 150 bpm,CX 4.9 cm,normal ovaries,EFW 310 g 32%,anatomy complete,no obvious abnormalities

## 2023-12-27 ENCOUNTER — Other Ambulatory Visit: Payer: Self-pay | Admitting: Obstetrics & Gynecology

## 2023-12-27 DIAGNOSIS — O10919 Unspecified pre-existing hypertension complicating pregnancy, unspecified trimester: Secondary | ICD-10-CM

## 2023-12-27 DIAGNOSIS — O24419 Gestational diabetes mellitus in pregnancy, unspecified control: Secondary | ICD-10-CM

## 2023-12-30 ENCOUNTER — Encounter: Admitting: Women's Health

## 2023-12-30 ENCOUNTER — Other Ambulatory Visit

## 2024-01-10 ENCOUNTER — Encounter: Payer: Self-pay | Admitting: Obstetrics & Gynecology

## 2024-01-13 ENCOUNTER — Ambulatory Visit (INDEPENDENT_AMBULATORY_CARE_PROVIDER_SITE_OTHER): Admitting: Obstetrics & Gynecology

## 2024-01-13 ENCOUNTER — Ambulatory Visit (INDEPENDENT_AMBULATORY_CARE_PROVIDER_SITE_OTHER): Admitting: Radiology

## 2024-01-13 ENCOUNTER — Encounter: Payer: Self-pay | Admitting: Obstetrics & Gynecology

## 2024-01-13 VITALS — BP 121/82 | HR 96 | Wt 226.6 lb

## 2024-01-13 DIAGNOSIS — B009 Herpesviral infection, unspecified: Secondary | ICD-10-CM

## 2024-01-13 DIAGNOSIS — O24419 Gestational diabetes mellitus in pregnancy, unspecified control: Secondary | ICD-10-CM

## 2024-01-13 DIAGNOSIS — Z23 Encounter for immunization: Secondary | ICD-10-CM | POA: Diagnosis not present

## 2024-01-13 DIAGNOSIS — O09892 Supervision of other high risk pregnancies, second trimester: Secondary | ICD-10-CM | POA: Diagnosis not present

## 2024-01-13 DIAGNOSIS — O10012 Pre-existing essential hypertension complicating pregnancy, second trimester: Secondary | ICD-10-CM | POA: Diagnosis not present

## 2024-01-13 DIAGNOSIS — O10919 Unspecified pre-existing hypertension complicating pregnancy, unspecified trimester: Secondary | ICD-10-CM

## 2024-01-13 DIAGNOSIS — O24112 Pre-existing diabetes mellitus, type 2, in pregnancy, second trimester: Secondary | ICD-10-CM

## 2024-01-13 DIAGNOSIS — O98512 Other viral diseases complicating pregnancy, second trimester: Secondary | ICD-10-CM

## 2024-01-13 DIAGNOSIS — O0990 Supervision of high risk pregnancy, unspecified, unspecified trimester: Secondary | ICD-10-CM

## 2024-01-13 DIAGNOSIS — O09522 Supervision of elderly multigravida, second trimester: Secondary | ICD-10-CM

## 2024-01-13 DIAGNOSIS — O24319 Unspecified pre-existing diabetes mellitus in pregnancy, unspecified trimester: Secondary | ICD-10-CM

## 2024-01-13 DIAGNOSIS — O10912 Unspecified pre-existing hypertension complicating pregnancy, second trimester: Secondary | ICD-10-CM

## 2024-01-13 DIAGNOSIS — Z7984 Long term (current) use of oral hypoglycemic drugs: Secondary | ICD-10-CM

## 2024-01-13 DIAGNOSIS — O24414 Gestational diabetes mellitus in pregnancy, insulin controlled: Secondary | ICD-10-CM | POA: Diagnosis not present

## 2024-01-13 DIAGNOSIS — Z8669 Personal history of other diseases of the nervous system and sense organs: Secondary | ICD-10-CM

## 2024-01-13 DIAGNOSIS — Z3A26 26 weeks gestation of pregnancy: Secondary | ICD-10-CM

## 2024-01-13 NOTE — Progress Notes (Signed)
 US : GA = 26 weeks Single active female fetus, cephalic, FHR = 144 bpm, fundal posterior Rt Lat pl, gr2, SVP = 5.6 cm,  EFW 49%, 911g, nl ov's, CL = 4.8 cm, closed

## 2024-01-13 NOTE — Progress Notes (Signed)
 HIGH-RISK PREGNANCY VISIT Patient name: Monique Forbes MRN [id number]  Date of birth: 1990/08/22 Chief Complaint:   Routine Prenatal Visit  History of Present Illness:   Monique Forbes is a 33 y.o. G60P1051 female at [redacted]w[redacted]d with an Estimated Date of Delivery: 04/20/24 being seen today for ongoing management of a high-risk pregnancy complicated by:  1) Chronic HTN - Taking labetalol  100 mg twice daily - Occasional headaches, denies headache today  2) T2DM- on metformin  - Has Dexcom though currently off and did not bring log of her sugars - Dexcom app states 98% within appropriate range   3) HSV 4) h/o seizure d/o  Today she reports no complaints.   Contractions: Not present. Vag. Bleeding: None.  Movement: Present. denies leaking of fluid.      09/30/2023    9:26 AM 03/20/2021   10:24 AM 01/10/2021   10:46 AM 10/24/2020   10:14 AM 05/30/2020   11:33 AM  Depression screen PHQ 2/9  Decreased Interest 0 0 2 0 0  Down, Depressed, Hopeless 1 0 2 0 0  PHQ - 2 Score 1 0 4 0 0  Altered sleeping 1  2    Tired, decreased energy 1  3    Change in appetite 1  0    Feeling bad or failure about yourself  1  1    Trouble concentrating 0  0    Moving slowly or fidgety/restless 0  0    Suicidal thoughts 0  0    PHQ-9 Score 5  10    Difficult doing work/chores   Somewhat difficult       Current Outpatient Medications  Medication Instructions   albuterol  (VENTOLIN  HFA) 108 (90 Base) MCG/ACT inhaler 1-2 puffs, Inhalation, Every 6 hours PRN   aspirin  EC 162 mg, Oral, Daily, Swallow whole.   Continuous Glucose Sensor (DEXCOM G7 SENSOR) MISC 1 each, Does not apply, Daily, Change sensor every 10 days   labetalol  (NORMODYNE ) 100 mg, Oral, 2 times daily   levETIRAcetam  (KEPPRA ) 500 mg, Oral, 2 times daily   metFORMIN  (GLUCOPHAGE ) 500 MG tablet 1 tablet in am and 2 at bedtime   omeprazole  (PRILOSEC) 40 mg, Oral, 2 times daily   ondansetron  (ZOFRAN -ODT) 4 mg, Oral, Every 8 hours PRN   Prenatal  Vit-Fe Fumarate-FA (PRENATAL VITAMIN) 27-0.8 MG TABS 1 tablet, Oral, Daily     Review of Systems:   Pertinent items are noted in HPI Denies abnormal vaginal discharge w/ itching/odor/irritation, visual changes, shortness of breath, chest pain, abdominal pain, severe nausea/vomiting, or problems with urination or bowel movements unless otherwise stated above. Pertinent History Reviewed:  Reviewed past medical,surgical, social, obstetrical and family history.  Reviewed problem list, medications and allergies. Physical Assessment:   Vitals:   01/13/24 0935 01/13/24 0938  BP: (!) 141/85 121/82  Pulse: 96 96  Weight: 226 lb 9.6 oz (102.8 kg)   Body mass index is 37.71 kg/m.           Physical Examination:   General appearance: alert, well appearing, and in no distress  Mental status: normal mood, behavior, speech, dress, motor activity, and thought processes  Skin: warm & dry   Extremities: Edema: None    Cardiovascular: normal heart rate noted  Respiratory: normal respiratory effort, no distress  Abdomen: gravid, soft, non-tender  Pelvic: Cervical exam deferred         Fetal Status: Fetal Heart Rate (bpm): 145 Fundal Height: 26 cm Movement: Present    Fetal Surveillance  Testing today: cephalic, FHR = 144 bpm, fundal posterior Rt Lat pl, gr2, SVP = 5.6 cm, EFW 49%, 911g, nl ov's, CL = 4.8 cm, closed    Chaperone: N/A    No results found for this or any previous visit (from the past 24 hours).   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   Labs/procedures today: normal growth scan today, CBC, RPR to be collected, flu shot  Treatment Plan:  routine OB care and  as outlined above  Reviewed: Preterm labor symptoms and general obstetric precautions including but not limited to vaginal bleeding, contractions, leaking of fluid and fetal movement were reviewed in detail with the patient.  All questions were answered. Pt has home bp cuff. Check bp weekly, let us  know if >140/90.   Follow-up: Return in about 4 weeks (around 02/10/2024) for HROB visit, start NST/BPP twice weekly in 6 wk, growth every 4wks.   Future Appointments  Date Time Provider Department Center  02/10/2024 10:45 AM [facility] - [facility] 2 [facility] None  02/10/2024 11:30 AM Adylee Leonardo, DO [facility]    Orders Placed This Encounter  Procedures   US  Fetal BPP W/O Non Stress   US  OB Follow Up   Flu vaccine trivalent PF, 6mos and older(Flulaval,Afluria,Fluarix,Fluzone)   CBC   HIV Antibody (routine testing w rflx)   RPR   Antibody screen    Alejandra Barna, DO Attending Obstetrician & Gynecologist, Faculty Practice Center for [employer], [facility]

## 2024-01-14 ENCOUNTER — Ambulatory Visit: Payer: Self-pay | Admitting: Obstetrics & Gynecology

## 2024-01-14 DIAGNOSIS — D508 Other iron deficiency anemias: Secondary | ICD-10-CM

## 2024-01-14 LAB — CBC
Hematocrit: 33 % — ABNORMAL LOW (ref 34.0–46.6)
Hemoglobin: 10.3 g/dL — ABNORMAL LOW (ref 11.1–15.9)
MCH: 26.1 pg — ABNORMAL LOW (ref 26.6–33.0)
MCHC: 31.2 g/dL — ABNORMAL LOW (ref 31.5–35.7)
MCV: 84 fL (ref 79–97)
Platelets: 324 x10E3/uL (ref 150–450)
RBC: 3.94 x10E6/uL (ref 3.77–5.28)
RDW: 13.9 % (ref 11.7–15.4)
WBC: 12.5 x10E3/uL — ABNORMAL HIGH (ref 3.4–10.8)

## 2024-01-14 LAB — RPR: RPR Ser Ql: NONREACTIVE

## 2024-01-14 LAB — HIV ANTIBODY (ROUTINE TESTING W REFLEX): HIV Screen 4th Generation wRfx: NONREACTIVE

## 2024-01-14 LAB — ANTIBODY SCREEN: Antibody Screen: NEGATIVE

## 2024-01-14 MED ORDER — ACCRUFER 30 MG PO CAPS: 1.0000 | ORAL_CAPSULE | Freq: Every day | ORAL | 11 refills | Status: AC

## 2024-01-17 ENCOUNTER — Other Ambulatory Visit: Payer: Self-pay

## 2024-01-17 ENCOUNTER — Ambulatory Visit
Admission: EM | Admit: 2024-01-17 | Discharge: 2024-01-17 | Disposition: A | Discharge status: Home / Self Care | Attending: Nurse Practitioner | Admitting: Nurse Practitioner

## 2024-01-17 ENCOUNTER — Encounter: Payer: Self-pay | Admitting: Emergency Medicine

## 2024-01-17 DIAGNOSIS — L739 Follicular disorder, unspecified: Secondary | ICD-10-CM

## 2024-01-17 MED ORDER — MUPIROCIN 2 % EX OINT: 1.0000 | TOPICAL_OINTMENT | Freq: Two times a day (BID) | CUTANEOUS | 0 refills | Status: DC

## 2024-01-17 MED ORDER — CHLORHEXIDINE GLUCONATE 4 % EX SOLN: CUTANEOUS | 0 refills | Status: DC

## 2024-01-17 NOTE — Discharge Instructions (Signed)
 Apply medication as prescribed. You may take over-the-counter Tylenol as needed for pain or discomfort. Apply cool compresses to the affected area to help with pain or swelling, apply warm compresses to help with pain or discomfort. Keep the area clean and dry. Continue to monitor the area for worsening.  Seek care if you develop increase redness, swelling, drainage, or oozing from the site. Follow-up as needed.

## 2024-01-17 NOTE — ED Triage Notes (Signed)
 Pt reports possible bite to top of head with pain radiating down left side of head x2 days. Denies any known injury.
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
## 2024-02-04 ENCOUNTER — Other Ambulatory Visit: Payer: Self-pay | Admitting: Obstetrics & Gynecology

## 2024-02-04 DIAGNOSIS — O0990 Supervision of high risk pregnancy, unspecified, unspecified trimester: Secondary | ICD-10-CM

## 2024-02-04 DIAGNOSIS — O10919 Unspecified pre-existing hypertension complicating pregnancy, unspecified trimester: Secondary | ICD-10-CM

## 2024-02-04 DIAGNOSIS — O24319 Unspecified pre-existing diabetes mellitus in pregnancy, unspecified trimester: Secondary | ICD-10-CM

## 2024-02-04 DIAGNOSIS — Z23 Encounter for immunization: Secondary | ICD-10-CM

## 2024-02-04 DIAGNOSIS — Z3A26 26 weeks gestation of pregnancy: Secondary | ICD-10-CM

## 2024-02-04 DIAGNOSIS — O09523 Supervision of elderly multigravida, third trimester: Secondary | ICD-10-CM

## 2024-02-05 ENCOUNTER — Encounter (HOSPITAL_COMMUNITY): Payer: Self-pay | Admitting: Obstetrics and Gynecology

## 2024-02-05 ENCOUNTER — Inpatient Hospital Stay (HOSPITAL_COMMUNITY)
Admission: AD | Admit: 2024-02-05 | Discharge: 2024-02-05 | Disposition: A | Discharge status: Home / Self Care | Attending: Obstetrics and Gynecology | Admitting: Obstetrics and Gynecology

## 2024-02-05 DIAGNOSIS — O10013 Pre-existing essential hypertension complicating pregnancy, third trimester: Secondary | ICD-10-CM | POA: Insufficient documentation

## 2024-02-05 DIAGNOSIS — D509 Iron deficiency anemia, unspecified: Secondary | ICD-10-CM | POA: Diagnosis not present

## 2024-02-05 DIAGNOSIS — E876 Hypokalemia: Secondary | ICD-10-CM | POA: Insufficient documentation

## 2024-02-05 DIAGNOSIS — Z3A29 29 weeks gestation of pregnancy: Secondary | ICD-10-CM | POA: Diagnosis not present

## 2024-02-05 DIAGNOSIS — O99013 Anemia complicating pregnancy, third trimester: Secondary | ICD-10-CM | POA: Insufficient documentation

## 2024-02-05 DIAGNOSIS — O99283 Endocrine, nutritional and metabolic diseases complicating pregnancy, third trimester: Secondary | ICD-10-CM | POA: Insufficient documentation

## 2024-02-05 DIAGNOSIS — Z3689 Encounter for other specified antenatal screening: Secondary | ICD-10-CM

## 2024-02-05 DIAGNOSIS — O10919 Unspecified pre-existing hypertension complicating pregnancy, unspecified trimester: Secondary | ICD-10-CM

## 2024-02-05 LAB — CBC
HCT: 30 % — ABNORMAL LOW (ref 36.0–46.0)
Hemoglobin: 9.8 g/dL — ABNORMAL LOW (ref 12.0–15.0)
MCH: 26.3 pg (ref 26.0–34.0)
MCHC: 32.7 g/dL (ref 30.0–36.0)
MCV: 80.6 fL (ref 80.0–100.0)
Platelets: 297 K/uL (ref 150–400)
RBC: 3.72 MIL/uL — ABNORMAL LOW (ref 3.87–5.11)
RDW: 14.5 % (ref 11.5–15.5)
WBC: 13.2 K/uL — ABNORMAL HIGH (ref 4.0–10.5)
nRBC: 0 % (ref 0.0–0.2)

## 2024-02-05 LAB — COMPREHENSIVE METABOLIC PANEL WITH GFR
ALT: 11 U/L (ref 0–44)
AST: 18 U/L (ref 15–41)
Albumin: 2.5 g/dL — ABNORMAL LOW (ref 3.5–5.0)
Alkaline Phosphatase: 43 U/L (ref 38–126)
Anion gap: 13 (ref 5–15)
BUN: <5 mg/dL — ABNORMAL LOW (ref 6–20)
CO2: 20 mmol/L — ABNORMAL LOW (ref 22–32)
Calcium: 8.5 mg/dL — ABNORMAL LOW (ref 8.9–10.3)
Chloride: 102 mmol/L (ref 98–111)
Creatinine, Ser: 0.57 mg/dL (ref 0.44–1.00)
GFR, Estimated: >60 mL/min (ref >60)
Glucose, Bld: 143 mg/dL — ABNORMAL HIGH (ref 70–99)
Potassium: 3.4 mmol/L — ABNORMAL LOW (ref 3.5–5.1)
Sodium: 135 mmol/L (ref 135–145)
Total Bilirubin: 0.5 mg/dL (ref 0.0–1.2)
Total Protein: 6.1 g/dL — ABNORMAL LOW (ref 6.5–8.1)

## 2024-02-05 LAB — PROTEIN / CREATININE RATIO, URINE
Creatinine, Urine: 148 mg/dL
Protein Creatinine Ratio: 0.09 mg/mg{creat} (ref 0.00–0.15)
Total Protein, Urine: 13 mg/dL

## 2024-02-05 MED ORDER — FERROUS SULFATE 325 (65 FE) MG PO TABS: 325.0000 mg | ORAL_TABLET | Freq: Every day | ORAL | 0 refills | Status: DC

## 2024-02-05 MED ORDER — POTASSIUM CHLORIDE CRYS ER 20 MEQ PO TBCR: 20.0000 meq | EXTENDED_RELEASE_TABLET | Freq: Every day | ORAL | 0 refills | Status: DC

## 2024-02-05 NOTE — MAU Note (Signed)
 Monique Forbes is a 33 y.o. at [redacted]w[redacted]d here in MAU reporting: elevated blood pressures at home that were 160's/90-100's.  Pt also reports having blurry vision and a headache. Took Tylenol and was not effective.  Denies VB or vaginal discharge.  +FM    Onset of complaint: yesterday in the afternoon  Pain score: 5 Vitals:   02/05/24 0924 02/05/24 0926  BP: 121/75 121/75  Pulse:  (!) 101  Resp:  18  Temp:  98.1 F (36.7 C)  SpO2: 99% 99%     FHT: 150 Lab orders placed from triage: none
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
## 2024-02-10 ENCOUNTER — Ambulatory Visit: Admitting: Obstetrics & Gynecology

## 2024-02-10 ENCOUNTER — Encounter: Payer: Self-pay | Admitting: Obstetrics & Gynecology

## 2024-02-10 ENCOUNTER — Ambulatory Visit: Admitting: Radiology

## 2024-02-10 VITALS — BP 129/84 | HR 103 | Wt 229.0 lb

## 2024-02-10 DIAGNOSIS — O0990 Supervision of high risk pregnancy, unspecified, unspecified trimester: Secondary | ICD-10-CM

## 2024-02-10 DIAGNOSIS — O10919 Unspecified pre-existing hypertension complicating pregnancy, unspecified trimester: Secondary | ICD-10-CM

## 2024-02-10 DIAGNOSIS — G40909 Epilepsy, unspecified, not intractable, without status epilepticus: Secondary | ICD-10-CM

## 2024-02-10 DIAGNOSIS — O24319 Unspecified pre-existing diabetes mellitus in pregnancy, unspecified trimester: Secondary | ICD-10-CM

## 2024-02-10 DIAGNOSIS — Z3A30 30 weeks gestation of pregnancy: Secondary | ICD-10-CM

## 2024-02-10 DIAGNOSIS — E1165 Type 2 diabetes mellitus with hyperglycemia: Secondary | ICD-10-CM

## 2024-02-10 DIAGNOSIS — O09523 Supervision of elderly multigravida, third trimester: Secondary | ICD-10-CM

## 2024-02-10 MED ORDER — METFORMIN HCL 500 MG PO TABS: 1000.0000 mg | ORAL_TABLET | Freq: Two times a day (BID) | ORAL | 3 refills | Status: DC

## 2024-02-10 NOTE — Progress Notes (Signed)
 US : GA = 30 weeks Single active female fetus, Breech, FHR = 155 bpm, fundal posterior pl, gr2, AFI = 17.4 cm, MVP = 5.4 cm, 1498g, EFW 38%, cervix closed, nl ov's

## 2024-02-10 NOTE — Progress Notes (Signed)
 HIGH-RISK PREGNANCY VISIT Patient name: Monique Forbes MRN [id number]  Date of birth: 12-18-1990 Chief Complaint:   Routine Prenatal Visit  History of Present Illness:   Monique Forbes is a 33 y.o. G21P1051 female at [redacted]w[redacted]d with an Estimated Date of Delivery: 04/20/24 being seen today for ongoing management of a high-risk pregnancy complicated by:  1) Class B DM -Pt did not have log, but does think her sugars are high -Patient taking metformin  1 tablet twice daily though based on prior notes patient was supposed to be taking 2 tablets at night - She notes that she has been struggling to eat regular and notes that she has an aversion to meat, chicken or fish.  When asked about protein intake, she was not sure  -Fastings about 120s -after meals 160s   2) Chronic HTN Occasional headache, better than previously  3) HSV 4) h/o seizure d/o 5) Anemia    Today she reports no complaints.  Patient previously seen in MAU due to concern for elevated BP and blurry vision.  BP normal while in MAU, diagnosed with hypokalemia and treated  Contractions: Not present. Vag. Bleeding: None.  Movement: Present. denies leaking of fluid.      09/30/2023    9:26 AM 03/20/2021   10:24 AM 01/10/2021   10:46 AM 10/24/2020   10:14 AM 05/30/2020   11:33 AM  Depression screen PHQ 2/9  Decreased Interest 0 0 2 0 0  Down, Depressed, Hopeless 1 0 2 0 0  PHQ - 2 Score 1 0 4 0 0  Altered sleeping 1  2    Tired, decreased energy 1  3    Change in appetite 1  0    Feeling bad or failure about yourself  1  1    Trouble concentrating 0  0    Moving slowly or fidgety/restless 0  0    Suicidal thoughts 0  0    PHQ-9 Score 5   10     Difficult doing work/chores   Somewhat difficult       Data saved with a previous flowsheet row definition     Current Outpatient Medications  Medication Instructions   ACCRUFeR  30 mg, Oral, Daily   albuterol  (VENTOLIN  HFA) 108 (90 Base) MCG/ACT inhaler 1-2 puffs,  Inhalation, Every 6 hours PRN   aspirin  EC 162 mg, Oral, Daily, Swallow whole.   chlorhexidine  (HIBICLENS ) 4 % external liquid Apply a small amount of the solution to warm water and cleanse the affected area twice daily while symptoms persist.   Continuous Glucose Sensor (DEXCOM G7 SENSOR) MISC 1 each, Does not apply, Daily, Change sensor every 10 days   labetalol  (NORMODYNE ) 100 mg, Oral, 2 times daily   levETIRAcetam  (KEPPRA ) 500 mg, Oral, 2 times daily   metFORMIN  (GLUCOPHAGE ) 500 MG tablet 1 tablet in am and 2 at bedtime   mupirocin  ointment (BACTROBAN ) 2 % 1 Application, Topical, 2 times daily   omeprazole  (PRILOSEC) 40 mg, Oral, 2 times daily   ondansetron  (ZOFRAN -ODT) 4 mg, Oral, Every 8 hours PRN   potassium chloride  SA (KLOR-CON  M) 20 MEQ tablet 20 mEq, Oral, Daily   Prenatal Vit-Fe Fumarate-FA (PRENATAL VITAMIN) 27-0.8 MG TABS 1 tablet, Oral, Daily     Review of Systems:   Pertinent items are noted in HPI Denies abnormal vaginal discharge w/ itching/odor/irritation, shortness of breath, chest pain, abdominal pain, severe nausea/vomiting, or problems with urination or bowel movements unless otherwise stated above. Pertinent History Reviewed:  Reviewed past medical,surgical, social, obstetrical and family history.  Reviewed problem list, medications and allergies. Physical Assessment:   Vitals:   02/10/24 1106 02/10/24 1123  BP: (!) 148/89 129/84  Pulse: (!) 101 (!) 103  Weight: 229 lb (103.9 kg)   Body mass index is 38.11 kg/m.           Physical Examination:   General appearance: alert, well appearing, and in no distress  Mental status: normal mood, behavior, speech, dress, motor activity, and thought processes  Skin: warm & dry   Extremities:      Cardiovascular: normal heart rate noted  Respiratory: normal respiratory effort, no distress  Abdomen: gravid, soft, non-tender  Pelvic: Cervical exam deferred         Fetal Status:     Movement: Present    Fetal  Surveillance Testing today: Single active female fetus, Breech, FHR = 155 bpm, fundal posterior pl, gr2, AFI = 17.4 cm, MVP = 5.4 cm, 1498g, EFW 38%, cervix closed, nl ov's    Chaperone: N/A    No results found for this or any previous visit (from the past 24 hours).   Assessment & Plan:  High-risk pregnancy: H2E8948 at [redacted]w[redacted]d with an Estimated Date of Delivery: 04/20/24   1. Chronic hypertension affecting pregnancy -stable with current medication  2. Class B DM: currently MTF 500mg  BID -Concern for non-compliance and hyperglycemia - Discussion with patient regarding carb count during meals, reviewed protein and given examples of alternative protein options - Plan to increase to metformin  to 1000 mg twice daily - Patient to return with sugar log at next visit - Discussed potential for early IOL due to hyperglycemia - Normal growth today continue Q 3 to 4 weeks - Patient scheduled for antepartum testing   3. Seizure d/o Continue current meds  4. HSV -[]  plan for valtrex  ~ 34wks due to likelihood of early delivery   Meds: No orders of the defined types were placed in this encounter.   Labs/procedures today: growth scan  Treatment Plan:  as outlined above  Reviewed: Preterm labor symptoms and general obstetric precautions including but not limited to vaginal bleeding, contractions, leaking of fluid and fetal movement were reviewed in detail with the patient.  All questions were answered. Pt has home bp cuff. Check bp weekly, let us  know if >160/110  Follow-up: Return in about 1 week (around 02/17/2024) for 1wk HROB with Matthieu Loftus and then twice weekly as scheduled.   Future Appointments  Date Time Provider Department Center  02/18/2024  9:30 AM Jayne Vonn DEL, MD [facility]  02/25/2024  1:30 PM [facility] - FT IMG 2 [facility] None  02/25/2024  2:30 PM Jayne Vonn DEL, MD [facility]  02/28/2024 10:30 AM [facility] NURSE [facility]  03/10/2024  1:30 PM [facility] US  [facility]  None  03/10/2024  2:30 PM Kizzie Suzen SAUNDERS, CNM [facility]  03/13/2024 11:10 AM [facility] NURSE [facility]  03/16/2024 10:00 AM [facility] US  [facility] None  03/16/2024 10:50 AM Kizzie Suzen SAUNDERS, CNM [facility]  03/19/2024 10:10 AM [facility] NURSE [facility]  03/23/2024 10:00 AM [facility] US  [facility] None  03/23/2024 11:10 AM Jayne Vonn DEL, MD [facility]  03/26/2024 10:10 AM [facility] NURSE [facility]  03/30/2024 10:00 AM [facility] US  [facility] None  03/30/2024 10:50 AM Kizzie Suzen SAUNDERS, CNM [facility]  04/02/2024 10:10 AM [facility] NURSE [facility]  04/06/2024 10:00 AM [facility] FT IMG 2 [facility] None  04/06/2024 10:50 AM Shakita Keir, DO [facility]  04/09/2024  9:50 AM [facility] NURSE [facility]  04/13/2024 10:45 AM [facility] US  [facility] None  04/13/2024 11:30 AM Kizzie Suzen SAUNDERS, CNM [facility]  04/16/2024 10:10 AM [facility] NURSE [facility]    No orders of the defined types were placed in this encounter.   Deniya Craigo, DO Attending Obstetrician & Gynecologist, [facility] for [employer], [facility]

## 2024-02-11 ENCOUNTER — Other Ambulatory Visit: Payer: Self-pay | Admitting: Obstetrics & Gynecology

## 2024-02-11 MED ORDER — METFORMIN HCL 500 MG PO TABS: 1000.0000 mg | ORAL_TABLET | Freq: Two times a day (BID) | ORAL | 6 refills | Status: AC

## 2024-02-11 NOTE — Progress Notes (Signed)
 Resent metformin  order  Daishon Chui, DO Attending Obstetrician & Gynecologist, [facility] for [employer], [facility]

## 2024-02-18 ENCOUNTER — Encounter: Admitting: Obstetrics & Gynecology

## 2024-02-19 ENCOUNTER — Other Ambulatory Visit: Payer: Self-pay | Admitting: Obstetrics & Gynecology

## 2024-02-19 DIAGNOSIS — O0990 Supervision of high risk pregnancy, unspecified, unspecified trimester: Secondary | ICD-10-CM

## 2024-02-19 DIAGNOSIS — O10919 Unspecified pre-existing hypertension complicating pregnancy, unspecified trimester: Secondary | ICD-10-CM

## 2024-02-19 DIAGNOSIS — Z23 Encounter for immunization: Secondary | ICD-10-CM

## 2024-02-19 DIAGNOSIS — O24319 Unspecified pre-existing diabetes mellitus in pregnancy, unspecified trimester: Secondary | ICD-10-CM

## 2024-02-19 DIAGNOSIS — Z3A32 32 weeks gestation of pregnancy: Secondary | ICD-10-CM

## 2024-02-19 DIAGNOSIS — O09529 Supervision of elderly multigravida, unspecified trimester: Secondary | ICD-10-CM

## 2024-02-20 ENCOUNTER — Telehealth: Payer: Self-pay | Admitting: *Deleted

## 2024-02-20 NOTE — Telephone Encounter (Signed)
 Patient woke up from a nap and noticed the left side of head hurting along with tachycardia. She also vomited and states she just felt off.  BP was elevated this morning after taking meds. Denies fever, chills. Feels like she might be dehydrated but is unsure.  Advised patient to go to MAU for evaluation. Pt agreeable to plan.

## 2024-02-24 ENCOUNTER — Other Ambulatory Visit

## 2024-02-25 ENCOUNTER — Encounter: Payer: Self-pay | Admitting: Obstetrics & Gynecology

## 2024-02-25 ENCOUNTER — Ambulatory Visit: Admitting: Obstetrics & Gynecology

## 2024-02-25 ENCOUNTER — Other Ambulatory Visit: Admitting: Radiology

## 2024-02-25 VITALS — BP 130/84 | HR 98 | Wt 231.0 lb

## 2024-02-25 DIAGNOSIS — O0990 Supervision of high risk pregnancy, unspecified, unspecified trimester: Secondary | ICD-10-CM

## 2024-02-25 DIAGNOSIS — Z7984 Long term (current) use of oral hypoglycemic drugs: Secondary | ICD-10-CM | POA: Diagnosis not present

## 2024-02-25 DIAGNOSIS — O24319 Unspecified pre-existing diabetes mellitus in pregnancy, unspecified trimester: Secondary | ICD-10-CM

## 2024-02-25 DIAGNOSIS — D649 Anemia, unspecified: Secondary | ICD-10-CM | POA: Diagnosis not present

## 2024-02-25 DIAGNOSIS — O24113 Pre-existing diabetes mellitus, type 2, in pregnancy, third trimester: Secondary | ICD-10-CM | POA: Diagnosis not present

## 2024-02-25 DIAGNOSIS — O99283 Endocrine, nutritional and metabolic diseases complicating pregnancy, third trimester: Secondary | ICD-10-CM | POA: Diagnosis not present

## 2024-02-25 DIAGNOSIS — O10913 Unspecified pre-existing hypertension complicating pregnancy, third trimester: Secondary | ICD-10-CM | POA: Diagnosis not present

## 2024-02-25 DIAGNOSIS — O09523 Supervision of elderly multigravida, third trimester: Secondary | ICD-10-CM | POA: Diagnosis not present

## 2024-02-25 DIAGNOSIS — Z3A32 32 weeks gestation of pregnancy: Secondary | ICD-10-CM

## 2024-02-25 DIAGNOSIS — O09529 Supervision of elderly multigravida, unspecified trimester: Secondary | ICD-10-CM

## 2024-02-25 DIAGNOSIS — O24913 Unspecified diabetes mellitus in pregnancy, third trimester: Secondary | ICD-10-CM | POA: Diagnosis not present

## 2024-02-25 DIAGNOSIS — O10919 Unspecified pre-existing hypertension complicating pregnancy, unspecified trimester: Secondary | ICD-10-CM

## 2024-02-25 DIAGNOSIS — E282 Polycystic ovarian syndrome: Secondary | ICD-10-CM | POA: Diagnosis not present

## 2024-02-25 DIAGNOSIS — O99013 Anemia complicating pregnancy, third trimester: Secondary | ICD-10-CM | POA: Diagnosis not present

## 2024-02-25 DIAGNOSIS — E119 Type 2 diabetes mellitus without complications: Secondary | ICD-10-CM | POA: Diagnosis not present

## 2024-02-25 MED ORDER — GLYBURIDE 2.5 MG PO TABS: 2.5000 mg | ORAL_TABLET | Freq: Every day | ORAL | 1 refills | Status: AC

## 2024-02-25 NOTE — Progress Notes (Signed)
 US : GA = 32+1 weeks Single active female fetus, cephalic, FHR = 134 bpm, fundal posterior, Left Lateral placenta, gr2, AFI = 18.8 cm, MVP = 6.3 cm, BPP = 8/8, RI: 0.58, 0.64, 0.65  avg: 0.62 57%,  cervix closed, ov's seen with appearance of PCOS

## 2024-02-25 NOTE — Progress Notes (Signed)
 HIGH-RISK PREGNANCY VISIT Patient name: Monique Forbes MRN [id number]  Date of birth: [birth year] Chief Complaint:   Routine Prenatal Visit  History of Present Illness:   Monique Forbes is a 33 y.o. G43P1051 female at [redacted]w[redacted]d with an Estimated Date of Delivery: 04/20/24 being seen today for ongoing management of a high-risk pregnancy complicated by     ICD-10-CM   1. Supervision of high risk pregnancy, antepartum  O09.90     2. Class B DM: ^ MTF 1000mg  BID, add glyburide  2.5 mg qhs (39% EFW)  O24.319     3. Chronic hypertension affecting pregnancy: Labetalol  100 BID  O10.919      .    Today she reports no complaints. Contractions: Not present. Vag. Bleeding: None.  Movement: Present. denies leaking of fluid.      09/30/2023    9:26 AM 03/20/2021   10:24 AM 01/10/2021   10:46 AM 10/24/2020   10:14 AM 05/30/2020   11:33 AM  Depression screen PHQ 2/9  Decreased Interest 0 0 2 0 0  Down, Depressed, Hopeless 1 0 2 0 0  PHQ - 2 Score 1 0 4 0 0  Altered sleeping 1  2    Tired, decreased energy 1  3    Change in appetite 1  0    Feeling bad or failure about yourself  1  1    Trouble concentrating 0  0    Moving slowly or fidgety/restless 0  0    Suicidal thoughts 0  0    PHQ-9 Score 5   10     Difficult doing work/chores   Somewhat difficult       Data saved with a previous flowsheet row definition        09/30/2023    9:27 AM  GAD 7 : Generalized Anxiety Score  Nervous, Anxious, on Edge 0  Control/stop worrying 0  Worry too much - different things 0  Trouble relaxing 0  Restless 0  Easily annoyed or irritable 0  Afraid - awful might happen 0  Total GAD 7 Score 0     Review of Systems:   Pertinent items are noted in HPI Denies abnormal vaginal discharge w/ itching/odor/irritation, headaches, visual changes, shortness of breath, chest pain, abdominal pain, severe nausea/vomiting, or problems with urination or bowel movements unless otherwise stated above. Pertinent  History Reviewed:  Reviewed past medical,surgical, social, obstetrical and family history.  Reviewed problem list, medications and allergies. Physical Assessment:   Vitals:   02/25/24 1423 02/25/24 1426  BP: (!) 141/84 130/84  Pulse: 98   Weight: 231 lb (104.8 kg)   Body mass index is 38.44 kg/m.           Physical Examination:   General appearance: alert, well appearing, and in no distress  Mental status: alert, oriented to person, place, and time  Skin: warm & dry   Extremities:      Cardiovascular: normal heart rate noted  Respiratory: normal respiratory effort, no distress  Abdomen: gravid, soft, non-tender  Pelvic: Cervical exam deferred        Sebaceous cyst not a herpetic outbreak, also left vulvar varicosities Fetal Status:     Movement: Present    Fetal Surveillance Testing today: BPP 8/8 UAD 57%   Chaperone: Aleck preston    No results found for this or any previous visit (from the past 24 hours).  Assessment & Plan:  High-risk pregnancy: H2E8948 at [redacted]w[redacted]d with an Estimated  Date of Delivery: 04/20/24      ICD-10-CM   1. Supervision of high risk pregnancy, antepartum  O09.90     2. Class B DM: ^ MTF 1000mg  BID, add glyburide  2.5 mg qhs (39% EFW)  O24.319     3. Chronic hypertension affecting pregnancy: Labetalol  100 BID  O10.919          Meds:  Meds ordered this encounter  Medications   glyBURIDE  (DIABETA ) 2.5 MG tablet    Sig: Take 1 tablet (2.5 mg total) by mouth at bedtime.    Dispense:  30 tablet    Refill:  1    Orders: No orders of the defined types were placed in this encounter.    Labs/procedures today: U/S  Treatment Plan:  twice weekly surveillance    Follow-up: Return for keep scheduled.   Future Appointments  Date Time Provider Department Center  02/28/2024 10:30 AM [facility] NURSE [facility]  03/03/2024 10:50 AM [facility] NUR[facility]  03/03/2024 11:10 AM Delores Nidia CROME, FNP [facility]  03/10/2024   1:30 PM [facility] - [facility] US  [facility] None  03/10/2024  2:30 PM Kizzie Suzen SAUNDERS, CNM [facility]  03/13/2024 11:10 AM [facility] NURSE [facility]  03/16/2024 10:00 AM [facility] US  [facility] None  03/16/2024 10:50 AM Kizzie Suzen SAUNDERS, CNM [facility]  03/19/2024 10:10 AM [facility] NURSE [facility]  03/23/2024 10:00 AM [facility] US  [facility] None  03/23/2024 11:10 AM Jayne Vonn DEL, MD [facility]  03/26/2024 10:10 AM [facility] NURSE [facility]  03/30/2024 10:00 AM [facility] US  [facility] None  03/30/2024 10:50 AM Kizzie Suzen SAUNDERS, CNM [facility]  04/02/2024 10:10 AM [facility] NURSE [facility]  04/06/2024 10:00 AM [facility] FT IMG 2 [facility] None  04/06/2024 10:50 AM Marilynn Nest, DO [facility]  04/09/2024  9:50 AM [facility] NURSE [facility]  04/13/2024 10:45 AM [facility] US  [facility] None  04/13/2024 11:30 AM Kizzie Suzen SAUNDERS, CNM [facility]  04/16/2024 10:10 AM [facility] NURSE [facility]    No orders of the defined types were placed in this encounter.  Vonn DEL Jayne  Attending Physician for the Center for [facility] 02/25/2024 3:04 PM

## 2024-02-28 ENCOUNTER — Encounter: Payer: Self-pay | Admitting: *Deleted

## 2024-02-28 ENCOUNTER — Ambulatory Visit

## 2024-02-28 VITALS — BP 128/87 | HR 101 | Wt 228.5 lb

## 2024-02-28 DIAGNOSIS — O10913 Unspecified pre-existing hypertension complicating pregnancy, third trimester: Secondary | ICD-10-CM

## 2024-02-28 DIAGNOSIS — E119 Type 2 diabetes mellitus without complications: Secondary | ICD-10-CM

## 2024-02-28 DIAGNOSIS — O10919 Unspecified pre-existing hypertension complicating pregnancy, unspecified trimester: Secondary | ICD-10-CM

## 2024-02-28 DIAGNOSIS — Z3A32 32 weeks gestation of pregnancy: Secondary | ICD-10-CM

## 2024-02-28 DIAGNOSIS — O24319 Unspecified pre-existing diabetes mellitus in pregnancy, unspecified trimester: Secondary | ICD-10-CM

## 2024-02-28 DIAGNOSIS — O24113 Pre-existing diabetes mellitus, type 2, in pregnancy, third trimester: Secondary | ICD-10-CM | POA: Diagnosis not present

## 2024-02-28 DIAGNOSIS — O0990 Supervision of high risk pregnancy, unspecified, unspecified trimester: Secondary | ICD-10-CM

## 2024-02-28 NOTE — Progress Notes (Signed)
" ° °  NURSE VISIT- NST  SUBJECTIVE:  Monique Forbes is a 33 y.o. H2E8948 female at [redacted]w[redacted]d, here for a NST for pregnancy complicated by [facility] and Diabetes: T2DM.  She reports decreased  fetal movement, contractions: none, vaginal bleeding: none, membranes: intact.   OBJECTIVE:  BP 128/87   Pulse (!) 101   Wt 228 lb 8 oz (103.6 kg)   LMP 02/18/2023 (Approximate)   BMI 38.02 kg/m   Appears well, no apparent distress  No results found for this or any previous visit (from the past 24 hours).  NST: FHR baseline 140  bpm, Variability: moderate, Accelerations:present, Decelerations:  Absent= Cat 1/reactive Toco: none   ASSESSMENT: H2E8948 at [redacted]w[redacted]d with CHTN and Diabetes: T2DM NST reactive  PLAN: EFM strip reviewed by Dr. Jayne   Recommendations: keep next appointment as scheduled    Monique Forbes  02/28/2024 11:13 AM  "

## 2024-03-02 ENCOUNTER — Encounter: Payer: Self-pay | Admitting: Women's Health

## 2024-03-02 MED ORDER — PANTOPRAZOLE SODIUM 40 MG PO TBEC: 40.0000 mg | DELAYED_RELEASE_TABLET | Freq: Two times a day (BID) | ORAL | 1 refills | Status: AC

## 2024-03-03 ENCOUNTER — Other Ambulatory Visit

## 2024-03-03 ENCOUNTER — Ambulatory Visit (INDEPENDENT_AMBULATORY_CARE_PROVIDER_SITE_OTHER): Admitting: Obstetrics and Gynecology

## 2024-03-03 ENCOUNTER — Encounter: Payer: Self-pay | Admitting: Obstetrics and Gynecology

## 2024-03-03 VITALS — BP 125/72 | HR 95 | Wt 232.0 lb

## 2024-03-03 DIAGNOSIS — Z7984 Long term (current) use of oral hypoglycemic drugs: Secondary | ICD-10-CM

## 2024-03-03 DIAGNOSIS — D649 Anemia, unspecified: Secondary | ICD-10-CM | POA: Diagnosis not present

## 2024-03-03 DIAGNOSIS — O10919 Unspecified pre-existing hypertension complicating pregnancy, unspecified trimester: Secondary | ICD-10-CM

## 2024-03-03 DIAGNOSIS — Z3A33 33 weeks gestation of pregnancy: Secondary | ICD-10-CM

## 2024-03-03 DIAGNOSIS — O24313 Unspecified pre-existing diabetes mellitus in pregnancy, third trimester: Secondary | ICD-10-CM | POA: Diagnosis not present

## 2024-03-03 DIAGNOSIS — G40909 Epilepsy, unspecified, not intractable, without status epilepticus: Secondary | ICD-10-CM

## 2024-03-03 DIAGNOSIS — E119 Type 2 diabetes mellitus without complications: Secondary | ICD-10-CM

## 2024-03-03 DIAGNOSIS — Z23 Encounter for immunization: Secondary | ICD-10-CM | POA: Diagnosis not present

## 2024-03-03 DIAGNOSIS — O99013 Anemia complicating pregnancy, third trimester: Secondary | ICD-10-CM

## 2024-03-03 DIAGNOSIS — O0990 Supervision of high risk pregnancy, unspecified, unspecified trimester: Secondary | ICD-10-CM

## 2024-03-03 DIAGNOSIS — O10913 Unspecified pre-existing hypertension complicating pregnancy, third trimester: Secondary | ICD-10-CM

## 2024-03-03 DIAGNOSIS — O99353 Diseases of the nervous system complicating pregnancy, third trimester: Secondary | ICD-10-CM | POA: Diagnosis not present

## 2024-03-03 DIAGNOSIS — O24319 Unspecified pre-existing diabetes mellitus in pregnancy, unspecified trimester: Secondary | ICD-10-CM

## 2024-03-03 LAB — POCT URINALYSIS DIPSTICK OB
Blood, UA: NEGATIVE
Glucose, UA: NEGATIVE
Leukocytes, UA: NEGATIVE
Nitrite, UA: NEGATIVE
POC,PROTEIN,UA: NEGATIVE
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
## 2024-03-03 NOTE — Patient Instructions (Signed)

## 2024-03-04 LAB — CBC
Hematocrit: 30.6 % — ABNORMAL LOW (ref 34.0–46.6)
Hemoglobin: 9.9 g/dL — ABNORMAL LOW (ref 11.1–15.9)
MCH: 26 pg — ABNORMAL LOW (ref 26.6–33.0)
MCHC: 32.4 g/dL (ref 31.5–35.7)
MCV: 80 fL (ref 79–97)
Platelets: 292 x10E3/uL (ref 150–450)
RBC: 3.81 x10E6/uL (ref 3.77–5.28)
RDW: 14.8 % (ref 11.7–15.4)
WBC: 11.7 x10E3/uL — ABNORMAL HIGH (ref 3.4–10.8)

## 2024-03-08 ENCOUNTER — Ambulatory Visit: Payer: Self-pay | Admitting: Obstetrics and Gynecology

## 2024-03-09 ENCOUNTER — Other Ambulatory Visit (HOSPITAL_COMMUNITY): Payer: Self-pay | Admitting: Obstetrics and Gynecology

## 2024-03-09 ENCOUNTER — Other Ambulatory Visit: Payer: Self-pay | Admitting: Obstetrics and Gynecology

## 2024-03-09 ENCOUNTER — Other Ambulatory Visit: Payer: Self-pay | Admitting: Obstetrics & Gynecology

## 2024-03-09 DIAGNOSIS — O99019 Anemia complicating pregnancy, unspecified trimester: Secondary | ICD-10-CM | POA: Insufficient documentation

## 2024-03-09 DIAGNOSIS — O09523 Supervision of elderly multigravida, third trimester: Secondary | ICD-10-CM

## 2024-03-09 DIAGNOSIS — O0990 Supervision of high risk pregnancy, unspecified, unspecified trimester: Secondary | ICD-10-CM

## 2024-03-09 DIAGNOSIS — O10919 Unspecified pre-existing hypertension complicating pregnancy, unspecified trimester: Secondary | ICD-10-CM

## 2024-03-09 DIAGNOSIS — O24319 Unspecified pre-existing diabetes mellitus in pregnancy, unspecified trimester: Secondary | ICD-10-CM

## 2024-03-10 ENCOUNTER — Encounter: Admitting: Women's Health

## 2024-03-10 ENCOUNTER — Other Ambulatory Visit

## 2024-03-12 ENCOUNTER — Telehealth: Payer: Self-pay

## 2024-03-12 ENCOUNTER — Encounter: Payer: Self-pay | Admitting: Obstetrics and Gynecology

## 2024-03-12 NOTE — Telephone Encounter (Addendum)
 Auth Submission: NO AUTH NEEDED Site of care: Site of care: [facility] Payer: [place] [employer]  Medication & CPT/J Code(s) submitted: Venofer (Iron Sucrose) J1756 Diagnosis Code:  Route of submission (phone, fax, portal): phone Phone # Fax # Auth type: Buy/Bill HB Units/visits requested: 300mg  x 3 doses Reference number: [id number] Approval from: 03/12/24 to 09/08/24

## 2024-03-13 ENCOUNTER — Other Ambulatory Visit

## 2024-03-16 ENCOUNTER — Encounter: Admitting: Women's Health

## 2024-03-16 ENCOUNTER — Other Ambulatory Visit

## 2024-03-16 NOTE — H&P (Signed)
 "Monique Forbes is a 34 y.o. female patient.  No diagnosis found.  Past Medical History:   Diagnosis Date    Anemia     Diabetes mellitus (HCC)     Type 2    GERD (gastroesophageal reflux disease)     Hypertension     Chronic hypertension     OB History       Gravida   1    Para        Term        Preterm        AB        Living             SAB        IAB        Ectopic        Molar        Multiple        Live Births                  [redacted]w[redacted]d  Estimated Date of Delivery: 04/20/24  Allergies   Allergen Reactions    Trazodone      Pt states medication  gives her seizures      Principal Problem:    [redacted] weeks gestation of pregnancy  Resolved Problems:    * No resolved hospital problems. *    Blood pressure 132/76, pulse 95, temperature 98 F (36.7 C), temperature source Oral, resp. rate 16, SpO2 98%.    Maternal Medical History:   Reason for admission: CC: Not feeing well  HPI: 34 y/o G1P0 @ 35wk pregnancy complicated by AMA, CHTN, T2DM, and anemia,   Present to labor unit of not feeling well. No specific concern. Patient recently move to area and trying to establish care.   Patient compliance with labetalol 200 mg BID and metformin  1000 mg BID. Patient is also scheduled for iron transfusion at her OB in [place], [place]. She admits to good FM, denies VB, or LOF. Admits to irregular ctx    Contractions: Onset was 3-5 hours ago.    Frequency: irregular.    Perceived severity is mild.    Fetal activity: Perceived fetal activity is normal.    Prenatal complications: PIH.    Prenatal Complications - Diabetes: type 2.  Diabetes is managed by oral agent (monotherapy).        Maternal Exam:   Uterine Assessment: Contraction frequency is rare.   Abdomen: Patient reports no abdominal tenderness. Introitus: Normal vulva. Normal vagina.  Ferning test: not done.   Nitrazine test: not done.  Amniotic fluid character: not assessed.  Pelvis: adequate for delivery.    Cervix: Cervix evaluated by digital exam.    Cervix closed    Fetal  Exam  Fetal Monitor Review: Mode: ultrasound.    Baseline rate: 130.   Variability: moderate (6-25 bpm).    Pattern: accelerations present and no decelerations.    Fetal State Assessment: Category I - tracings are normal.      Assessment:  Not in labor.   Membrane status: intact.   Fetal well-being: normal.   34 y/o G1P0 @ 35wk present to rule of labor.  Pregnancy complicated by [facility] and T2DM,  Patient desire to establish care.    Plan:  BP monitoring: BP all normal range  T2DM: Random BS 146  Fetal monitoring: CAT 1  Felling better  No contraction  OK to discharge home  Information given for [facility] to schedule appt

## 2024-03-17 ENCOUNTER — Other Ambulatory Visit

## 2024-03-18 ENCOUNTER — Encounter: Payer: Self-pay | Admitting: *Deleted

## 2024-03-19 ENCOUNTER — Encounter: Attending: Obstetrics and Gynecology | Admitting: Emergency Medicine

## 2024-03-19 ENCOUNTER — Ambulatory Visit: Admitting: *Deleted

## 2024-03-19 ENCOUNTER — Ambulatory Visit: Admitting: Obstetrics & Gynecology

## 2024-03-19 VITALS — BP 136/89 | HR 96 | Wt 236.0 lb

## 2024-03-19 VITALS — BP 127/66 | HR 98 | Temp 97.3°F | Resp 16

## 2024-03-19 DIAGNOSIS — O98513 Other viral diseases complicating pregnancy, third trimester: Secondary | ICD-10-CM

## 2024-03-19 DIAGNOSIS — O99613 Diseases of the digestive system complicating pregnancy, third trimester: Secondary | ICD-10-CM

## 2024-03-19 DIAGNOSIS — O99343 Other mental disorders complicating pregnancy, third trimester: Secondary | ICD-10-CM | POA: Diagnosis not present

## 2024-03-19 DIAGNOSIS — O24319 Unspecified pre-existing diabetes mellitus in pregnancy, unspecified trimester: Secondary | ICD-10-CM

## 2024-03-19 DIAGNOSIS — O0990 Supervision of high risk pregnancy, unspecified, unspecified trimester: Secondary | ICD-10-CM

## 2024-03-19 DIAGNOSIS — K219 Gastro-esophageal reflux disease without esophagitis: Secondary | ICD-10-CM

## 2024-03-19 DIAGNOSIS — O24313 Unspecified pre-existing diabetes mellitus in pregnancy, third trimester: Secondary | ICD-10-CM | POA: Diagnosis not present

## 2024-03-19 DIAGNOSIS — D649 Anemia, unspecified: Secondary | ICD-10-CM

## 2024-03-19 DIAGNOSIS — O10919 Unspecified pre-existing hypertension complicating pregnancy, unspecified trimester: Secondary | ICD-10-CM

## 2024-03-19 DIAGNOSIS — E119 Type 2 diabetes mellitus without complications: Secondary | ICD-10-CM | POA: Diagnosis not present

## 2024-03-19 DIAGNOSIS — O10913 Unspecified pre-existing hypertension complicating pregnancy, third trimester: Secondary | ICD-10-CM

## 2024-03-19 DIAGNOSIS — O99019 Anemia complicating pregnancy, unspecified trimester: Secondary | ICD-10-CM

## 2024-03-19 DIAGNOSIS — O99013 Anemia complicating pregnancy, third trimester: Secondary | ICD-10-CM | POA: Diagnosis not present

## 2024-03-19 DIAGNOSIS — G40909 Epilepsy, unspecified, not intractable, without status epilepticus: Secondary | ICD-10-CM

## 2024-03-19 DIAGNOSIS — O09523 Supervision of elderly multigravida, third trimester: Secondary | ICD-10-CM | POA: Diagnosis not present

## 2024-03-19 DIAGNOSIS — B009 Herpesviral infection, unspecified: Secondary | ICD-10-CM | POA: Diagnosis not present

## 2024-03-19 DIAGNOSIS — Z3A35 35 weeks gestation of pregnancy: Secondary | ICD-10-CM | POA: Diagnosis not present

## 2024-03-19 MED ORDER — SODIUM CHLORIDE 0.9 % IV SOLN
300.0000 mg | Freq: Once | INTRAVENOUS | Status: AC
  Administered 2024-03-19: 300 mg via INTRAVENOUS
  Filled 2024-03-19: qty 5

## 2024-03-19 MED ORDER — DIPHENHYDRAMINE HCL 25 MG PO CAPS
25.0000 mg | ORAL_CAPSULE | Freq: Once | ORAL | Status: AC
  Administered 2024-03-19: 25 mg via ORAL

## 2024-03-19 MED ORDER — ACETAMINOPHEN 325 MG PO TABS
650.0000 mg | ORAL_TABLET | Freq: Once | ORAL | Status: AC
  Administered 2024-03-19: 650 mg via ORAL

## 2024-03-19 MED ORDER — VALACYCLOVIR HCL 500 MG PO TABS: 500.0000 mg | ORAL_TABLET | Freq: Two times a day (BID) | ORAL | 2 refills | Status: DC

## 2024-03-19 NOTE — Progress Notes (Signed)
 "  HIGH-RISK PREGNANCY VISIT Patient name: Monique Forbes MRN [id number]  Date of birth: 1990-11-09 Chief Complaint:   Routine Prenatal Visit  History of Present Illness:   Monique Forbes is a 34 y.o. G28P1051 female at [redacted]w[redacted]d with an Estimated Date of Delivery: 04/20/24 being seen today for ongoing management of a high-risk pregnancy complicated by:  1) Class B DM -Patient has Dexcom, forgot log, but states overall they have been within normal range -Continue metformin  500 a.m., 1000 mg p.m. 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 Vag. Bleeding: None.  Movement: Present. denies leaking of fluid.      09/30/2023    9:26 AM 03/20/2021   10:24 AM 01/10/2021   10:46 AM 10/24/2020   10:14 AM 05/30/2020   11:33 AM  Depression screen PHQ 2/9  Decreased Interest 0 0 2 0 0  Down, Depressed, Hopeless 1 0 2 0 0  PHQ - 2 Score 1 0 4 0 0  Altered sleeping 1  2    Tired, decreased energy 1  3    Change in appetite 1  0    Feeling bad or failure about yourself  1  1    Trouble concentrating 0  0    Moving slowly or fidgety/restless 0  0    Suicidal thoughts 0  0    PHQ-9 Score 5   10     Difficult doing work/chores   Somewhat difficult       Data saved with a previous flowsheet row definition     Current Outpatient Medications  Medication Instructions   albuterol  (VENTOLIN  HFA) 108 (90 Base) MCG/ACT inhaler 1-2 puffs, Inhalation, Every 6 hours PRN   aspirin  EC 162 mg, Oral, Daily, Swallow whole.   Continuous Glucose Sensor (DEXCOM G7 SENSOR) MISC 1 each, Does not apply, Daily, Change sensor every 10 days   Ferrous Sulfate  (IRON  PO) Every other day   glyBURIDE  (DIABETA ) 2.5 mg, Oral, Daily at bedtime    labetalol  (NORMODYNE ) 100 mg, Oral, 2 times daily   levETIRAcetam  (KEPPRA ) 500 mg, Oral, 2 times daily   metFORMIN  (GLUCOPHAGE ) 1,000 mg, Oral, 2 times daily with meals   ondansetron  (ZOFRAN -ODT) 4 mg, Oral, Every 8 hours PRN   pantoprazole  (PROTONIX ) 40 mg, Oral, 2 times daily   Prenatal Vit-Fe Fumarate-FA (PRENATAL VITAMIN) 27-0.8 MG TABS 1 tablet, Oral, Daily   valACYclovir  (VALTREX ) 500 mg, Oral, 2 times daily     Review of Systems:   Pertinent items are noted in HPI Denies abnormal vaginal discharge w/ itching/odor/irritation, headaches, visual changes, shortness of breath, chest pain, abdominal pain, severe nausea/vomiting, or problems with urination or bowel movements unless otherwise stated above. Pertinent History Reviewed:  Reviewed past medical,surgical, social, obstetrical and family history.  Reviewed problem list, medications and allergies. Physical Assessment:   Vitals:   03/19/24 0840  BP: 136/89  Pulse: 96  Weight: 236 lb (107 kg)  Body mass index is 39.27 kg/m.           Physical Examination:   General appearance: alert, well appearing, and in no distress  Mental status: normal mood, behavior, speech, dress, motor activity, and thought processes  Skin: warm & dry   Extremities:  Cardiovascular: normal heart rate noted  Respiratory: normal respiratory effort, no distress  Abdomen: gravid, soft, non-tender  Pelvic: Cervical exam deferred         Fetal Status:     Movement: Present    Fetal Surveillance Testing today: NST  NST being performed due to Class B DM, chronic HTN   Fetal Monitoring:  Baseline: 140 bpm, Variability: moderate, Accelerations: present, The accelerations are >15 bpm and more than 2 in 20 minutes, and Decelerations: Absent     Final diagnosis:   Reactive NST    Chaperone: N/A    No results found for this or any previous visit (from the past 24 hours).   Assessment & Plan:  High-risk pregnancy: H2E8948 at [redacted]w[redacted]d with an  Estimated Date of Delivery: 04/20/24   1) Class B DM -continue with current regimen -reactive NST today -continue antepartum testing -IOL ~ 38-39wk  2) Chronic HTN 3) HSV -suppression sent in 4)h/o Seizure d/o -continue current meds 5) Dep/Anxiety 6)Anemia -infusions scheduled  7) Acid reflux- may increase to twice daily - Rx already in   Meds:  Meds ordered this encounter  Medications   valACYclovir  (VALTREX ) 500 MG tablet    Sig: Take 1 tablet (500 mg total) by mouth 2 (two) times daily.    Dispense:  60 tablet    Refill:  2    Labs/procedures today: NST  Treatment Plan:  []  GBS next visit, care as outlined above  Reviewed: Preterm labor symptoms and general obstetric precautions including but not limited to vaginal bleeding, contractions, leaking of fluid and fetal movement were reviewed in detail with the patient.  All questions were answered. Pt has home bp cuff. Check bp weekly, let us  know if >160/110  Follow-up: Return for as scheduled.   Future Appointments  Date Time Provider Department Center  03/19/2024 11:30 AM APINF-CHAIR 1 AP-INFCTR None  03/23/2024 10:00 AM [facility] - FTOBGYN US  [facility]-FTIMG None  03/23/2024 11:10 AM Jayne Vonn DEL, MD [facility]-FT FTOBGYN  03/26/2024 10:10 AM [facility]-FTOBGYN NURSE [facility]TO[facility]YN  03/30/2024 10:00 AM [facility] - FTOBGYN US  [facility]-FTIMG None  03/30/2024 10:50 AM Kizzie Suzen SAUNDERS, CNM [facility]-FT FTOBGYN  04/02/2024 10:10 AM [facility]-FTOBGYN NURSE [facility] FTOBGYN  04/06/2024 10:00 AM [facility] - FT IMG 2 [facility]-FTIMG None  04/06/2024 10:50 AM Marilynn Nest, DO [facility]-FT FTOBGYN  04/09/2024  9:50 AM [facility]-FTOBGYN NURSE [facility]FT FTOBGYN  04/13/2024 10:45 AM [facility] - FTOBGYN US  [facility]-FTIMG None  04/13/2024 11:30 AM Kizzie Suzen SAUNDERS, CNM [facility]-FT FTOBGYN  04/16/2024 10:10 AM [facility]-FTOBGYN NURSE [facility]-FT FTOBGYN    No orders of the defined types were placed in this encounter.   Idaly Verret, DO Attending Obstetrician & Gynecologist, [facility] for [facility], [facility]    "

## 2024-03-19 NOTE — Progress Notes (Signed)
 Diagnosis: Iron  Deficiency Anemia  Provider:  Nidia Daring FNP  Procedure: IV Infusion  IV Type: Peripheral, IV Location: R Antecubital  Venofer  (Iron  Sucrose), Dose: 300 mg  Infusion Start Time: 1218  Infusion Stop Time: 1354  Post Infusion IV Care: Observation period completed and Peripheral IV Discontinued  Discharge: Condition: Good, Destination: Home . AVS Declined  Performed by:  Delon ONEIDA Officer, RN

## 2024-03-23 ENCOUNTER — Other Ambulatory Visit

## 2024-03-23 ENCOUNTER — Other Ambulatory Visit (HOSPITAL_COMMUNITY)
Admission: RE | Admit: 2024-03-23 | Discharge: 2024-03-23 | Disposition: A | Source: Ambulatory Visit | Attending: Obstetrics & Gynecology | Admitting: Obstetrics & Gynecology

## 2024-03-23 ENCOUNTER — Ambulatory Visit: Admitting: Obstetrics & Gynecology

## 2024-03-23 VITALS — BP 133/84 | HR 106 | Wt 232.4 lb

## 2024-03-23 DIAGNOSIS — O0990 Supervision of high risk pregnancy, unspecified, unspecified trimester: Secondary | ICD-10-CM | POA: Insufficient documentation

## 2024-03-23 DIAGNOSIS — O10913 Unspecified pre-existing hypertension complicating pregnancy, third trimester: Secondary | ICD-10-CM | POA: Diagnosis not present

## 2024-03-23 DIAGNOSIS — Z3A36 36 weeks gestation of pregnancy: Secondary | ICD-10-CM | POA: Diagnosis not present

## 2024-03-23 DIAGNOSIS — O09523 Supervision of elderly multigravida, third trimester: Secondary | ICD-10-CM

## 2024-03-23 DIAGNOSIS — O09893 Supervision of other high risk pregnancies, third trimester: Secondary | ICD-10-CM | POA: Diagnosis not present

## 2024-03-23 DIAGNOSIS — E119 Type 2 diabetes mellitus without complications: Secondary | ICD-10-CM

## 2024-03-23 DIAGNOSIS — O10919 Unspecified pre-existing hypertension complicating pregnancy, unspecified trimester: Secondary | ICD-10-CM

## 2024-03-23 DIAGNOSIS — Z3A32 32 weeks gestation of pregnancy: Secondary | ICD-10-CM

## 2024-03-23 DIAGNOSIS — O24319 Unspecified pre-existing diabetes mellitus in pregnancy, unspecified trimester: Secondary | ICD-10-CM

## 2024-03-23 DIAGNOSIS — O24313 Unspecified pre-existing diabetes mellitus in pregnancy, third trimester: Secondary | ICD-10-CM | POA: Diagnosis not present

## 2024-03-23 DIAGNOSIS — Z3A35 35 weeks gestation of pregnancy: Secondary | ICD-10-CM

## 2024-03-23 DIAGNOSIS — O24113 Pre-existing diabetes mellitus, type 2, in pregnancy, third trimester: Secondary | ICD-10-CM | POA: Diagnosis not present

## 2024-03-23 DIAGNOSIS — Z7984 Long term (current) use of oral hypoglycemic drugs: Secondary | ICD-10-CM | POA: Diagnosis not present

## 2024-03-23 DIAGNOSIS — O09529 Supervision of elderly multigravida, unspecified trimester: Secondary | ICD-10-CM

## 2024-03-23 MED ORDER — LABETALOL HCL 100 MG PO TABS: 200.0000 mg | ORAL_TABLET | Freq: Two times a day (BID) | ORAL | 0 refills | Status: DC

## 2024-03-23 NOTE — Progress Notes (Addendum)
 US  36 wks,cephalic,BPP 8/8,fundal left lateral placenta gr 3,FHR 120 bpm,RI .62,..60,.58,.63=64%,AFI 18 cm,EFW 2863 g 55%

## 2024-03-23 NOTE — Progress Notes (Signed)
 "   HIGH-RISK PREGNANCY VISIT Patient name: Monique Forbes MRN [id number]  Date of birth: [birth year] Chief Complaint:   Routine Prenatal Visit  History of Present Illness:   Monique Forbes is a 34 y.o. G57P1051 female at [redacted]w[redacted]d with an Estimated Date of Delivery: 04/20/24 being seen today for ongoing management of a high-risk pregnancy complicated by     ICD-10-CM   1. Supervision of high risk pregnancy, antepartum  O09.90     2. Class B DM: metformin  500 qAM and 1000 mg qhs, glyburide  2.5 mg qhs  O24.319     3. Chronic hypertension affecting pregnancy: ^labetalol  200 mg BID  O10.919      .    Today she reports no complaints. Contractions: Not present. Vag. Bleeding: None.  Movement: Present. denies leaking of fluid.      03/19/2024   11:46 AM 09/30/2023    9:26 AM 03/20/2021   10:24 AM 01/10/2021   10:46 AM 10/24/2020   10:14 AM  Depression screen PHQ 2/9  Decreased Interest 0 0 0 2 0  Down, Depressed, Hopeless 0 1 0 2 0  PHQ - 2 Score 0 1 0 4 0  Altered sleeping  1  2   Tired, decreased energy  1  3   Change in appetite  1  0   Feeling bad or failure about yourself   1  1   Trouble concentrating  0  0   Moving slowly or fidgety/restless  0  0   Suicidal thoughts  0  0   PHQ-9 Score  5   10    Difficult doing work/chores    Somewhat difficult      Data saved with a previous flowsheet row definition        09/30/2023    9:27 AM  GAD 7 : Generalized Anxiety Score  Nervous, Anxious, on Edge 0  Control/stop worrying 0  Worry too much - different things 0  Trouble relaxing 0  Restless 0  Easily annoyed or irritable 0  Afraid - awful might happen 0  Total GAD 7 Score 0     Review of Systems:   Pertinent items are noted in HPI Denies abnormal vaginal discharge w/ itching/odor/irritation, headaches, visual changes, shortness of breath, chest pain, abdominal pain, severe nausea/vomiting, or problems with urination or bowel movements unless otherwise stated  above. Pertinent History Reviewed:  Reviewed past medical,surgical, social, obstetrical and family history.  Reviewed problem list, medications and allergies. Physical Assessment:   Vitals:   03/23/24 1112 03/23/24 1136  BP: (!) 133/91 133/84  Pulse: (!) 106   Weight: 232 lb 6.4 oz (105.4 kg)   Body mass index is 38.67 kg/m.           Physical Examination:   General appearance: alert, well appearing, and in no distress  Mental status: alert, oriented to person, place, and time  Skin: warm & dry   Extremities:      Cardiovascular: normal heart rate noted  Respiratory: normal respiratory effort, no distress  Abdomen: gravid, soft, non-tender  Pelvic: Cervical exam performed LTC        Fetal Status:     Movement: Present    Fetal Surveillance Testing today: BPP 8/8 UAD normal   Chaperone: Alan Fischer    No results found for this or any previous visit (from the past 24 hours).  Assessment & Plan:  High-risk pregnancy: H2E8948 at [redacted]w[redacted]d with an Estimated Date of Delivery: 04/20/24  ICD-10-CM   1. Supervision of high risk pregnancy, antepartum  O09.90     2. Class B DM: metformin  500 qAM and 1000 mg qhs, glyburide  2.5 mg qhs  O24.319     3. Chronic hypertension affecting pregnancy: ^labetalol  200 mg BID  O10.919          Meds:  Meds ordered this encounter  Medications   labetalol  (NORMODYNE ) 100 MG tablet    Sig: Take 2 tablets (200 mg total) by mouth 2 (two) times daily.    Dispense:  120 tablet    Refill:  0    Orders: No orders of the defined types were placed in this encounter.    Labs/procedures today: U/S  Treatment Plan:  twice weekly surveillance, induction timing TBD  Reviewed: Preterm labor symptoms and general obstetric precautions including but not limited to vaginal bleeding, contractions, leaking of fluid and fetal movement were reviewed in detail with the patient.  All questions were answered. Does have home bp cuff. Office bp cuff given: not  applicable. Check bp twice daily, let us  know if consistently >150 and/or >95.  Follow-up: No follow-ups on file.   Future Appointments  Date Time Provider Department Center  03/26/2024 10:10 AM [facility]-FTOBGYN NURSE [facility]-FT FTOBGYN  03/27/2024 11:00 AM APINF-CHAIR 1 AP-INFCTR None  03/30/2024 10:00 AM [facility] - FTOBGYN US  C[facility]-FTIMG None  03/30/2024 10:50 AM Kizzie Suzen SAUNDERS, CNM C[facility]-FT FTOBGYN  04/02/2024 10:10 AM [facility]-FTOBGYN NURSE C[facility]-FT FTOBGYN  04/02/2024 11:00 AM APINF-CHAIR 1 AP-INFCTR None  04/06/2024 10:00 AM [facility] - FT IMG 2 C[facility]-FTIMG None  04/06/2024 10:50 AM Marilynn Nest, DO [facility]-FT FTOBGYN  04/09/2024  9:50 AM [facility]-FTOBGYN NURSE C[facility]-FT FTOBGYN  04/13/2024 10:45 AM [facility] - FTOBGYN US  C[facility]-FTIMG None  04/13/2024 11:30 AM Kizzie Suzen SAUNDERS, CNM [facility]-FT FTOBGYN  04/16/2024 10:10 AM [facility]-FTOBGYN NURSE C[facility]-FT FTOBGYN    No orders of the defined types were placed in this encounter.  Monique Forbes  Attending Physician for the Center for [facility] 03/23/2024 11:48 AM  "

## 2024-03-23 NOTE — Addendum Note (Signed)
 Addended by: BERNARDO ALAN CROME on: 03/23/2024 12:12 PM   Modules accepted: Orders

## 2024-03-24 LAB — CERVICOVAGINAL ANCILLARY ONLY
Chlamydia: NEGATIVE
Comment: NEGATIVE
Comment: NORMAL
Neisseria Gonorrhea: NEGATIVE

## 2024-03-26 ENCOUNTER — Other Ambulatory Visit

## 2024-03-27 ENCOUNTER — Ambulatory Visit

## 2024-03-27 ENCOUNTER — Other Ambulatory Visit: Payer: Self-pay | Admitting: Obstetrics & Gynecology

## 2024-03-27 VITALS — BP 118/75 | HR 101 | Temp 97.9°F | Resp 17

## 2024-03-27 DIAGNOSIS — Z3A32 32 weeks gestation of pregnancy: Secondary | ICD-10-CM

## 2024-03-27 DIAGNOSIS — O24319 Unspecified pre-existing diabetes mellitus in pregnancy, unspecified trimester: Secondary | ICD-10-CM

## 2024-03-27 DIAGNOSIS — O0990 Supervision of high risk pregnancy, unspecified, unspecified trimester: Secondary | ICD-10-CM

## 2024-03-27 DIAGNOSIS — O09529 Supervision of elderly multigravida, unspecified trimester: Secondary | ICD-10-CM

## 2024-03-27 DIAGNOSIS — O99019 Anemia complicating pregnancy, unspecified trimester: Secondary | ICD-10-CM

## 2024-03-27 DIAGNOSIS — O10919 Unspecified pre-existing hypertension complicating pregnancy, unspecified trimester: Secondary | ICD-10-CM

## 2024-03-27 LAB — CULTURE, BETA STREP (GROUP B ONLY)

## 2024-03-27 MED ORDER — SODIUM CHLORIDE 0.9 % IV SOLN
300.0000 mg | Freq: Once | INTRAVENOUS | Status: AC
  Administered 2024-03-27: 300 mg via INTRAVENOUS
  Filled 2024-03-27: qty 10

## 2024-03-27 MED ORDER — ACETAMINOPHEN 325 MG PO TABS
650.0000 mg | ORAL_TABLET | Freq: Once | ORAL | Status: AC
  Administered 2024-03-27: 650 mg via ORAL

## 2024-03-27 MED ORDER — DIPHENHYDRAMINE HCL 25 MG PO CAPS
25.0000 mg | ORAL_CAPSULE | Freq: Once | ORAL | Status: AC
  Administered 2024-03-27: 25 mg via ORAL

## 2024-03-27 NOTE — Progress Notes (Signed)
 Diagnosis: Iron  Deficiency Anemia  Provider:  Nidia Daring  Procedure: IV Infusion  IV Type: Peripheral, IV Location: R Antecubital  Venofer  (Iron  Sucrose), Dose: 300 mg  Infusion Start Time: 1136  Infusion Stop Time: 1320  Post Infusion IV Care: Observation period completed and Peripheral IV Discontinued  Discharge: Condition: Good, Destination: Home . AVS Declined  Performed by:  Delon ONEIDA Officer, RN

## 2024-03-30 ENCOUNTER — Ambulatory Visit: Payer: Self-pay

## 2024-03-30 ENCOUNTER — Encounter: Payer: Self-pay | Admitting: Women's Health

## 2024-03-30 ENCOUNTER — Ambulatory Visit: Payer: Self-pay | Admitting: Women's Health

## 2024-03-30 VITALS — BP 132/87 | HR 103 | Wt 236.0 lb

## 2024-03-30 DIAGNOSIS — E119 Type 2 diabetes mellitus without complications: Secondary | ICD-10-CM

## 2024-03-30 DIAGNOSIS — O10913 Unspecified pre-existing hypertension complicating pregnancy, third trimester: Secondary | ICD-10-CM

## 2024-03-30 DIAGNOSIS — O09523 Supervision of elderly multigravida, third trimester: Secondary | ICD-10-CM | POA: Diagnosis not present

## 2024-03-30 DIAGNOSIS — O99013 Anemia complicating pregnancy, third trimester: Secondary | ICD-10-CM

## 2024-03-30 DIAGNOSIS — O09529 Supervision of elderly multigravida, unspecified trimester: Secondary | ICD-10-CM

## 2024-03-30 DIAGNOSIS — E282 Polycystic ovarian syndrome: Secondary | ICD-10-CM | POA: Diagnosis not present

## 2024-03-30 DIAGNOSIS — O24319 Unspecified pre-existing diabetes mellitus in pregnancy, unspecified trimester: Secondary | ICD-10-CM

## 2024-03-30 DIAGNOSIS — O10919 Unspecified pre-existing hypertension complicating pregnancy, unspecified trimester: Secondary | ICD-10-CM

## 2024-03-30 DIAGNOSIS — O09893 Supervision of other high risk pregnancies, third trimester: Secondary | ICD-10-CM

## 2024-03-30 DIAGNOSIS — Z7984 Long term (current) use of oral hypoglycemic drugs: Secondary | ICD-10-CM

## 2024-03-30 DIAGNOSIS — Z3A37 37 weeks gestation of pregnancy: Secondary | ICD-10-CM

## 2024-03-30 DIAGNOSIS — O0993 Supervision of high risk pregnancy, unspecified, third trimester: Secondary | ICD-10-CM

## 2024-03-30 DIAGNOSIS — O0990 Supervision of high risk pregnancy, unspecified, unspecified trimester: Secondary | ICD-10-CM

## 2024-03-30 DIAGNOSIS — O24113 Pre-existing diabetes mellitus, type 2, in pregnancy, third trimester: Secondary | ICD-10-CM

## 2024-03-30 DIAGNOSIS — D649 Anemia, unspecified: Secondary | ICD-10-CM

## 2024-03-30 DIAGNOSIS — O24119 Pre-existing diabetes mellitus, type 2, in pregnancy, unspecified trimester: Secondary | ICD-10-CM

## 2024-03-30 DIAGNOSIS — Z3A32 32 weeks gestation of pregnancy: Secondary | ICD-10-CM | POA: Diagnosis not present

## 2024-03-30 DIAGNOSIS — O99283 Endocrine, nutritional and metabolic diseases complicating pregnancy, third trimester: Secondary | ICD-10-CM | POA: Diagnosis not present

## 2024-03-30 LAB — POCT URINALYSIS DIPSTICK OB
Glucose, UA: NEGATIVE
Leukocytes, UA: NEGATIVE
Nitrite, UA: NEGATIVE

## 2024-03-30 NOTE — Patient Instructions (Signed)
 Dominiqua, thank you for choosing our office today! We appreciate the opportunity to meet your healthcare needs. You may receive a short survey by mail, e-mail, or through [employer]. If you are happy with your care we would appreciate if you could take just a few minutes to complete the survey questions. We read all of your comments and take your feedback very seriously. Thank you again for choosing our office.  Center for [employer] Team at [facility]  [facility] at [facility] ([address] [place], [place] [address]) Entrance C, located off of [address] Free 24/7 valet parking   Your induction is scheduled for 1/21. Please DO NOT show up at the time you see in MyChart. Someone from Labor & Delivery will call you on the date of your induction to let you know what time to come in. Please keep your phone on and with you at all times, you have 1 hour to respond to them to let them know you are on your way.  Go to the main desk at the [facility] and let them know you are there to be induced. They will send someone from Labor & Delivery to come get you.  You will get a call from a nurse from the hospital within the next day or so to go over some information. If you have any questions, please let us  know.    CLASSES: Go to [email or web address] to register for classes (childbirth, breastfeeding, waterbirth, infant CPR, daddy bootcamp, etc.)  Call the office [phone]) or go to [facility] if: You begin to have strong, frequent contractions Your water breaks.  Sometimes it is a big gush of fluid, sometimes it is just a trickle that keeps getting your panties wet or running down your legs You have vaginal bleeding.  It is normal to have a small amount of spotting if your cervix was checked.  You don't feel your baby moving like normal.  If you don't, get you something to eat and drink and lay down and focus on feeling your baby move.   If your baby is still not  moving like normal, you should call the office or go to [facility].  Call the office [phone]) or go to [facility] hospital for these signs of pre-eclampsia: Severe headache that does not go away with Tylenol  Visual changes- seeing spots, double, blurred vision Pain under your right breast or upper abdomen that does not go away with Tums or heartburn medicine Nausea and/or vomiting Severe swelling in your hands, feet, and face   [place] Pediatricians/Family Doctors [facility]): [address] Luba BROCKS, [phone]           [facility]: [address], [phone]                [facility]): [address], [phone] (call to ask if accepting patients) [facility] Department: [address] [place], [phone]    [facility] Pediatricians/Family Doctors [facility]): [address], [phone] [facility]: [address] [place], [phone] [facility]: [address], [phone]  [facility] Doctors  [facility]): [phone] [facility]: [address], [phone]   [facility] Doctors [facility]: [address], [phone]  [facility] Doctors  [facility]: [phone], [phone]  Home Blood  Pressure Monitoring for Patients   Your provider has recommended that you check your blood pressure (BP) at least once a week at home. If you do not have a blood pressure cuff at home, one will be provided for you. Contact your provider if you have not received your monitor within 1 week.   Helpful Tips for Accurate Home Blood Pressure Checks  Don't smoke, exercise, or drink caffeine 30 minutes before checking your BP Use the restroom before checking your BP (a full bladder can raise your pressure) Relax in a comfortable  upright chair Feet on the ground Left arm resting comfortably on a flat surface at the level of your heart Legs uncrossed Back supported Sit quietly and don't talk Place the cuff on your bare arm Adjust snuggly, so that only two fingertips can fit between your skin and the top of the cuff Check 2 readings separated by at least one minute Keep a log of your BP readings For a visual, please reference this diagram: http://ccnc.care/bpdiagram  Provider Name: [facility]     Phone: [phone]  Zone 1: ALL CLEAR  Continue to monitor your symptoms:  BP reading is less than 140 (top number) or less than 90 (bottom number)  No right upper stomach pain No headaches or seeing spots No feeling nauseated or throwing up No swelling in face and hands  Zone 2: CAUTION Call your doctor's office for any of the following:  BP reading is greater than 140 (top number) or greater than 90 (bottom number)  Stomach pain under your ribs in the middle or right side Headaches or seeing spots Feeling nauseated or throwing up Swelling in face and hands  Zone 3: EMERGENCY  Seek immediate medical care if you have any of the following:  BP reading is greater than160 (top number) or greater than 110 (bottom number) Severe headaches not improving with Tylenol  Serious difficulty catching your breath Any worsening symptoms from Zone 2   Braxton Hicks Contractions Contractions of the uterus can occur throughout pregnancy, but they are not always a sign that you are in labor. You may have practice contractions called Braxton Hicks contractions. These false labor contractions are sometimes confused with true labor. What are Darol Irving contractions? Braxton Hicks contractions are tightening movements that occur in the muscles of the uterus before labor. Unlike true labor contractions, these contractions do not result in opening (dilation) and thinning of the cervix. Toward the end of pregnancy (32-34  weeks), Braxton Hicks contractions can happen more often and may become stronger. These contractions are sometimes difficult to tell apart from true labor because they can be very uncomfortable. You should not feel embarrassed if you go to the hospital with false labor. Sometimes, the only way to tell if you are in true labor is for your health care provider to look for changes in the cervix. The health care provider will do a physical exam and may monitor your contractions. If you are not in true labor, the exam should show that your cervix is not dilating and your water has not broken. If there are no other health problems associated with your pregnancy, it is completely safe for you to be sent home with false labor. You may continue to have Braxton Hicks contractions until you go into true labor. How to tell the difference between true labor and false labor True labor Contractions last 30-70 seconds. Contractions become very regular. Discomfort is usually felt in the top of the uterus, and  it spreads to the lower abdomen and low back. Contractions do not go away with walking. Contractions usually become more intense and increase in frequency. The cervix dilates and gets thinner. False labor Contractions are usually shorter and not as strong as true labor contractions. Contractions are usually irregular. Contractions are often felt in the front of the lower abdomen and in the groin. Contractions may go away when you walk around or change positions while lying down. Contractions get weaker and are shorter-lasting as time goes on. The cervix usually does not dilate or become thin. Follow these instructions at home:  Take over-the-counter and prescription medicines only as told by your health care provider. Keep up with your usual exercises and follow other instructions from your health care provider. Eat and drink lightly if you think you are going into labor. If Braxton Hicks contractions are  making you uncomfortable: Change your position from lying down or resting to walking, or change from walking to resting. Sit and rest in a tub of warm water. Drink enough fluid to keep your urine pale yellow. Dehydration may cause these contractions. Do slow and deep breathing several times an hour. Keep all follow-up prenatal visits as told by your health care provider. This is important. Contact a health care provider if: You have a fever. You have continuous pain in your abdomen. Get help right away if: Your contractions become stronger, more regular, and closer together. You have fluid leaking or gushing from your vagina. You pass blood-tinged mucus (bloody show). You have bleeding from your vagina. You have low back pain that you never had before. You feel your babys head pushing down and causing pelvic pressure. Your baby is not moving inside you as much as it used to. Summary Contractions that occur before labor are called Braxton Hicks contractions, false labor, or practice contractions. Braxton Hicks contractions are usually shorter, weaker, farther apart, and less regular than true labor contractions. True labor contractions usually become progressively stronger and regular, and they become more frequent. Manage discomfort from [facility] contractions by changing position, resting in a warm bath, drinking plenty of water, or practicing deep breathing. This information is not intended to replace advice given to you by your health care provider. Make sure you discuss any questions you have with your health care provider. Document Revised: 02/08/2017 Document Reviewed: 07/12/2016 Elsevier Patient Education  2020 [employer].

## 2024-03-30 NOTE — Progress Notes (Signed)
 US  37 wks,cephalic,BPP 8/8,fundal placenta gr 3,FHR 147 bpm,AFI 20 cm,RI .65,.64,.63,.58=68%,bilaterally enlarged ovaries,known PCOS

## 2024-03-30 NOTE — Progress Notes (Signed)
 "  HIGH-RISK PREGNANCY VISIT Patient name: ONDRIA Forbes MRN [id number]  Date of birth: [birth year] Chief Complaint:   Routine Prenatal Visit  History of Present Illness:   Monique Forbes is a 34 y.o. [id number] female at [redacted]w[redacted]d with an Estimated Date of Delivery: 04/20/24 being seen today for ongoing management of a high-risk pregnancy complicated by chronic hypertension currently on labetalol  200mg  BID and T2DM on metformin  1000mg  BID and glyburide  2.5mg  qhs.    Today she reports did not bring log, reports all sugars wnl. Home bp's <140s/90s. Denies ha, visual changes, ruq/epigastric pain, n/v.   Contractions: Irritability. Vag. Bleeding: None.  Movement: Present. denies leaking of fluid.      03/27/2024   11:06 AM 03/19/2024   11:46 AM 09/30/2023    9:26 AM 03/20/2021   10:24 AM 01/10/2021   10:46 AM  Depression screen PHQ 2/9  Decreased Interest 0 0 0 0 2  Down, Depressed, Hopeless 0 0 1 0 2  PHQ - 2 Score 0 0 1 0 4  Altered sleeping   1  2  Tired, decreased energy   1  3  Change in appetite   1  0  Feeling bad or failure about yourself    1  1  Trouble concentrating   0  0  Moving slowly or fidgety/restless   0  0  Suicidal thoughts   0  0  PHQ-9 Score   5   10   Difficult doing work/chores     Somewhat difficult     Data saved with a previous flowsheet row definition        09/30/2023    9:27 AM  GAD 7 : Generalized Anxiety Score  Nervous, Anxious, on Edge 0  Control/stop worrying 0  Worry too much - different things 0  Trouble relaxing 0  Restless 0  Easily annoyed or irritable 0  Afraid - awful might happen 0  Total GAD 7 Score 0     Review of Systems:   Pertinent items are noted in HPI Denies abnormal vaginal discharge w/ itching/odor/irritation, headaches, visual changes, shortness of breath, chest pain, abdominal pain, severe nausea/vomiting, or problems with urination or bowel movements unless otherwise stated above. Pertinent History Reviewed:  Reviewed past  medical,surgical, social, obstetrical and family history.  Reviewed problem list, medications and allergies. Physical Assessment:   Vitals:   03/30/24 1051 03/30/24 1055  BP: (!) 144/84 132/87  Pulse: (!) 103   Weight: 236 lb (107 kg)   Body mass index is 39.27 kg/m.           Physical Examination:   General appearance: alert, well appearing, and in no distress  Mental status: alert, oriented to person, place, and time  Skin: warm & dry   Extremities:      Cardiovascular: normal heart rate noted  Respiratory: normal respiratory effort, no distress  Abdomen: gravid, soft, non-tender  Pelvic: Cervical exam deferred         Fetal Status:     Movement: Present    Fetal Surveillance Testing today: US  37 wks,cephalic,BPP 8/8,fundal placenta gr 3,FHR 147 bpm,AFI 20 cm,RI .65,.64,.63,.58=68%,bilaterally enlarged ovaries,known PCOS   Chaperone: N/A  Results for orders placed or performed in visit on 03/30/24 (from the past 24 hours)  POC Urinalysis Dipstick OB   Collection Time: 03/30/24 11:35 AM  Result Value Ref Range   Color, UA     Clarity, UA     Glucose, UA Negative Negative  Bilirubin, UA     Ketones, UA Small    Spec Grav, UA     Blood, UA Trace    pH, UA     POC,PROTEIN,UA Trace Negative, Trace, Small (1+), Moderate (2+), Large (3+), 4+   Urobilinogen, UA     Nitrite, UA neg    Leukocytes, UA Negative Negative   Appearance     Odor      Assessment & Plan:  High-risk pregnancy: H2E8948 at [redacted]w[redacted]d with an Estimated Date of Delivery: 04/20/24   1) CHTN, on labetalol  200mg  BID, 1st bp elevated, 2nd better. Reports all home bp's <140/90. Tr protein, asymptomatic. No evidence of pre-e right now.  BPP 8/8, UAD wnl. Check bp's at home, if consistently >140s/90s, pre-e sx, go to [facility].   2) T2DM, stable on metformin  1000mg  BID & glyburide  2.5mg  qhs, EFW 55% at 36w, normal AFI today  3) Anemia> s/p IV Fe x 2, feels better already, no longer eating ice  Meds: No orders of the  defined types were placed in this encounter.  Labs/procedures today: U/S  Treatment Plan:  discussed IOL timing 37-39, w/ both CHTN and DM and 1st bp elevated today recommended sooner than later, no spot tomorrow- IOL scheduled for 1/21 AM.  IOL form faxed and orders placed. Reviewed strict pre-e sx/reasons to go in sooner.   Reviewed: Term labor symptoms and general obstetric precautions including but not limited to vaginal bleeding, contractions, leaking of fluid and fetal movement were reviewed in detail with the patient.  All questions were answered. Does have home bp cuff.   Follow-up: Return for cancel appts; , will schedule pp visit after delivery.   Future Appointments  Date Time Provider Department Center  04/01/2024  7:15 AM [facility]-LD SCHED ROOM MC-INDC None  04/02/2024 10:10 AM [facility]-FTOBGYN NURSE [facility]TO[facility]  04/02/2024 11:00 AM APINF-CHAIR 1 AP-INFCTR None  04/06/2024 10:00 AM [facility] - FT IMG 2 [facility]-FTIMG None  04/06/2024 10:50 AM Marilynn Nest, DO [facility]-FT FTO[facility]  04/09/2024  9:50 AM [facility]GY[facility] NURSE [facility]-FT [facility]TO[facility]  04/13/2024 10:45 AM [facility] - FTOBGYN US  C[facility]-FTIMG None  04/13/2024 11:30 AM Kizzie Suzen SAUNDERS, CNM [facility]-FT FTOBGYN  04/16/2024 10:10 AM [facility]FT[facility]GY[facility] NURSE [facility]-FT FTO[facility]YN    Orders Placed This Encounter  Procedures   POC Urinalysis Dipstick OB   Suzen SAUNDERS Kizzie CNM, [facility] 03/30/2024 12:21 PM  "

## 2024-03-31 ENCOUNTER — Telehealth (HOSPITAL_COMMUNITY): Payer: Self-pay | Admitting: *Deleted

## 2024-03-31 ENCOUNTER — Other Ambulatory Visit: Payer: Self-pay | Admitting: Obstetrics & Gynecology

## 2024-03-31 DIAGNOSIS — O10919 Unspecified pre-existing hypertension complicating pregnancy, unspecified trimester: Secondary | ICD-10-CM

## 2024-03-31 DIAGNOSIS — O09529 Supervision of elderly multigravida, unspecified trimester: Secondary | ICD-10-CM

## 2024-03-31 DIAGNOSIS — O24319 Unspecified pre-existing diabetes mellitus in pregnancy, unspecified trimester: Secondary | ICD-10-CM

## 2024-03-31 DIAGNOSIS — O0990 Supervision of high risk pregnancy, unspecified, unspecified trimester: Secondary | ICD-10-CM

## 2024-03-31 DIAGNOSIS — Z3A38 38 weeks gestation of pregnancy: Secondary | ICD-10-CM

## 2024-03-31 DIAGNOSIS — Z23 Encounter for immunization: Secondary | ICD-10-CM

## 2024-03-31 NOTE — Telephone Encounter (Signed)
 Preadmission screen

## 2024-04-01 ENCOUNTER — Other Ambulatory Visit: Payer: Self-pay

## 2024-04-01 ENCOUNTER — Encounter (HOSPITAL_COMMUNITY): Payer: Self-pay | Admitting: Family Medicine

## 2024-04-01 ENCOUNTER — Inpatient Hospital Stay (HOSPITAL_COMMUNITY)

## 2024-04-01 ENCOUNTER — Inpatient Hospital Stay (HOSPITAL_COMMUNITY)
Admission: RE | Admit: 2024-04-01 | Discharge: 2024-04-07 | DRG: 787 | Disposition: A | Attending: Obstetrics and Gynecology | Admitting: Obstetrics and Gynecology

## 2024-04-01 DIAGNOSIS — A6000 Herpesviral infection of urogenital system, unspecified: Secondary | ICD-10-CM | POA: Diagnosis present

## 2024-04-01 DIAGNOSIS — Z7984 Long term (current) use of oral hypoglycemic drugs: Secondary | ICD-10-CM | POA: Diagnosis not present

## 2024-04-01 DIAGNOSIS — G40909 Epilepsy, unspecified, not intractable, without status epilepticus: Secondary | ICD-10-CM | POA: Diagnosis present

## 2024-04-01 DIAGNOSIS — O1092 Unspecified pre-existing hypertension complicating childbirth: Secondary | ICD-10-CM | POA: Diagnosis present

## 2024-04-01 DIAGNOSIS — O9832 Other infections with a predominantly sexual mode of transmission complicating childbirth: Secondary | ICD-10-CM | POA: Diagnosis present

## 2024-04-01 DIAGNOSIS — Z87891 Personal history of nicotine dependence: Secondary | ICD-10-CM

## 2024-04-01 DIAGNOSIS — O10919 Unspecified pre-existing hypertension complicating pregnancy, unspecified trimester: Secondary | ICD-10-CM | POA: Diagnosis present

## 2024-04-01 DIAGNOSIS — Z79899 Other long term (current) drug therapy: Secondary | ICD-10-CM

## 2024-04-01 DIAGNOSIS — O99354 Diseases of the nervous system complicating childbirth: Secondary | ICD-10-CM | POA: Diagnosis present

## 2024-04-01 DIAGNOSIS — F32A Depression, unspecified: Secondary | ICD-10-CM | POA: Diagnosis present

## 2024-04-01 DIAGNOSIS — O9081 Anemia of the puerperium: Secondary | ICD-10-CM | POA: Diagnosis not present

## 2024-04-01 DIAGNOSIS — Z8249 Family history of ischemic heart disease and other diseases of the circulatory system: Secondary | ICD-10-CM | POA: Diagnosis not present

## 2024-04-01 DIAGNOSIS — Z833 Family history of diabetes mellitus: Secondary | ICD-10-CM

## 2024-04-01 DIAGNOSIS — O114 Pre-existing hypertension with pre-eclampsia, complicating childbirth: Secondary | ICD-10-CM | POA: Diagnosis present

## 2024-04-01 DIAGNOSIS — Z98891 History of uterine scar from previous surgery: Principal | ICD-10-CM

## 2024-04-01 DIAGNOSIS — E0590 Thyrotoxicosis, unspecified without thyrotoxic crisis or storm: Secondary | ICD-10-CM | POA: Diagnosis present

## 2024-04-01 DIAGNOSIS — O0990 Supervision of high risk pregnancy, unspecified, unspecified trimester: Secondary | ICD-10-CM

## 2024-04-01 DIAGNOSIS — O2412 Pre-existing diabetes mellitus, type 2, in childbirth: Secondary | ICD-10-CM | POA: Diagnosis present

## 2024-04-01 DIAGNOSIS — O99214 Obesity complicating childbirth: Secondary | ICD-10-CM | POA: Diagnosis present

## 2024-04-01 DIAGNOSIS — Z8616 Personal history of COVID-19: Secondary | ICD-10-CM | POA: Diagnosis not present

## 2024-04-01 DIAGNOSIS — E282 Polycystic ovarian syndrome: Secondary | ICD-10-CM | POA: Diagnosis present

## 2024-04-01 DIAGNOSIS — E119 Type 2 diabetes mellitus without complications: Secondary | ICD-10-CM | POA: Diagnosis present

## 2024-04-01 DIAGNOSIS — D62 Acute posthemorrhagic anemia: Secondary | ICD-10-CM | POA: Diagnosis not present

## 2024-04-01 LAB — CBC
HCT: 33.6 % — ABNORMAL LOW (ref 36.0–46.0)
Hemoglobin: 10.7 g/dL — ABNORMAL LOW (ref 12.0–15.0)
MCH: 25.7 pg — ABNORMAL LOW (ref 26.0–34.0)
MCHC: 31.8 g/dL (ref 30.0–36.0)
MCV: 80.8 fL (ref 80.0–100.0)
Platelets: 289 K/uL (ref 150–400)
RBC: 4.16 MIL/uL (ref 3.87–5.11)
RDW: 17.8 % — ABNORMAL HIGH (ref 11.5–15.5)
WBC: 10.6 K/uL — ABNORMAL HIGH (ref 4.0–10.5)
nRBC: 0 % (ref 0.0–0.2)

## 2024-04-01 LAB — COMPREHENSIVE METABOLIC PANEL WITH GFR
ALT: 9 U/L (ref 0–44)
AST: 22 U/L (ref 15–41)
Albumin: 3.8 g/dL (ref 3.5–5.0)
Alkaline Phosphatase: 93 U/L (ref 38–126)
Anion gap: 15 (ref 5–15)
BUN: 10 mg/dL (ref 6–20)
CO2: 19 mmol/L — ABNORMAL LOW (ref 22–32)
Calcium: 9.1 mg/dL (ref 8.9–10.3)
Chloride: 102 mmol/L (ref 98–111)
Creatinine, Ser: 0.49 mg/dL (ref 0.44–1.00)
GFR, Estimated: >60 mL/min
Glucose, Bld: 147 mg/dL — ABNORMAL HIGH (ref 70–99)
Potassium: 3.9 mmol/L (ref 3.5–5.1)
Sodium: 136 mmol/L (ref 135–145)
Total Bilirubin: 0.2 mg/dL (ref 0.0–1.2)
Total Protein: 6.7 g/dL (ref 6.5–8.1)

## 2024-04-01 LAB — PROTEIN / CREATININE RATIO, URINE
Creatinine, Urine: 116 mg/dL
Protein Creatinine Ratio: 0.2 mg/mg — ABNORMAL HIGH
Total Protein, Urine: 26 mg/dL

## 2024-04-01 LAB — TYPE AND SCREEN
ABO/RH(D): A POS
Antibody Screen: NEGATIVE

## 2024-04-01 LAB — SYPHILIS: RPR W/REFLEX TO RPR TITER AND TREPONEMAL ANTIBODIES, TRADITIONAL SCREENING AND DIAGNOSIS ALGORITHM: RPR Ser Ql: NONREACTIVE

## 2024-04-01 LAB — GLUCOSE, CAPILLARY: Glucose-Capillary: 96 mg/dL (ref 70–99)

## 2024-04-01 MED ORDER — MISOPROSTOL 25 MCG QUARTER TABLET
25.0000 ug | ORAL_TABLET | ORAL | Status: DC | PRN
  Administered 2024-04-01 – 2024-04-02 (×2): 25 ug via VAGINAL
  Filled 2024-04-01 (×3): qty 1

## 2024-04-01 MED ORDER — FLEET ENEMA RE ENEM: 1.0000 | ENEMA | RECTAL | Status: DC | PRN

## 2024-04-01 MED ORDER — ACETAMINOPHEN 325 MG PO TABS
650.0000 mg | ORAL_TABLET | ORAL | Status: DC | PRN
  Administered 2024-04-03 (×2): 650 mg via ORAL
  Filled 2024-04-01 (×2): qty 2

## 2024-04-01 MED ORDER — LIDOCAINE HCL (PF) 1 % IJ SOLN: 30.0000 mL | INTRAMUSCULAR | Status: DC | PRN

## 2024-04-01 MED ORDER — OXYTOCIN-SODIUM CHLORIDE 30-0.9 UT/500ML-% IV SOLN
2.5000 [IU]/h | INTRAVENOUS | Status: DC
  Filled 2024-04-01: qty 500

## 2024-04-01 MED ORDER — METFORMIN HCL ER 500 MG PO TB24
500.0000 mg | ORAL_TABLET | Freq: Two times a day (BID) | ORAL | Status: AC
  Administered 2024-04-01 – 2024-04-06 (×9): 500 mg via ORAL
  Filled 2024-04-01 (×12): qty 1

## 2024-04-01 MED ORDER — ONDANSETRON HCL 4 MG/2ML IJ SOLN: 4.0000 mg | Freq: Four times a day (QID) | INTRAMUSCULAR | Status: DC | PRN

## 2024-04-01 MED ORDER — HYDROXYZINE HCL 50 MG PO TABS: 50.0000 mg | ORAL_TABLET | Freq: Four times a day (QID) | ORAL | Status: DC | PRN

## 2024-04-01 MED ORDER — OXYTOCIN BOLUS FROM INFUSION: 333.0000 mL | Freq: Once | INTRAVENOUS | Status: DC

## 2024-04-01 MED ORDER — OXYCODONE-ACETAMINOPHEN 5-325 MG PO TABS: 1.0000 | ORAL_TABLET | ORAL | Status: DC | PRN

## 2024-04-01 MED ORDER — LEVETIRACETAM 500 MG PO TABS
500.0000 mg | ORAL_TABLET | Freq: Two times a day (BID) | ORAL | Status: AC
  Administered 2024-04-02 (×2): 500 mg via ORAL
  Filled 2024-04-01 (×14): qty 1

## 2024-04-01 MED ORDER — LACTATED RINGERS IV SOLN: INTRAVENOUS | Status: DC

## 2024-04-01 MED ORDER — MISOPROSTOL 50MCG HALF TABLET
50.0000 ug | ORAL_TABLET | Freq: Once | ORAL | Status: AC
  Administered 2024-04-01: 50 ug via ORAL
  Filled 2024-04-01: qty 1

## 2024-04-01 MED ORDER — OXYCODONE-ACETAMINOPHEN 5-325 MG PO TABS: 2.0000 | ORAL_TABLET | ORAL | Status: DC | PRN

## 2024-04-01 MED ORDER — TERBUTALINE SULFATE 1 MG/ML IJ SOLN: 0.2500 mg | Freq: Once | INTRAMUSCULAR | Status: DC | PRN

## 2024-04-01 MED ORDER — MISOPROSTOL 25 MCG QUARTER TABLET
25.0000 ug | ORAL_TABLET | Freq: Once | ORAL | Status: AC
  Administered 2024-04-01: 25 ug via VAGINAL
  Filled 2024-04-01: qty 1

## 2024-04-01 MED ORDER — LACTATED RINGERS IV SOLN: 500.0000 mL | INTRAVENOUS | Status: DC | PRN

## 2024-04-01 MED ORDER — LABETALOL HCL 200 MG PO TABS
200.0000 mg | ORAL_TABLET | Freq: Two times a day (BID) | ORAL | Status: AC
  Administered 2024-04-01 – 2024-04-07 (×10): 200 mg via ORAL
  Filled 2024-04-01 (×12): qty 1

## 2024-04-01 MED ORDER — SOD CITRATE-CITRIC ACID 500-334 MG/5ML PO SOLN
30.0000 mL | ORAL | Status: DC | PRN
  Filled 2024-04-01: qty 30

## 2024-04-01 MED ORDER — FENTANYL CITRATE (PF) 100 MCG/2ML IJ SOLN: 100.0000 ug | INTRAMUSCULAR | Status: DC | PRN

## 2024-04-01 NOTE — Progress Notes (Signed)
 Patient ID: Monique Forbes, female   DOB: Oct 20, 1990, 34 y.o.   MRN: [id number]  Introductions exchanged. Patient just received Cytotec  at 2000. Encouraged patient to rest as she can. Plan to reassess at midnight.   BP 129/64   Pulse 95   Temp (!) 97.5 F (36.4 C) (Oral)   Resp 18   LMP 02/18/2023 (Approximate)   FHT: Cat I with irregular contractions at this time.   Orlandis Sanden Erven) Emilio, MSN, CNM  Center [facility]  04/01/2024 11:19 PM

## 2024-04-01 NOTE — Progress Notes (Addendum)
 Labor Progress Note Monique Forbes is a 34 y.o. H2E8948 at [redacted]w[redacted]d presented for IOL for cHTN and DM2. She received her prenatal care at [facility] . Patient is currently taking labetalol  200mg  BID and metformin  1000mg  BID. Patient states she is not taking the glyburide  2.5mg  at bedtime. 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 History of 10lb vaginal delivery.   S:  Comfortable resting in bed  O:  BP 131/70   Pulse (!) 109   LMP 02/18/2023 (Approximate)   EFM: baseline 140 bpm/ moderate variability/ 15x15 accels/ no decels  Toco/IUPC: q 2-8 mins SVE: Dilation: Closed Effacement (%): Thick Station: -4 (Ballotable) Presentation: Vertex Exam by:: Durel CNM Student   A/P: 34 y.o. H2E8948 [redacted]w[redacted]d  1. Labor: Oral cytotec . Consider FB at next exam. 2. FWB: Cat 1 3. Pain: per patient preference 4. gHTN: normal range last 4 hours 5. GDM: Q2h CBG: 96   Anticipate SVB.  Stormy VEAR Durel, Student-MidWife 3:10 PM    I was consulted, reviewed results and agree w/ POC. Consider Endotool if CBGs elevated in labor. Pt requests to stay on Labetalol  after delivery instead of switching to a daily med.   Claudene, Sherae Santino , CNM 04/01/2024 4:04 PM
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
## 2024-04-02 ENCOUNTER — Other Ambulatory Visit

## 2024-04-02 ENCOUNTER — Ambulatory Visit

## 2024-04-02 LAB — GLUCOSE, CAPILLARY: Glucose-Capillary: 132 mg/dL — ABNORMAL HIGH (ref 70–99)

## 2024-04-02 MED ORDER — FENTANYL-BUPIVACAINE-NACL 0.5-0.125-0.9 MG/250ML-% EP SOLN
12.0000 mL/h | EPIDURAL | Status: DC | PRN
  Administered 2024-04-03: 12 mL/h via EPIDURAL
  Filled 2024-04-02: qty 250

## 2024-04-02 MED ORDER — MISOPROSTOL 50MCG HALF TABLET
50.0000 ug | ORAL_TABLET | ORAL | Status: DC | PRN
  Administered 2024-04-02 – 2024-04-03 (×2): 50 ug via ORAL
  Filled 2024-04-02 (×2): qty 1

## 2024-04-02 MED ORDER — PHENYLEPHRINE 80 MCG/ML (10ML) SYRINGE FOR IV PUSH (FOR BLOOD PRESSURE SUPPORT): 80.0000 ug | PREFILLED_SYRINGE | INTRAVENOUS | Status: DC | PRN

## 2024-04-02 MED ORDER — MISOPROSTOL 25 MCG QUARTER TABLET
25.0000 ug | ORAL_TABLET | ORAL | Status: DC
  Administered 2024-04-02 (×3): 25 ug via VAGINAL
  Filled 2024-04-02 (×3): qty 1

## 2024-04-02 MED ORDER — EPHEDRINE 5 MG/ML INJ: 10.0000 mg | INTRAVENOUS | Status: DC | PRN

## 2024-04-02 MED ORDER — DIPHENHYDRAMINE HCL 50 MG/ML IJ SOLN: 12.5000 mg | INTRAMUSCULAR | Status: DC | PRN

## 2024-04-02 MED ORDER — LACTATED RINGERS IV SOLN: 500.0000 mL | Freq: Once | INTRAVENOUS | Status: DC

## 2024-04-02 NOTE — Progress Notes (Signed)
 Patient ID: Monique Forbes, female   DOB: 1990/10/14, 34 y.o.   MRN: [id number]  Blood pressure 126/70, pulse 88, temperature 98.1 F (36.7 C), temperature source Oral, resp. rate 18, last menstrual period 02/18/2023.  FHT: baseline 150/moderate variability/+accels/-decels Toco: Irritability  Dilation: 1.5 Effacement (%): 30 Cervical Position: Anterior Station: -4 (Ballotable) Presentation: Vertex Exam by:: Jomarie, MD  Seen at bedside to meet patient. SVE showing progress from prior. Discussed foley balloon for a second attempt, patient amenable. Foley balloon placed and filled with 50mL LR. Will also place 25mcg vaginal cytotec . All questions answered.   Charlie DELENA Jomarie, MD

## 2024-04-02 NOTE — Progress Notes (Signed)
 See CBG flowsheet for additional blood glucose.

## 2024-04-02 NOTE — Progress Notes (Signed)
 Labor Progress Note Monique Forbes is a 34 y.o. H2E8948 at [redacted]w[redacted]d presented for IOL for cHTN and DM2 S: feeling well. Not feeling a lot of contractions  O:  BP 126/68   Pulse 87   Temp (!) 97.5 F (36.4 C) (Oral)   Resp 18   LMP 02/18/2023 (Approximate)  EFM: 140bpm/moderate variability/+ accels / - decels  CVE: Dilation: Fingertip Effacement (%): Thick Station: -4 Surveyor, Minerals) Presentation: Vertex Exam by:: Greig Meyers, RN   A&P: 34 y.o. [phone] [redacted]w[redacted]d here for IOL 2/2 cHTN and DM2 #Labor:  S/p 3 doses of cytotec  - Additional cytotec  dose - Next check in 4 hours or sooner if clinically indicated - Can attempt folley balloon placement next check if appropriate #Pain: per patient request #FWB: Category I tracing #GBS negative  #gHTN Normal range last 4 hours - Watch BP - continue labetolol postpartum  #GDM Q2H CBG, last one slighly elevated after apple juice - encourage H20 consumption   Garen SHAUNNA Puffer, MD 1:33 AM

## 2024-04-02 NOTE — Progress Notes (Signed)
 LABOR PROGRESS NOTE Pt rechecked at 0900 Patient comfortable without epidural. SCE: closed, position on anterior wall  FHT: baseline 140, mod variability, +accels, -decels; overall category I Toco: rare  A/P:  Vaginal Cytotec  placed  [employer], DO 12:30 PM

## 2024-04-02 NOTE — Progress Notes (Signed)
 LABOR PROGRESS NOTE Pt rechecked at 1515 Patient comfortable without epidural. SCE: 1/50/high. Cervix anterior with curve in it, failed FB placement  FHT: baseline 140, mod variability, +accels, -decels; overall category I Toco: q3 min  A/P:  Vaginal Cytotec  placed FB next check  Barabara Maier, DO 3:16 PM

## 2024-04-03 ENCOUNTER — Inpatient Hospital Stay (HOSPITAL_COMMUNITY): Admitting: Anesthesiology

## 2024-04-03 ENCOUNTER — Encounter (HOSPITAL_COMMUNITY): Payer: Self-pay | Admitting: Obstetrics & Gynecology

## 2024-04-03 LAB — GLUCOSE, CAPILLARY
Glucose-Capillary: 94 mg/dL (ref 70–99)
Glucose-Capillary: 98 mg/dL (ref 70–99)

## 2024-04-03 LAB — CBC
HCT: 33.7 % — ABNORMAL LOW (ref 36.0–46.0)
Hemoglobin: 10.7 g/dL — ABNORMAL LOW (ref 12.0–15.0)
MCH: 25.7 pg — ABNORMAL LOW (ref 26.0–34.0)
MCHC: 31.8 g/dL (ref 30.0–36.0)
MCV: 81 fL (ref 80.0–100.0)
Platelets: 249 K/uL (ref 150–400)
RBC: 4.16 MIL/uL (ref 3.87–5.11)
RDW: 18 % — ABNORMAL HIGH (ref 11.5–15.5)
WBC: 12.4 K/uL — ABNORMAL HIGH (ref 4.0–10.5)
nRBC: 0 % (ref 0.0–0.2)

## 2024-04-03 MED ORDER — TERBUTALINE SULFATE 1 MG/ML IJ SOLN: 0.2500 mg | Freq: Once | INTRAMUSCULAR | Status: DC | PRN

## 2024-04-03 MED ORDER — OXYTOCIN-SODIUM CHLORIDE 30-0.9 UT/500ML-% IV SOLN
1.0000 m[IU]/min | INTRAVENOUS | Status: DC
  Administered 2024-04-03: 2 m[IU]/min via INTRAVENOUS

## 2024-04-03 MED ORDER — LIDOCAINE HCL (PF) 1 % IJ SOLN
INTRAMUSCULAR | Status: DC | PRN
  Administered 2024-04-03: 11 mL via EPIDURAL

## 2024-04-03 NOTE — Progress Notes (Signed)
 LABOR PROGRESS NOTE Pt rechecked at 1400 Patient comfortable with epidural. Pit at 10. SCE: 4/70/-2 to -3 with head well applied and tense bag AROM cf IUPC placed  FHT: baseline 150, mod variability, +accels, late decel x1 small at 1329; overall category II Toco: q2 min  A/P:  Continue to titrate pitocin  per protocol  [facility], DO 2:00 PM

## 2024-04-03 NOTE — Anesthesia Procedure Notes (Signed)
 Epidural Patient location during procedure: OB Start time: 04/03/2024 5:06 AM End time: 04/03/2024 5:20 AM  Staffing Anesthesiologist: Cleotilde Butler Dade, MD Performed: anesthesiologist   Preanesthetic Checklist Completed: patient identified, IV checked, site marked, risks and benefits discussed, surgical consent, monitors and equipment checked, pre-op evaluation and timeout performed  Epidural Patient position: sitting Prep: ChloraPrep Patient monitoring: heart rate, cardiac monitor, continuous pulse ox and blood pressure Approach: midline Location: L2-L3 Injection technique: LOR saline  Needle:  Needle type: Tuohy  Needle gauge: 17 G Needle length: 9 cm Needle insertion depth: 6 cm Catheter type: closed end flexible Catheter size: 20 Guage Catheter at skin depth: 10 cm Test dose: negative  Assessment Events: blood not aspirated, injection not painful, no injection resistance, no paresthesia and negative IV test  Additional Notes Reason for block:procedure for pain

## 2024-04-03 NOTE — Progress Notes (Signed)
 Patient ID: Monique Forbes, female   DOB: 12/20/1990, 34 y.o.   MRN: [id number]  Blood pressure 132/76, pulse 85, temperature 97.7 F (36.5 C), temperature source Oral, resp. rate 20, last menstrual period 02/18/2023.  FHT: baseline 140/moderate variability/+accels/-decels Toco: Irritability  Dilation: 1.5 Effacement (%): 30 Cervical Position: Anterior Station: -4 (Ballotable) Presentation: Vertex Exam by:: Rollo Crane, RN  Patient seen at bedside, asleep. Balloon still in place. Vaginal misoprostol  last given at 11pm. Plan to give next dose when patient is awake.  Monique DELENA Courts, MD

## 2024-04-03 NOTE — Anesthesia Preprocedure Evaluation (Signed)
"                                    Anesthesia Evaluation  Patient identified by MRN, date of birth, ID band Patient awake    Reviewed: Allergy & Precautions, H&P , NPO status , Patient's Chart, lab work & pertinent test results  Airway Mallampati: II  TM Distance: >3 FB Neck ROM: Full    Dental no notable dental hx.    Pulmonary neg pulmonary ROS, former smoker   Pulmonary exam normal breath sounds clear to auscultation       Cardiovascular hypertension, negative cardio ROS Normal cardiovascular exam Rhythm:Regular Rate:Normal     Neuro/Psych Seizures -,   negative psych ROS   GI/Hepatic negative GI ROS, Neg liver ROS,,,  Endo/Other  negative endocrine ROSdiabetes    Renal/GU negative Renal ROS  negative genitourinary   Musculoskeletal negative musculoskeletal ROS (+)    Abdominal  (+) + obese  Peds negative pediatric ROS (+)  Hematology negative hematology ROS (+)   Anesthesia Other Findings   Reproductive/Obstetrics (+) Pregnancy                              Anesthesia Physical Anesthesia Plan  ASA: 2  Anesthesia Plan: Epidural   Post-op Pain Management:    Induction:   PONV Risk Score and Plan:   Airway Management Planned:   Additional Equipment:   Intra-op Plan:   Post-operative Plan:   Informed Consent:   Plan Discussed with:   Anesthesia Plan Comments:         Anesthesia Quick Evaluation  "

## 2024-04-03 NOTE — Progress Notes (Signed)
 LABOR PROGRESS NOTE Pt rechecked at 1600 Patient comfortable with epidural. Pit at 5. SCE: 4/60-70/-2, station has descended some. Responsive to scalp stim.  FHT: baseline 150, min to mod variability, +accels, late decels with maintained variability durinng; overall category II Toco: q3-4 min  The risks of cesarean section discussed with the patient included but were not limited to: bleeding which may require transfusion or reoperation; infection which may require antibiotics; injury to bowel, bladder, ureters or other surrounding organs; injury to the fetus; need for additional procedures including hysterectomy in the event of a life-threatening hemorrhage; placental abnormalities with subsequent pregnancies, incisional problems, thromboembolic phenomenon and other postoperative/anesthesia complications.   A/P:  Pit was halved after fetus had decreased variability and late decels after AROM. MVUs approx 110 per RN now.  Continue to monitor, increase pitocin  as able. Discussed CS as possible option if fetus continues to    [facility] [facility], DO 4:15 PM

## 2024-04-03 NOTE — Progress Notes (Signed)
 Pts husband states no cesarean section unless it means life or death

## 2024-04-03 NOTE — Progress Notes (Signed)
 Patient ID: Monique Forbes, female   DOB: 19-Jun-1990, 34 y.o.   MRN: [id number]  Blood pressure 127/71, pulse 79, temperature 98 F (36.7 C), temperature source Oral, resp. rate 18, last menstrual period 02/18/2023, SpO2 98%.  FHT: baseline 140/moderate variability/+accels/-decels IUPC: ctx q4-32min  Dilation: 4 Effacement (%): 70 Cervical Position: Mid-Position Station: -2 Presentation: Vertex Exam by:: Dr. Jomarie  Seen at bedside for SVE, cervical position has moved forward, otherwise unchanged. Ctx currently measuring at 170 MVUs per RN, will plan to continue increasing pitocin  until adequate. Discussed plan of care and answered questions.   Charlie DELENA Jomarie, MD

## 2024-04-04 ENCOUNTER — Encounter (HOSPITAL_COMMUNITY): Admission: RE | Disposition: A | Payer: Self-pay | Source: Home / Self Care | Attending: Obstetrics and Gynecology

## 2024-04-04 ENCOUNTER — Encounter (HOSPITAL_COMMUNITY): Payer: Self-pay | Admitting: Obstetrics & Gynecology

## 2024-04-04 LAB — CBC
HCT: 30.8 % — ABNORMAL LOW (ref 36.0–46.0)
HCT: 29.6 % — ABNORMAL LOW (ref 36.0–46.0)
Hemoglobin: 10 g/dL — ABNORMAL LOW (ref 12.0–15.0)
Hemoglobin: 9.6 g/dL — ABNORMAL LOW (ref 12.0–15.0)
MCH: 26.2 pg (ref 26.0–34.0)
MCH: 26.2 pg (ref 26.0–34.0)
MCHC: 32.5 g/dL (ref 30.0–36.0)
MCHC: 32.4 g/dL (ref 30.0–36.0)
MCV: 80.8 fL (ref 80.0–100.0)
MCV: 80.7 fL (ref 80.0–100.0)
Platelets: 230 10*3/uL (ref 150–400)
Platelets: 224 10*3/uL (ref 150–400)
RBC: 3.81 MIL/uL — ABNORMAL LOW (ref 3.87–5.11)
RBC: 3.67 MIL/uL — ABNORMAL LOW (ref 3.87–5.11)
RDW: 17.7 % — ABNORMAL HIGH (ref 11.5–15.5)
RDW: 17.8 % — ABNORMAL HIGH (ref 11.5–15.5)
WBC: 18 10*3/uL — ABNORMAL HIGH (ref 4.0–10.5)
WBC: 17.6 10*3/uL — ABNORMAL HIGH (ref 4.0–10.5)
nRBC: 0 % (ref 0.0–0.2)
nRBC: 0 % (ref 0.0–0.2)

## 2024-04-04 LAB — GLUCOSE, CAPILLARY: Glucose-Capillary: 113 mg/dL — ABNORMAL HIGH (ref 70–99)

## 2024-04-04 MED ORDER — TRANEXAMIC ACID-NACL 1000-0.7 MG/100ML-% IV SOLN: 1000.0000 mg | Freq: Once | INTRAVENOUS | Status: DC

## 2024-04-04 MED ORDER — DEXAMETHASONE SOD PHOSPHATE PF 10 MG/ML IJ SOLN
INTRAMUSCULAR | Status: DC | PRN
  Administered 2024-04-04: 10 mg via INTRAVENOUS

## 2024-04-04 MED ORDER — GABAPENTIN 300 MG PO CAPS
300.0000 mg | ORAL_CAPSULE | Freq: Two times a day (BID) | ORAL | Status: DC
  Administered 2024-04-04 – 2024-04-07 (×7): 300 mg via ORAL
  Filled 2024-04-04 (×7): qty 1

## 2024-04-04 MED ORDER — TRANEXAMIC ACID-NACL 1000-0.7 MG/100ML-% IV SOLN
INTRAVENOUS | Status: DC | PRN
  Administered 2024-04-04: 1000 mg via INTRAVENOUS

## 2024-04-04 MED ORDER — FENTANYL CITRATE (PF) 100 MCG/2ML IJ SOLN: 25.0000 ug | INTRAMUSCULAR | Status: DC | PRN

## 2024-04-04 MED ORDER — SODIUM CHLORIDE 0.9 % IV SOLN
INTRAVENOUS | Status: AC
  Filled 2024-04-04: qty 5

## 2024-04-04 MED ORDER — CEFAZOLIN SODIUM-DEXTROSE 2-4 GM/100ML-% IV SOLN: 2.0000 g | INTRAVENOUS | Status: DC

## 2024-04-04 MED ORDER — OXYCODONE HCL 5 MG PO TABS
5.0000 mg | ORAL_TABLET | ORAL | Status: DC | PRN
  Administered 2024-04-06: 10 mg via ORAL
  Administered 2024-04-06: 5 mg via ORAL
  Filled 2024-04-04: qty 2
  Filled 2024-04-04: qty 1

## 2024-04-04 MED ORDER — ENOXAPARIN SODIUM 60 MG/0.6ML IJ SOSY
50.0000 mg | PREFILLED_SYRINGE | INTRAMUSCULAR | Status: DC
  Administered 2024-04-04 – 2024-04-06 (×3): 50 mg via SUBCUTANEOUS
  Filled 2024-04-04 (×3): qty 0.6

## 2024-04-04 MED ORDER — NALOXONE HCL 4 MG/10ML IJ SOLN: 1.0000 ug/kg/h | INTRAVENOUS | Status: DC | PRN

## 2024-04-04 MED ORDER — DIPHENHYDRAMINE HCL 25 MG PO CAPS: 25.0000 mg | ORAL_CAPSULE | ORAL | Status: DC | PRN

## 2024-04-04 MED ORDER — SODIUM CHLORIDE 0.9 % IR SOLN
Status: DC | PRN
  Administered 2024-04-04 (×2): 1

## 2024-04-04 MED ORDER — AZITHROMYCIN 500 MG PO TABS: 500.0000 mg | ORAL_TABLET | Freq: Once | ORAL | Status: DC

## 2024-04-04 MED ORDER — SENNOSIDES-DOCUSATE SODIUM 8.6-50 MG PO TABS
2.0000 | ORAL_TABLET | Freq: Every day | ORAL | Status: DC
  Administered 2024-04-05 – 2024-04-07 (×3): 2 via ORAL
  Filled 2024-04-04 (×3): qty 2

## 2024-04-04 MED ORDER — KETOROLAC TROMETHAMINE 30 MG/ML IJ SOLN: 30.0000 mg | Freq: Four times a day (QID) | INTRAMUSCULAR | Status: AC

## 2024-04-04 MED ORDER — CEFAZOLIN SODIUM-DEXTROSE 2-3 GM-%(50ML) IV SOLR
INTRAVENOUS | Status: DC | PRN
  Administered 2024-04-04: 2 g via INTRAVENOUS

## 2024-04-04 MED ORDER — DIPHENHYDRAMINE HCL 50 MG/ML IJ SOLN: 12.5000 mg | INTRAMUSCULAR | Status: DC | PRN

## 2024-04-04 MED ORDER — FENTANYL CITRATE (PF) 100 MCG/2ML IJ SOLN
INTRAMUSCULAR | Status: AC
  Filled 2024-04-04: qty 2

## 2024-04-04 MED ORDER — LACTATED RINGERS IV SOLN: INTRAVENOUS | Status: DC

## 2024-04-04 MED ORDER — MORPHINE SULFATE (PF) 0.5 MG/ML IJ SOLN
INTRAMUSCULAR | Status: DC | PRN
  Administered 2024-04-04: 3 mg via EPIDURAL

## 2024-04-04 MED ORDER — WITCH HAZEL-GLYCERIN EX PADS: 1.0000 | MEDICATED_PAD | CUTANEOUS | Status: DC | PRN

## 2024-04-04 MED ORDER — OXYCODONE HCL 5 MG PO TABS: 5.0000 mg | ORAL_TABLET | Freq: Once | ORAL | Status: DC | PRN

## 2024-04-04 MED ORDER — OXYCODONE HCL 5 MG/5ML PO SOLN: 5.0000 mg | Freq: Once | ORAL | Status: DC | PRN

## 2024-04-04 MED ORDER — DROPERIDOL 2.5 MG/ML IJ SOLN: 0.6250 mg | Freq: Once | INTRAMUSCULAR | Status: DC | PRN

## 2024-04-04 MED ORDER — IBUPROFEN 600 MG PO TABS
600.0000 mg | ORAL_TABLET | Freq: Four times a day (QID) | ORAL | Status: DC
  Administered 2024-04-05 – 2024-04-07 (×10): 600 mg via ORAL
  Filled 2024-04-04 (×10): qty 1

## 2024-04-04 MED ORDER — MENTHOL 3 MG MT LOZG: 1.0000 | LOZENGE | OROMUCOSAL | Status: DC | PRN

## 2024-04-04 MED ORDER — ONDANSETRON HCL 4 MG/2ML IJ SOLN
INTRAMUSCULAR | Status: AC
  Filled 2024-04-04: qty 2

## 2024-04-04 MED ORDER — ACETAMINOPHEN 500 MG PO TABS
1000.0000 mg | ORAL_TABLET | Freq: Four times a day (QID) | ORAL | Status: DC
  Administered 2024-04-04 – 2024-04-07 (×13): 1000 mg via ORAL
  Filled 2024-04-04 (×13): qty 2

## 2024-04-04 MED ORDER — OXYTOCIN-SODIUM CHLORIDE 30-0.9 UT/500ML-% IV SOLN: 2.5000 [IU]/h | INTRAVENOUS | Status: AC

## 2024-04-04 MED ORDER — MEASLES, MUMPS & RUBELLA VAC ~~LOC~~ SUSR: 0.5000 mL | Freq: Once | SUBCUTANEOUS | Status: DC

## 2024-04-04 MED ORDER — SCOPOLAMINE 1 MG/3DAYS TD PT72
1.0000 | MEDICATED_PATCH | TRANSDERMAL | Status: DC
  Administered 2024-04-04: 1 mg via TRANSDERMAL
  Filled 2024-04-04: qty 1

## 2024-04-04 MED ORDER — STERILE WATER FOR IRRIGATION IR SOLN
Status: DC | PRN
  Administered 2024-04-04: 1000 mL

## 2024-04-04 MED ORDER — ONDANSETRON HCL 4 MG/2ML IJ SOLN
INTRAMUSCULAR | Status: DC | PRN
  Administered 2024-04-04: 4 mg via INTRAVENOUS

## 2024-04-04 MED ORDER — MORPHINE SULFATE (PF) 0.5 MG/ML IJ SOLN
INTRAMUSCULAR | Status: AC
  Filled 2024-04-04: qty 10

## 2024-04-04 MED ORDER — COCONUT OIL OIL: 1.0000 | TOPICAL_OIL | Status: DC | PRN

## 2024-04-04 MED ORDER — PRENATAL MULTIVITAMIN CH
1.0000 | ORAL_TABLET | Freq: Every day | ORAL | Status: DC
  Administered 2024-04-05 – 2024-04-07 (×3): 1 via ORAL
  Filled 2024-04-04 (×3): qty 1

## 2024-04-04 MED ORDER — HYDROMORPHONE HCL 2 MG PO TABS: 2.0000 mg | ORAL_TABLET | ORAL | Status: DC | PRN

## 2024-04-04 MED ORDER — ZOLPIDEM TARTRATE 5 MG PO TABS: 5.0000 mg | ORAL_TABLET | Freq: Every evening | ORAL | Status: DC | PRN

## 2024-04-04 MED ORDER — DIPHENHYDRAMINE HCL 25 MG PO CAPS: 25.0000 mg | ORAL_CAPSULE | Freq: Four times a day (QID) | ORAL | Status: DC | PRN

## 2024-04-04 MED ORDER — DIBUCAINE (PERIANAL) 1 % EX OINT: 1.0000 | TOPICAL_OINTMENT | CUTANEOUS | Status: DC | PRN

## 2024-04-04 MED ORDER — OXYTOCIN-SODIUM CHLORIDE 30-0.9 UT/500ML-% IV SOLN
INTRAVENOUS | Status: DC | PRN
  Administered 2024-04-04: 30 mL via INTRAVENOUS

## 2024-04-04 MED ORDER — LIDOCAINE-EPINEPHRINE (PF) 2 %-1:200000 IJ SOLN
INTRAMUSCULAR | Status: DC | PRN
  Administered 2024-04-04: 10 mL via EPIDURAL

## 2024-04-04 MED ORDER — FENTANYL CITRATE (PF) 100 MCG/2ML IJ SOLN
INTRAMUSCULAR | Status: DC | PRN
  Administered 2024-04-04: 100 ug via EPIDURAL

## 2024-04-04 MED ORDER — KETOROLAC TROMETHAMINE 30 MG/ML IJ SOLN
30.0000 mg | Freq: Four times a day (QID) | INTRAMUSCULAR | Status: AC
  Administered 2024-04-04 – 2024-04-05 (×4): 30 mg via INTRAVENOUS
  Filled 2024-04-04 (×3): qty 1

## 2024-04-04 MED ORDER — SIMETHICONE 80 MG PO CHEW: 80.0000 mg | CHEWABLE_TABLET | ORAL | Status: DC | PRN

## 2024-04-04 MED ORDER — FERROUS SULFATE 325 (65 FE) MG PO TABS
325.0000 mg | ORAL_TABLET | ORAL | Status: DC
  Administered 2024-04-05 – 2024-04-07 (×2): 325 mg via ORAL
  Filled 2024-04-04 (×2): qty 1

## 2024-04-04 MED ORDER — SODIUM CHLORIDE 0.9% FLUSH: 3.0000 mL | INTRAVENOUS | Status: DC | PRN

## 2024-04-04 MED ORDER — SOD CITRATE-CITRIC ACID 500-334 MG/5ML PO SOLN
30.0000 mL | ORAL | Status: AC
  Administered 2024-04-04: 30 mL via ORAL

## 2024-04-04 MED ORDER — NALOXONE HCL 0.4 MG/ML IJ SOLN: 0.4000 mg | INTRAMUSCULAR | Status: DC | PRN

## 2024-04-04 MED ORDER — MAGNESIUM HYDROXIDE 400 MG/5ML PO SUSP: 30.0000 mL | ORAL | Status: DC | PRN

## 2024-04-04 MED ORDER — SODIUM CHLORIDE 0.9 % IV SOLN
500.0000 mg | Freq: Once | INTRAVENOUS | Status: AC
  Administered 2024-04-04: 500 mg via INTRAVENOUS

## 2024-04-04 MED ORDER — KETOROLAC TROMETHAMINE 30 MG/ML IJ SOLN
INTRAMUSCULAR | Status: AC
  Filled 2024-04-04: qty 1

## 2024-04-04 MED ORDER — ONDANSETRON HCL 4 MG/2ML IJ SOLN
4.0000 mg | Freq: Three times a day (TID) | INTRAMUSCULAR | Status: DC | PRN
  Administered 2024-04-04: 4 mg via INTRAVENOUS
  Filled 2024-04-04: qty 2

## 2024-04-04 NOTE — Lactation Note (Signed)
 This note was copied from a baby's chart. Lactation Consultation Note  Patient Name: Monique Forbes Unijb'd Date: 04/04/2024 Age:36 hours Reason for consult: Initial assessment;Early term 37-38.6wks;1st time breastfeeding  Mom post c/section. Hasn't put the baby to the breast yet. Mom stated she will wait because she doesn't have any milk yet. Discussed colostrum and be latching before giving formula. Mom stated later today when feeling better and has rested.  Mom stated she doesn't have a pump. Medicaid doesn't come into affect for 30 days. Suggested to wake baby every 3hrs for feeding if hasn't cued before then.  Mom would like to see Lactation later. Maternal Data Does the patient have breastfeeding experience prior to this delivery?: No  Feeding Nipple Type: Slow - flow  LATCH Score                    Lactation Tools Discussed/Used    Interventions Interventions: Education;LC Services brochure  Discharge Discharge Education: Outpatient recommendation Pump:  (doesn't have pump/ mom stated it will be 30 days before gets Medicaide)  Consult Status Consult Status: Follow-up Date: 04/04/24 Follow-up type: In-patient    Elisa Sorlie G 04/04/2024, 6:23 AM

## 2024-04-04 NOTE — Op Note (Signed)
 Operative Note   SURGERY DATE: 04/04/2024  PRE-OP DIAGNOSIS:  *Pregnancy at 37/5 *Failed IOL for DM2 *Arrest of dilation at 4-5cm with recurrent late decelerations *CHTN *AMA  POST-OP DIAGNOSIS: Same. Delivered. Nuchal cord x 2   PROCEDURE: primary low transverse cesarean section via Marty Schneider skin incision with double layer uterine closure  SURGEON: Surgeons and Role:    * Izell Harari, MD - Primary  ASSISTANT:    DEWAINE Jomarie Anwyn Kriegel DELENA, MD  An experienced assistant was required given the standard of surgical care given the complexity of the case.  This assistant was needed for exposure, dissection, suctioning, retraction, instrument exchange, assisting with delivery with administration of fundal pressure, and for overall help during the procedure.  ANESTHESIA: epidural  ESTIMATED BLOOD LOSS:  DRAINS: UOP via indwelling foley  TOTAL IV FLUIDS: crystalloid  VTE PROPHYLAXIS: SCDs to bilateral lower extremities  ANTIBIOTICS: Two grams of Cefazolin  were given., within 1 hour of skin incision and azithromycin  500mg  IV x 1  SPECIMENS: none  COMPLICATIONS: none  FINDINGS: No intra-abdominal adhesions were noted. Grossly normal uterus, tubes and ovaries. Clear amniotic fluid, cephalic (direct OP), nuchal cord x 2,  female infant, weight 3204gm, APGARs 9/9, intact placenta.  PROCEDURE IN DETAIL: The patient was taken to the operating room where anesthesia was administered and normal fetal heart tones were confirmed. She was then prepped and draped in the normal fashion in the dorsal supine position with a leftward tilt.  After a time out was performed, a Marty Schneider skin incision was made with the scalpel and carried through to the underlying layer of fascia. The fascia was then incised at the midline and this incision was extended bluntly, and the rectus muscles were then separated in the midline and the peritoneum was entered bluntly and separated. The bladder blade  was inserted and the vesicouterine peritoneum was identified.   A low transverse hysterotomy was made with the scalpel until the endometrial cavity was breached and the amniotic sac ruptured, yielding clear amniotic fluid. This incision was extended bluntly and the infants head, shoulders and body were delivered atraumatically.The cord was clamped x 2 and cut, and the infant was handed to the awaiting pediatricians, after delayed cord clamping was done.  The placenta was then gradually expressed from the uterus and then the uterus was exteriorized and cleared of all clots and debris. The hysterotomy was repaired with a running suture of 1-0 monocryl. A second imbricating layer of 1-0 monocryl suture was then placed to achieve excellent hemostasis.   The uterus and adnexa were then returned to the abdomen, and the hysterotomy and all operative sites were reinspected and excellent hemostasis was noted after irrigation and suction of the abdomen with warm saline.  The peritoneum was closed with a running stitch of 3-0 Vicryl. The fascia was reapproximated with 0 Vicryl in a simple running fashion bilaterally. The subcutaneous layer was then reapproximated with interrupted sutures of 2-0 plain gut, and the skin was then closed with 4-0 monocryl, in a subcuticular fashion.  The patient  tolerated the procedure well. Sponge, lap, needle, and instrument counts were correct x 2. The patient was transferred to the recovery room awake, alert and breathing independently in stable condition.  Harari Izell Overcast MD Attending Center for [facility])
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
## 2024-04-04 NOTE — Anesthesia Postprocedure Evaluation (Signed)
"   Anesthesia Post Note  Patient: Monique Forbes  Procedure(s) Performed: CESAREAN DELIVERY     Patient location during evaluation: L&D Anesthesia Type: Epidural Level of consciousness: awake and alert Pain management: pain level controlled Vital Signs Assessment: post-procedure vital signs reviewed and stable Respiratory status: spontaneous breathing, nonlabored ventilation and respiratory function stable Cardiovascular status: stable Postop Assessment: no headache, no backache and epidural receding Anesthetic complications: no   No notable events documented.  Last Vitals:  Vitals:   04/04/24 0510 04/04/24 0615  BP: (!) 104/59 110/65  Pulse: 76 78  Resp: 18 18  Temp: 37.2 C 36.9 C  SpO2: 96% 97%    Last Pain:  Vitals:   04/04/24 0617  TempSrc:   PainSc: 0-No pain   Pain Goal:                   [name]      "

## 2024-04-04 NOTE — Transfer of Care (Signed)
 Immediate Anesthesia Transfer of Care Note  Patient: Monique Forbes  Procedure(s) Performed: CESAREAN DELIVERY  Patient Location: PACU  Anesthesia Type:Epidural  Level of Consciousness: awake, alert , and oriented  Airway & Oxygen Therapy: Patient Spontanous Breathing  Post-op Assessment: Report given to RN and Post -op Vital signs reviewed and stable  Post vital signs: Reviewed and stable  Last Vitals:  Vitals Value Taken Time  BP 116/63 04/04/24 02:20  Temp 36.8 C 04/04/24 02:20  Pulse 78 04/04/24 02:28  Resp 17 04/04/24 02:28  SpO2 98 % 04/04/24 02:28  Vitals shown include unfiled device data.  Last Pain:  Vitals:   04/04/24 0220  TempSrc: Oral  PainSc:          Complications: No notable events documented.

## 2024-04-04 NOTE — Op Note (Signed)
 Entered in error

## 2024-04-05 DIAGNOSIS — D62 Acute posthemorrhagic anemia: Secondary | ICD-10-CM | POA: Diagnosis not present

## 2024-04-05 LAB — GLUCOSE, CAPILLARY: Glucose-Capillary: 138 mg/dL — ABNORMAL HIGH (ref 70–99)

## 2024-04-05 MED ORDER — POTASSIUM CHLORIDE CRYS ER 20 MEQ PO TBCR
20.0000 meq | EXTENDED_RELEASE_TABLET | Freq: Every day | ORAL | Status: DC
  Administered 2024-04-05 – 2024-04-07 (×3): 20 meq via ORAL
  Filled 2024-04-05 (×3): qty 1

## 2024-04-05 MED ORDER — FUROSEMIDE 20 MG PO TABS
20.0000 mg | ORAL_TABLET | Freq: Every day | ORAL | Status: DC
  Administered 2024-04-05 – 2024-04-07 (×3): 20 mg via ORAL
  Filled 2024-04-05 (×3): qty 1

## 2024-04-05 NOTE — Discharge Instructions (Signed)
 [facility] Mental Health Resources: What if I or someone I know is in crisis? If you are thinking about harming yourself or having thoughts of suicide, or if you know someone who is, seek help right away. Call your doctor or mental health care provider. Call 911 or go to a hospital emergency room to get immediate help, or ask a friend or family member to help you do these things. Call the [place]  National Suicide Prevention Lifeline's toll-free, 24-hour hotline at 988, or 1-800-273-TALK 802-297-4277) or TTY: (410)826-2550 TTY (951) 068-1870) to talk to a trained counselor. Have Support Come to You. Mobile Crisis Teams are available 24 hours a day in all counties. Professional counselors will speak with you and your family during a visit. They have an average response time of 2 hours. This service is provided by: [facility] [phone] If you are in crisis, make sure you are not left alone.  If someone else is in crisis, make sure he or she is not left alone   [facility] Address: [address] [place], [place] [address] Hours: Mon-Fri 8am-5pm Phone: [phone] Services: Crisis [facility] (Appointments are not needed), Mental Health Outpatient Treatment, Psychiatric Services/Medical Services, Substance Abuse Outpatient Treatment, Medication Assisted Treatment.  **24 Hour Crisis Hotline: 2605261702** *Accepts Medicaid, sliding scale for uninsured, and most insurance companies. Individuals with Medicaid have no obligation for a copay.    [facility] Address: [address], [address] [place], [place] [address] Hours: Mon-Thurs: 8:30am-5pm, Fri: 8:30am-1pm Phone: [phone] *Accepts Medicaid, most insurance companies, financial assistance program.

## 2024-04-05 NOTE — Plan of Care (Signed)
   Problem: Education: Goal: Knowledge of General Education information will improve Description: Including pain rating scale, medication(s)/side effects and non-pharmacologic comfort measures Outcome: Completed/Met
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
## 2024-04-05 NOTE — Progress Notes (Signed)
 Daily Post Partum Note  04/05/2024 Monique Forbes is a 34 y.o. [id number] POD#1 s/p PLTCS for arrest of dilation and recurrent late decels at [redacted]w[redacted]d.  Pregnancy c/b CHTN, DM2, seizure disorder.  24hr/overnight events:  none  Subjective:  +flatus, ambulation, taking PO, voiding; meeting postpartum lochia.   Objective:    Current Vital Signs 24h Vital Sign Ranges  T 98 F (36.7 C) Temp  Avg: 98.1 F (36.7 C)  Min: 97.7 F (36.5 C)  Max: 98.4 F (36.9 C)  BP 115/72 BP  Min: 103/60  Max: 115/72  HR 78 Pulse  Avg: 78.2  Min: 70  Max: 93  RR 20 Resp  Avg: 18.8  Min: 18  Max: 20  SaO2 98 % Room Air SpO2  Avg: 97.8 %  Min: 97 %  Max: 98 %       24 Hour I/O Current Shift I/O  Time Ins Outs 01/24 0701 - 01/25 0700 In: 480 [P.O.:480] Out: 1000 [Urine:1000] No intake/output data recorded.    General: NAD Abdomen: soft, nttp, obese. Firm fundus. Incision with c/d/I dressing Perineum: deferred Skin:  Warm and dry.  Cardiovascular: S1, S2 normal, no murmur, rub or gallop, regular rate and rhythm Respiratory:  Clear to auscultation bilateral. Normal respiratory effort Extremities: +1 to +2 hands and LEs  Medications Current Facility-Administered Medications  Medication Dose Route Frequency Provider Last Rate Last Admin   acetaminophen  (TYLENOL ) tablet 1,000 mg  1,000 mg Oral Q6H Jomarie, Khanh Cordner A, MD   1,000 mg at 04/05/24 9380   coconut oil  1 Application Topical PRN Jomarie Tamre Cass LABOR, MD       witch hazel-glycerin  (TUCKS) pad 1 Application  1 Application Topical PRN Jomarie Tyasia Packard LABOR, MD       And   dibucaine (NUPERCAINAL) 1 % rectal ointment 1 Application  1 Application Rectal PRN Jomarie Jadalyn Oliveri LABOR, MD       diphenhydrAMINE  (BENADRYL ) injection 12.5 mg  12.5 mg Intravenous Q4H PRN Boone Fess, MD       Or   diphenhydrAMINE  (BENADRYL ) capsule 25 mg  25 mg Oral Q4H PRN Boone Fess, MD       diphenhydrAMINE  (BENADRYL ) capsule 25 mg  25 mg Oral Q6H PRN Jomarie Earlee Herald LABOR, MD        enoxaparin  (LOVENOX ) injection 50 mg  50 mg Subcutaneous Q24H Jomarie Dekayla Prestridge A, MD   50 mg at 04/04/24 2236   ferrous sulfate  tablet 325 mg  325 mg Oral QODAY Shabazz, Tekiyah A, MD       gabapentin  (NEURONTIN ) capsule 300 mg  300 mg Oral BID Jomarie Vinette Crites A, MD   300 mg at 04/05/24 0852   HYDROmorphone  (DILAUDID ) tablet 2 mg  2 mg Oral Q4H PRN Jomarie Ernie Sagrero LABOR, MD       ketorolac  (TORADOL ) 30 MG/ML injection 30 mg  30 mg Intravenous Q6H Shabazz, Tekiyah A, MD       Followed by   ibuprofen  (ADVIL ) tablet 600 mg  600 mg Oral Q6H Jomarie Kiyonna Tortorelli A, MD   600 mg at 04/05/24 0619   labetalol  (NORMODYNE ) tablet 200 mg  200 mg Oral BID Shabazz, Tekiyah A, MD   200 mg at 04/05/24 9147   levETIRAcetam  (KEPPRA ) tablet 500 mg  500 mg Oral BID Jomarie Lauris Keepers A, MD   500 mg at 04/02/24 0851   magnesium  hydroxide (MILK OF MAGNESIA) suspension 30 mL  30 mL Oral Q3 days PRN Jomarie Zaylynn Rickett LABOR, MD  measles, mumps & rubella vaccine (PRIORIX) injection 0.5 mL  0.5 mL Subcutaneous Once Jomarie Campi A, MD       menthol  (CEPACOL) lozenge 3 mg  1 lozenge Oral Q2H PRN Jomarie Campi LABOR, MD       metFORMIN  (GLUCOPHAGE -XR) 24 hr tablet 500 mg  500 mg Oral BID Shabazz, Tekiyah A, MD   500 mg at 04/05/24 9147   naloxone  (NARCAN ) injection 0.4 mg  0.4 mg Intravenous PRN Boone Fess, MD       And   sodium chloride  flush (NS) 0.9 % injection 3 mL  3 mL Intravenous PRN Boone Fess, MD       naloxone  HCl (NARCAN ) 2 mg in dextrose  5 % 250 mL infusion  1-2 mcg/kg/hr Intravenous Continuous PRN Boone Fess, MD       ondansetron  (ZOFRAN ) injection 4 mg  4 mg Intravenous Q8H PRN Boone Fess, MD   4 mg at 04/04/24 1033   oxyCODONE  (Oxy IR/ROXICODONE ) immediate release tablet 5-10 mg  5-10 mg Oral Q4H PRN Jomarie Campi LABOR, MD       prenatal multivitamin tablet 1 tablet  1 tablet Oral Q1200 Jomarie Campi A, MD       scopolamine  (TRANSDERM-SCOP) 1 MG/3DAYS 1 mg  1 patch Transdermal Q72H Izell Harari, MD   1  mg at 04/04/24 1325   senna-docusate (Senokot-S) tablet 2 tablet  2 tablet Oral Daily Jomarie Campi LABOR, MD       simethicone  (MYLICON) chewable tablet 80 mg  80 mg Oral PRN Jomarie Campi LABOR, MD        Labs:  Recent Labs  Lab 04/03/24 0124 04/04/24 0305 04/04/24 0423  WBC 12.4* 18.0* 17.6*  HGB 10.7* 10.0* 9.6*  HCT 33.7* 30.8* 29.6*  PLT 249 230 224   Recent Labs  Lab 04/01/24 0806  NA 136  K 3.9  CL 102  CO2 19*  BUN 10  CREATININE 0.49  GLUCOSE 147*  CALCIUM 9.1    Assessment & Plan:  Patient doing well *Postpartum/postop: routine care. PO iron . 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 She's amenable to 5day lasix  and kdur course *DM2: has dexcom. Order to correlate CBGs with dexcom. CBG 0230 113 yesterday. Continue metformin  *Seizure d/o: followed by [facility], ?stress related. Continue bid keppra  *Dispo: likely tomorrow.   Harari Izell Overcast MD Attending Center for [facility])

## 2024-04-06 ENCOUNTER — Encounter: Admitting: Obstetrics & Gynecology

## 2024-04-06 ENCOUNTER — Other Ambulatory Visit: Admitting: Radiology

## 2024-04-06 ENCOUNTER — Other Ambulatory Visit (HOSPITAL_COMMUNITY): Payer: Self-pay

## 2024-04-06 ENCOUNTER — Encounter: Payer: Self-pay | Admitting: Obstetrics and Gynecology

## 2024-04-06 DIAGNOSIS — Z98891 History of uterine scar from previous surgery: Principal | ICD-10-CM

## 2024-04-06 LAB — GLUCOSE, CAPILLARY
Glucose-Capillary: 112 mg/dL — ABNORMAL HIGH (ref 70–99)
Glucose-Capillary: 117 mg/dL — ABNORMAL HIGH (ref 70–99)

## 2024-04-06 MED ORDER — IBUPROFEN 600 MG PO TABS
600.0000 mg | ORAL_TABLET | Freq: Four times a day (QID) | ORAL | 0 refills | Status: AC
  Filled 2024-04-06: qty 90, 23d supply, fill #0

## 2024-04-06 MED ORDER — OXYCODONE HCL 5 MG PO TABS
5.0000 mg | ORAL_TABLET | ORAL | 0 refills | Status: AC | PRN
  Filled 2024-04-06: qty 20, 2d supply, fill #0

## 2024-04-06 MED ORDER — ACETAMINOPHEN 500 MG PO TABS
1000.0000 mg | ORAL_TABLET | Freq: Four times a day (QID) | ORAL | 0 refills | Status: AC
  Filled 2024-04-06: qty 90, 12d supply, fill #0

## 2024-04-06 MED ORDER — COCONUT OIL OIL: 1.0000 | TOPICAL_OIL | Status: AC | PRN

## 2024-04-06 MED ORDER — DIBUCAINE (PERIANAL) 1 % EX OINT: 1.0000 | TOPICAL_OINTMENT | CUTANEOUS | Status: AC | PRN

## 2024-04-06 MED ORDER — POTASSIUM CHLORIDE CRYS ER 20 MEQ PO TBCR
20.0000 meq | EXTENDED_RELEASE_TABLET | Freq: Every day | ORAL | 0 refills | Status: AC
  Filled 2024-04-06: qty 5, 5d supply, fill #0

## 2024-04-06 MED ORDER — FUROSEMIDE 40 MG PO TABS
20.0000 mg | ORAL_TABLET | Freq: Every day | ORAL | 0 refills | Status: AC
  Filled 2024-04-06: qty 3, 6d supply, fill #0

## 2024-04-06 MED ORDER — LABETALOL HCL 200 MG PO TABS
200.0000 mg | ORAL_TABLET | Freq: Two times a day (BID) | ORAL | 3 refills | Status: AC
  Filled 2024-04-06: qty 60, 30d supply, fill #0

## 2024-04-06 MED ORDER — WITCH HAZEL-GLYCERIN EX PADS
1.0000 | MEDICATED_PAD | CUTANEOUS | 12 refills | Status: AC | PRN
  Filled 2024-04-06: qty 40, fill #0

## 2024-04-06 MED ORDER — SENNOSIDES-DOCUSATE SODIUM 8.6-50 MG PO TABS
2.0000 | ORAL_TABLET | Freq: Every day | ORAL | 0 refills | Status: AC
  Filled 2024-04-06: qty 60, 30d supply, fill #0

## 2024-04-06 NOTE — Progress Notes (Signed)
 The Rn called Jomarie MD regarding the patient's low blood sugar of 51. The hypoglycemic protocol was followed and the patient's blood sugars returned to 112 and 117.

## 2024-04-06 NOTE — Progress Notes (Signed)
 POD#2 Subjective:  Monique Forbes is a 34 y.o. [id number] [redacted]w[redacted]d s/p pLTCS.  No acute events overnight.  Pt denies problems with ambulating, voiding or po intake.  She denies nausea or vomiting.  Pain is well controlled.  She has had flatus.  Lochia Small.  Plan for birth control is unsure.  Method of Feeding: both  Objective: Blood pressure 125/75, pulse 72, temperature (!) 97.3 F (36.3 C), temperature source Oral, resp. rate 18, last menstrual period 02/18/2023, SpO2 99%, unknown if currently breastfeeding.  Physical Exam:  General: alert, cooperative and no distress Lochia:normal flow Chest: normal WOB Heart: Regular rate Abdomen: +BS, soft, mild TTP (appropriate). Incision is c/d/i Uterine Fundus: firm, under umbilicus DVT Evaluation: No evidence of DVT seen on physical exam. Extremities: moderate edema  Recent Labs    04/04/24 0305 04/04/24 0423  HGB 10.0* 9.6*  HCT 30.8* 29.6*    Assessment/Plan:  ASSESSMENT: Monique Forbes is a 34 y.o. [id number] [redacted]w[redacted]d s/p pLTCS  Plan for discharge tomorrow Continue routine PP care.Routine post operative care.  Breastfeeding support PRN  #T2DM- continue metformin  BID and glyburide . QID BG #cHTN with SIPE: Labetalol . Lasix  and K for prevention of fluid shifts #Anemia, acute post operative, expected and clinically significant: started Ferrous sulfate  every other day.  #Seizure disorder: Continue Keppra  BID   LOS: 5 days   Monique Forbes 04/06/2024, 10:54 AM

## 2024-04-06 NOTE — Social Work (Signed)
 CSW introduced role and reason for the visit to parents. CSW delivered the pack n play and MOB competed the Harmless Agreement form. MOB inquired about support with diapers and wipes. CSW provided MOB with resources for diapers and wipes.CSW inquired if MOB received WIC/SNAP benefits. MOB reported that she receives both WIC/SNAP benefits. CSW assessed for additional needs. MOB reported none and expressed appreciation for the support.   Nat Quiet, MSW, LCSW Clinical Social Worker  [phone] 11-01-24  9:52 AM

## 2024-04-06 NOTE — Lactation Note (Signed)
 This note was copied from a baby's chart. Lactation Consultation Note  Patient Name: Monique Forbes Unijb'd Date: 04/06/2024 Age:36 hours  Reason for consult: Follow-up assessment;Early term 37-38.6wks;Exclusive pumping and bottle feeding  P2, [redacted]w[redacted]d, 5% weight loss  Mother hopeful that she and baby will be discharged today. Stork pump was taken to mother's room and she signed the receipt.   Baby has been formula feeding. Mother has pumped 1-2 times but didn't get anything. Parents asking about how to use the new pump and referred mother and father to the Spectra  website. Discussed flange sizing and getting flange inserts to use inside the Spectra  24 mm flanges. Mother is aware of flange fittings. Informed to pump 8 times in 24 hrs to establish and maintain her milk production. (Every 3 hrs for 15 min). Mother verbalized understanding.   Mom made aware of O/P services, breastfeeding support groups, community resources, and phone # for post-discharge questions. Mother was informed that support is offered for mother's pumping and bottle feeding, as well.   Discussed prevention and management of engorgement. Mother denied any other questions or concerns.    Maternal Data Has patient been taught Hand Expression?: Yes  Feeding Mother's Current Feeding Choice: Breast Milk and Formula Nipple Type: Slow - flow  Interventions Interventions: Breast feeding basics reviewed;Education  Discharge Discharge Education: Engorgement and breast care;Warning signs for feeding baby  Consult Status Consult Status: Complete Date: 04/06/24    Joshua Rojelio HERO 04/06/2024, 1:05 PM

## 2024-04-06 NOTE — Discharge Summary (Signed)
 Postpartum Discharge Summary         ***update date of service                   Patient Name: Monique Forbes DOB: 1990-03-14 MRN: [id number]   Date of admission: 04/01/2024 Delivery date:04/04/2024 Delivering provider: IZELL HARARI Date of discharge: 04/06/2024   Admitting diagnosis: Chronic hypertension affecting pregnancy [O10.919] Intrauterine pregnancy: [redacted]w[redacted]d     Secondary diagnosis:  Principal Problem:   S/P primary low transverse C-section Active Problems:   Chronic hypertension affecting pregnancy   PCOS (polycystic ovarian syndrome)   Anxiety and depression   Type 2 diabetes mellitus (HCC)   Hyperthyroidism   Supervision of high risk pregnancy, antepartum   Postoperative anemia due to acute blood loss   Additional problems: None                           Discharge diagnosis: Term Pregnancy Delivered, CHTN with superimposed preeclampsia, and Type 2 DM                                              Post partum procedures:None Augmentation: AROM, Pitocin , Cytotec , and IP Foley Complications: None   Hospital course: Induction of Labor With Cesarean Section   34 y.o. yo [id number] at [redacted]w[redacted]d was admitted to the hospital 04/01/2024 for induction of labor. Patient had a labor course significant for late declerations. The patient went for cesarean section due to Arrest of Dilation. Delivery details are as follows: Membrane Rupture Time/Date: 1:54 PM,04/03/2024  Delivery Method:C-Section, Low Transverse Operative Delivery:N/A Details of operation can be found in separate operative Note.  Patient had a postpartum course complicated by nothing has had typical post operative recovery. She is ambulating, tolerating a regular diet, passing flatus, and urinating well.  Medications were sent to transition of care pharmacy on 04/06/24 and patient canceled discharge after these were sent. Patient is discharged home in stable condition on ***.       Newborn Data: Birth date:04/04/2024 Birth  time:1:09 AM Gender:Female Living status:Living Apgars:9 ,9  Weight:3204 g                                Magnesium  Sulfate received: No BMZ received: No Rhophylac:N/A MMR:N/A T-DaP:Given prenatally Flu: Yes RSV Vaccine received: No Transfusion:No   Immunizations received:     Immunization History  Administered Date(s) Administered   Influenza Inj Mdck Quad Pf 01/10/2022   Influenza Nasal 12/11/2015   Influenza, Quadrivalent, Recombinant, Inj, Pf 12/03/2021   Influenza, Seasonal, Injecte, Preservative Fre 12/13/2022, 01/13/2024   Influenza-Unspecified 12/14/2019, 11/30/2020   PFIZER(Purple Top)SARS-COV-2 Vaccination 10/15/2019, 11/05/2019   Tdap 01/17/2016, 03/03/2024      Physical exam        Vitals:    04/04/24 2055 04/05/24 0358 04/05/24 2048 04/06/24 0509  BP: 103/60 115/72 125/65 125/75  Pulse: 93 78 82 72  Resp: 20 20 18 18   Temp: 98.1 F (36.7 C) 98 F (36.7 C) (!) 97.3 F (36.3 C) (!) 97.3 F (36.3 C)  TempSrc: Oral Oral Oral Oral  SpO2: 98% 98% 97% 99%    General: alert, cooperative, and no distress Lochia: appropriate Uterine Fundus: firm Incision: Healing well with no significant drainage, No significant erythema, Dressing is clean, dry, and  intact DVT Evaluation: No evidence of DVT seen on physical exam. Labs: Recent Labs       Lab Results  Component Value Date    WBC 17.6 (H) 04/04/2024    HGB 9.6 (L) 04/04/2024    HCT 29.6 (L) 04/04/2024    MCV 80.7 04/04/2024    PLT 224 04/04/2024          Latest Ref Rng & Units 04/01/2024    8:06 AM  CMP  Glucose 70 - 99 mg/dL 852   BUN 6 - 20 mg/dL 10   Creatinine 9.55 - 1.00 mg/dL 9.50   Sodium 864 - 854 mmol/L 136   Potassium 3.5 - 5.1 mmol/L 3.9   Chloride 98 - 111 mmol/L 102   CO2 22 - 32 mmol/L 19   Calcium 8.9 - 10.3 mg/dL 9.1   Total Protein 6.5 - 8.1 g/dL 6.7   Total Bilirubin 0.0 - 1.2 mg/dL 0.2   Alkaline Phos 38 - 126 U/L 93   AST 15 - 41 U/L 22   ALT 0 - 44 U/L 9     Edinburgh  Score:     04/06/2024   11:10 AM  Edinburgh Postnatal Depression Scale Screening Tool  I have been able to laugh and see the funny side of things. 0  I have looked forward with enjoyment to things. 0  I have blamed myself unnecessarily when things went wrong. 0  I have been anxious or worried for no good reason. 0  I have felt scared or panicky for no good reason. 0  Things have been getting on top of me. 0  I have been so unhappy that I have had difficulty sleeping. 0  I have felt sad or miserable. 0  I have been so unhappy that I have been crying. 0  The thought of harming myself has occurred to me. 0  Edinburgh Postnatal Depression Scale Total 0    Edinburgh Postnatal Depression Scale Total: 0     After visit meds:  Allergies as of 04/06/2024         Reactions    Bee Venom Anaphylaxis    Cashew Nut Oil Hives    Flavoring Agent (non-screening) Anaphylaxis    Berry Flavor    Anacardium Occidentale Hives    Trazodone Other (See Comments)    seizures            Medication List       STOP taking these medications     IRON  PO    ondansetron  4 MG disintegrating tablet Commonly known as: ZOFRAN -ODT    valACYclovir  500 MG tablet Commonly known as: Valtrex            TAKE these medications     acetaminophen  500 MG tablet Commonly known as: TYLENOL  Take 2 tablets (1,000 mg total) by mouth every 6 (six) hours.    albuterol  108 (90 Base) MCG/ACT inhaler Commonly known as: VENTOLIN  HFA Inhale 1-2 puffs into the lungs every 6 (six) hours as needed for wheezing or shortness of breath.    coconut oil Oil Apply 1 Application topically as needed.    Dexcom G7 Sensor Misc 1 each by Does not apply route daily. Change sensor every 10 days    dibucaine 1 % Oint Commonly known as: NUPERCAINAL Place 1 Application rectally as needed for hemorrhoids.    furosemide  40 MG tablet Commonly known as: LASIX  Take 0.5 tablets (20 mg total) by mouth daily for 5 days Start taking  on: April 07, 2024    glyBURIDE  2.5 MG tablet Commonly known as: DIABETA  Take 1 tablet (2.5 mg total) by mouth at bedtime.    ibuprofen  600 MG tablet Commonly known as: ADVIL  Take 1 tablet (600 mg total) by mouth every 6 (six) hours.    labetalol  200 MG tablet Commonly known as: NORMODYNE  Take 1 tablet (200 mg total) by mouth 2 (two) times daily. What changed: medication strength    levETIRAcetam  500 MG tablet Commonly known as: Keppra  Take 1 tablet (500 mg total) by mouth 2 (two) times daily.    metFORMIN  500 MG tablet Commonly known as: GLUCOPHAGE  Take 2 tablets (1,000 mg total) by mouth 2 (two) times daily with a meal.    oxyCODONE  5 MG immediate release tablet Commonly known as: Oxy IR/ROXICODONE  Take 1-2 tablets (5-10 mg total) by mouth every 4 (four) hours as needed for moderate pain (pain score 4-6).    pantoprazole  40 MG tablet Commonly known as: Protonix  Take 1 tablet (40 mg total) by mouth 2 (two) times daily.    potassium chloride  SA 20 MEQ tablet Commonly known as: KLOR-CON  M Take 1 tablet (20 mEq total) by mouth daily. Start taking on: April 07, 2024    Prenatal Vitamin 27-0.8 MG Tabs Take 1 tablet by mouth daily.    senna-docusate 8.6-50 MG tablet Commonly known as: Senokot-S Take 2 tablets by mouth daily. Start taking on: April 07, 2024    witch hazel-glycerin  pad Commonly known as: TUCKS Apply 1 Application topically as needed for hemorrhoids.                        Discharge Care Instructions  (From admission, onward)                 Start     Ordered    04/06/24 0000   Discharge wound care:       Comments: In the shower, let soapy water  drip on the wound (dont scrub). Pat it dry. If your skin folds over the incision, put a cloth pad on it to keep it from getting sweaty. Within a week, your wound will be mostly healed. But look out for issues: If you develop a fever or if the skin surrounding the incision turns red, it starts  oozing green or pus-colored liquid, or it becomes hard or painful, call your doctor. These could be signs of infection.   You can remove the dressing completely in 7-10 days after your C-section. You may want to put another bandage on to protect your clothes   04/06/24 1206                    Discharge home in stable condition Infant Feeding: Bottle and Breast Infant Disposition:home with mother Discharge instruction: per After Visit Summary and Postpartum booklet. Activity: Advance as tolerated. Pelvic rest for 6 weeks.  Diet: carb modified diet Future Appointments:No future appointments. Follow up Visit:   Message sent to FT 04/04/24   Please schedule this patient for a In person postpartum visit in 6 weeks with the following provider: Any provider. 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

## 2024-04-06 NOTE — Lactation Note (Signed)
 This note was copied from a baby's chart. Lactation Consultation Note  Patient Name: Monique Forbes Unijb'd Date: 04/06/2024 Age:36 hours Reason for consult: Follow-up assessment;Late-preterm 34-36.6wks;Exclusive pumping and bottle feeding.  P1, MOB wants to pump only and formula feed infant for now. LC set MOB up with DEBP and MOB fitted with 21 breast flange for her left breast and 18 mm breast flange for her right breast. MOB was pumping as LC left the room. MOB will continue to pump every 3 hours for 15 minutes on the initial setting. MOB will offer infant any EBM first and then m20 kcal formula. Infant is now consuming 30 mls per feeding. MOB knows that her EBM is safe for 4 hours whereas formula RTF once open is safe for 1 hour. LC sent referral for [facility] DEBP.   Maternal Data    Feeding Mother's Current Feeding Choice: Breast Milk and Formula  LATCH Score                    Lactation Tools Discussed/Used Tools: Flanges;Pump Flange Size: 18;21 (MOB fitted with 21 mm breast flange for her left breast and 18 mm breast flange for her right breast.) Breast pump type: Double-Electric Breast Pump Pump Education: Setup, frequency, and cleaning;Milk Storage Reason for Pumping: MOB wants to pump only and formula feed infant for now Pumping frequency: MOB will continue to use the DEBP every 3 hours for 15 minutes  Interventions Interventions: DEBP;Education;CDC milk storage guidelines;CDC Guidelines for Breast Pump Cleaning;Guidelines for Milk Supply and Pumping Schedule Handout  Discharge Pump: Referral sent for [facility] Pump  Consult Status Consult Status: Follow-up Date: 04/06/24 Follow-up type: In-patient    Grayce LULLA Batter 04/06/2024, 12:39 AM

## 2024-04-07 ENCOUNTER — Other Ambulatory Visit (HOSPITAL_COMMUNITY): Payer: Self-pay

## 2024-04-07 MED ORDER — FAMOTIDINE 20 MG PO TABS
20.0000 mg | ORAL_TABLET | Freq: Two times a day (BID) | ORAL | 0 refills | Status: AC
  Filled 2024-04-07: qty 60, 30d supply, fill #0

## 2024-04-07 MED ORDER — FAMOTIDINE 20 MG PO TABS
20.0000 mg | ORAL_TABLET | Freq: Two times a day (BID) | ORAL | Status: DC
  Administered 2024-04-07: 20 mg via ORAL
  Filled 2024-04-07: qty 1

## 2024-04-07 NOTE — Patient Instructions (Addendum)
 Your appointment with Outpatient Lactation is: Date: Monday 04/20/2024 Time:8:30am [facility] (First Floor) [address] [place]  Check in under baby's name.  Please bring your baby hungry along with your pump and a bottle of either formula or expressed breast milk. Please also bring your pump flanges and we welcome support people! If you need lactation assistance before your appointment, please call [phone] for lactation voice mail.   --- [facility] Lactation Support Group  Please join us  for our [facility] [employer] Lact[facility] Support Group at [facility] [facility] We meet every Tuesday at 10:00 am to 12:00 pm at [employer] on the second floor in the conference room Lactating parents and lap babies are welcome, no registration is required, if you have a lactation pillow please bring it with you. Call with any lactation questions or concerns at [phone].

## 2024-04-07 NOTE — Progress Notes (Signed)
 At 0500, patient informed this LPN that she had an hypoglycemic episode overnight, around 0300. Stated her blood sugar was 50 but did not feel the need to call out. Patient stated once she ate snacks and drank cranberry juice her sugar increased to 70 at 0400.   LPN educated patient on the importance of informing her nurse when her blood sugar drops. Patient expressed understanding. CBG 109 @0600 .

## 2024-04-09 ENCOUNTER — Other Ambulatory Visit

## 2024-04-09 ENCOUNTER — Ambulatory Visit: Admitting: Advanced Practice Midwife

## 2024-04-10 ENCOUNTER — Telehealth: Payer: Self-pay | Admitting: *Deleted

## 2024-04-10 NOTE — Telephone Encounter (Signed)
 Pt states that when she got her epidural she noticed a popping sensation and her leg still has a numb area. I talked with Dr. Ozan, she said this is a normal occurrence and it can take a while for sensation to come back. Pt reassured and will call back with any other questions or concerns.

## 2024-04-12 ENCOUNTER — Encounter: Payer: Self-pay | Admitting: *Deleted

## 2024-04-12 ENCOUNTER — Encounter: Payer: Self-pay | Admitting: Obstetrics and Gynecology

## 2024-04-13 ENCOUNTER — Encounter: Admitting: Women's Health

## 2024-04-13 ENCOUNTER — Other Ambulatory Visit

## 2024-04-13 ENCOUNTER — Encounter: Admitting: Advanced Practice Midwife

## 2024-04-15 ENCOUNTER — Encounter: Admitting: Advanced Practice Midwife

## 2024-04-15 ENCOUNTER — Telehealth (HOSPITAL_COMMUNITY): Payer: Self-pay | Admitting: *Deleted

## 2024-04-15 NOTE — Telephone Encounter (Signed)
 04/15/2024  Name: Consuela Widener Selner MRN: [id number] DOB: March 29, 1990  Reason for Call:  Transition of Care Hospital Discharge Call  Contact Status: Patient Contact Status: Message  Language assistant needed:          Follow-Up Questions:    Van Postnatal Depression Scale:  In the Past 7 Days:    PHQ2-9 Depression Scale:     Discharge Follow-up:    Post-discharge interventions: NA  Mliss Sieve, RN 04/15/2024 14:48

## 2024-04-16 ENCOUNTER — Other Ambulatory Visit

## 2024-05-12 ENCOUNTER — Ambulatory Visit: Admitting: Women's Health
